# Patient Record
Sex: Female | Born: 1950
Health system: Southern US, Community
[De-identification: ages and names within clinical notes are randomized; demographics above are authoritative.]

## PROBLEM LIST (undated history)

## (undated) DIAGNOSIS — E785 Hyperlipidemia, unspecified: Secondary | ICD-10-CM

## (undated) DIAGNOSIS — I10 Essential (primary) hypertension: Secondary | ICD-10-CM

## (undated) DIAGNOSIS — G8929 Other chronic pain: Secondary | ICD-10-CM

## (undated) DIAGNOSIS — M5432 Sciatica, left side: Secondary | ICD-10-CM

## (undated) DIAGNOSIS — M5416 Radiculopathy, lumbar region: Secondary | ICD-10-CM

## (undated) DIAGNOSIS — M48061 Spinal stenosis, lumbar region without neurogenic claudication: Secondary | ICD-10-CM

## (undated) HISTORY — DX: Hyperlipidemia, unspecified: E78.5

## (undated) HISTORY — DX: Other chronic pain: G89.29

## (undated) HISTORY — DX: Essential (primary) hypertension: I10

## (undated) HISTORY — DX: Sciatica, left side: M54.32

## (undated) HISTORY — DX: Spinal stenosis, lumbar region without neurogenic claudication: M48.061

## (undated) HISTORY — DX: Radiculopathy, lumbar region: M54.16

---

## 2004-12-12 ENCOUNTER — Ambulatory Visit: Payer: Self-pay | Admitting: Obstetrics and Gynecology

## 2004-12-29 ENCOUNTER — Ambulatory Visit: Payer: Self-pay | Admitting: Obstetrics and Gynecology

## 2006-01-14 ENCOUNTER — Ambulatory Visit: Payer: Self-pay | Admitting: Internal Medicine

## 2007-03-24 ENCOUNTER — Ambulatory Visit: Payer: Self-pay | Admitting: Internal Medicine

## 2008-03-27 ENCOUNTER — Ambulatory Visit: Payer: Self-pay | Admitting: Internal Medicine

## 2008-08-03 ENCOUNTER — Ambulatory Visit: Payer: Self-pay | Admitting: Unknown Physician Specialty

## 2008-08-03 LAB — HM COLONOSCOPY

## 2009-04-04 ENCOUNTER — Ambulatory Visit: Payer: Self-pay | Admitting: Family Medicine

## 2009-04-15 ENCOUNTER — Ambulatory Visit: Payer: Self-pay | Admitting: Family Medicine

## 2009-04-15 LAB — HM MAMMOGRAPHY

## 2009-07-08 DIAGNOSIS — I10 Essential (primary) hypertension: Secondary | ICD-10-CM | POA: Insufficient documentation

## 2010-05-28 ENCOUNTER — Ambulatory Visit: Payer: Self-pay | Admitting: Family Medicine

## 2012-10-29 ENCOUNTER — Ambulatory Visit: Payer: Self-pay | Admitting: General Practice

## 2012-11-15 ENCOUNTER — Ambulatory Visit: Payer: Self-pay | Admitting: Unknown Physician Specialty

## 2012-11-15 DIAGNOSIS — I10 Essential (primary) hypertension: Secondary | ICD-10-CM

## 2012-11-18 ENCOUNTER — Ambulatory Visit: Payer: Self-pay | Admitting: General Practice

## 2013-07-12 ENCOUNTER — Ambulatory Visit: Payer: Self-pay | Admitting: General Practice

## 2013-07-12 DIAGNOSIS — I10 Essential (primary) hypertension: Secondary | ICD-10-CM

## 2013-07-12 LAB — URINALYSIS, COMPLETE
BILIRUBIN, UR: NEGATIVE
Blood: NEGATIVE
GLUCOSE, UR: NEGATIVE mg/dL (ref 0–75)
Hyaline Cast: 41
KETONE: NEGATIVE
NITRITE: NEGATIVE
Ph: 5 (ref 4.5–8.0)
Protein: NEGATIVE
SPECIFIC GRAVITY: 1.019 (ref 1.003–1.030)
Squamous Epithelial: 3
Transitional Epi: 2
WBC UR: 27 /HPF (ref 0–5)

## 2013-07-12 LAB — CBC
HCT: 44.5 % (ref 35.0–47.0)
HGB: 14.7 g/dL (ref 12.0–16.0)
MCH: 32.4 pg (ref 26.0–34.0)
MCHC: 33 g/dL (ref 32.0–36.0)
MCV: 98 fL (ref 80–100)
Platelet: 301 10*3/uL (ref 150–440)
RBC: 4.53 10*6/uL (ref 3.80–5.20)
RDW: 14 % (ref 11.5–14.5)
WBC: 9.8 10*3/uL (ref 3.6–11.0)

## 2013-07-12 LAB — BASIC METABOLIC PANEL
ANION GAP: 6 — AB (ref 7–16)
BUN: 17 mg/dL (ref 7–18)
CALCIUM: 9.7 mg/dL (ref 8.5–10.1)
CHLORIDE: 106 mmol/L (ref 98–107)
CO2: 29 mmol/L (ref 21–32)
Creatinine: 1.03 mg/dL (ref 0.60–1.30)
GFR CALC NON AF AMER: 58 — AB
Glucose: 87 mg/dL (ref 65–99)
Osmolality: 282 (ref 275–301)
Potassium: 4.4 mmol/L (ref 3.5–5.1)
SODIUM: 141 mmol/L (ref 136–145)

## 2013-07-12 LAB — APTT: Activated PTT: 26.8 secs (ref 23.6–35.9)

## 2013-07-12 LAB — MRSA PCR SCREENING

## 2013-07-12 LAB — PROTIME-INR
INR: 0.9
Prothrombin Time: 12.3 secs (ref 11.5–14.7)

## 2013-07-12 LAB — SEDIMENTATION RATE: Erythrocyte Sed Rate: 13 mm/hr (ref 0–30)

## 2013-07-14 LAB — URINE CULTURE

## 2013-07-24 ENCOUNTER — Inpatient Hospital Stay: Payer: Self-pay | Admitting: General Practice

## 2013-07-24 HISTORY — PX: REPLACEMENT TOTAL KNEE: SUR1224

## 2013-07-25 LAB — BASIC METABOLIC PANEL
ANION GAP: 6 — AB (ref 7–16)
BUN: 16 mg/dL (ref 7–18)
CALCIUM: 8.1 mg/dL — AB (ref 8.5–10.1)
CHLORIDE: 107 mmol/L (ref 98–107)
CREATININE: 0.88 mg/dL (ref 0.60–1.30)
Co2: 23 mmol/L (ref 21–32)
EGFR (African American): >60
GLUCOSE: 127 mg/dL — AB (ref 65–99)
OSMOLALITY: 275 (ref 275–301)
Potassium: 4.1 mmol/L (ref 3.5–5.1)
SODIUM: 136 mmol/L (ref 136–145)

## 2013-07-25 LAB — PLATELET COUNT: PLATELETS: 193 10*3/uL (ref 150–440)

## 2013-07-25 LAB — HEMOGLOBIN: HGB: 11.4 g/dL — ABNORMAL LOW (ref 12.0–16.0)

## 2013-07-26 LAB — BASIC METABOLIC PANEL
Anion Gap: 6 — ABNORMAL LOW (ref 7–16)
BUN: 11 mg/dL (ref 7–18)
CHLORIDE: 104 mmol/L (ref 98–107)
CREATININE: 0.6 mg/dL (ref 0.60–1.30)
Calcium, Total: 8.1 mg/dL — ABNORMAL LOW (ref 8.5–10.1)
Co2: 25 mmol/L (ref 21–32)
EGFR (African American): >60
EGFR (Non-African Amer.): >60
Glucose: 105 mg/dL — ABNORMAL HIGH (ref 65–99)
Osmolality: 270 (ref 275–301)
Potassium: 4.4 mmol/L (ref 3.5–5.1)
Sodium: 135 mmol/L — ABNORMAL LOW (ref 136–145)

## 2013-07-26 LAB — PLATELET COUNT: Platelet: 200 10*3/uL (ref 150–440)

## 2013-07-26 LAB — HEMOGLOBIN: HGB: 10.7 g/dL — ABNORMAL LOW (ref 12.0–16.0)

## 2014-03-09 LAB — HEMOGLOBIN A1C: Hgb A1c MFr Bld: 5.9 % (ref 4.0–6.0)

## 2014-03-09 LAB — BASIC METABOLIC PANEL
BUN: 13 mg/dL (ref 4–21)
Creatinine: 0.7 mg/dL (ref <=1.1)
GLUCOSE: 126 mg/dL
POTASSIUM: 4.3 mmol/L (ref 3.4–5.3)
SODIUM: 139 mmol/L (ref 137–147)

## 2014-05-03 LAB — LIPID PANEL
Cholesterol: 249 mg/dL — AB (ref 0–200)
HDL: 61 mg/dL (ref 35–70)
LDL CALC: 126 mg/dL
TRIGLYCERIDES: 311 mg/dL — AB (ref 40–160)

## 2014-05-03 LAB — HEPATIC FUNCTION PANEL: ALT: 13 U/L (ref 7–35)

## 2014-05-18 NOTE — Op Note (Signed)
PATIENT NAME:  Angela Winters, Angela Winters MR#:  [id number] DATE OF BIRTH:  1950/07/31  DATE OF PROCEDURE:  11/18/2012  PREOPERATIVE DIAGNOSIS: Internal derangement of the left knee.   POSTOPERATIVE DIAGNOSES: 1.  Tear of the posterior horn medial meniscus, left knee.  2.  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   PROCEDURE PERFORMED: Left knee arthroscopy, partial medial meniscectomy and chondroplasty of the medial and lateral compartments.   SURGEON: Laurice Record. Holley Bouche., M.D.   ANESTHESIA: General.   ESTIMATED BLOOD LOSS: Minimal.   FLUIDS REPLACED: 1000 mL of crystalloid.   TOURNIQUET TIME: Not used.   DRAINS: None.   INDICATIONS FOR SURGERY: The patient is a 64 year old female who has been seen for planes of progressive left knee pain. MRI demonstrated findings consistent with meniscal pathology, as well as some thinning of the articular cartilage. After discussion of the risks and benefits of surgical intervention, the patient expressed understanding of the risks and benefits and agreed with plans for surgical intervention.   PROCEDURE IN DETAIL: The patient was brought to the operating room and, after adequate general anesthesia was achieved, a tourniquet was placed on the patient's left thigh. The leg was placed in a leg holder and all bony prominences were well padded. The patient's left knee and leg were cleaned and prepped with alcohol and DuraPrep. draped in the usual sterile fashion. A "timeout" was performed as per usual protocol. The anticipated portal sites were injected with 0.25% Marcaine with epinephrine. An anterolateral portal was created and a cannula was inserted. A moderate effusion was evacuated. The scope was inserted and the knee was distended with fluid using the Stryker pump. The scope was advanced down the medial gutter into the medial compartment of the knee. Under  visualization with the scope, an anteromedial portal was created and hook probe was inserted. Inspection of the medial compartment demonstrated degenerative tear involving the posterior horn of the medial meniscus. The tear was debrided using meniscal punches and a 4.5 mm incisor shaver. Final contouring was performed using an ArthroCare wand. Inspection of the articular cartilage demonstrated grade 4 chondromalacia involving portion of the medial tibial plateau as well as the medial aspect of the medial femoral condyle. The margins were debrided and contoured using the ArthroCare wand. The scope was then advanced into the intracondylar region. The anterior cruciate ligament was visualized and probed and felt to be stable. The scope was removed from the anterolateral portal and reinserted via the anteromedial portal so as to better visualize the lateral compartment. The lateral meniscus was visualized and probed and felt to stable. The articular cartilage was inspected and there was an area of grade 3 chondromalacia involving a localized area of the lateral tibial plateau. This area was debrided and contoured using the ArthroCare wand. Finally, the scope was positioned so as to visualize the patellofemoral articulation. Good patellar tracking was noted. The articular surface was in reasonably good condition.   The knee was irrigated with copious amounts of fluid and then suctioned dry. The anterolateral portal was reapproximated using #3-0 nylon. A combination of 0.25% Marcaine with epinephrine and 4 mg morphine was injected via the scope. The scope was removed and the anteromedial portal was reapproximated using #3-0 nylon. Sterile dressing was applied followed by application of an ice wrap. The patient tolerated the procedure well. She was transported to the recovery room in stable condition.  ____________________________ Laurice Record. Holley Bouche., MD jph:NTS D: 11/18/2012 23:06:25 ET T: 11/19/2012 00:00:59  ET JOB#: [id number]  cc: Laurice Record. Holley Bouche., MD, <Dictator> Laurice Record Holley Bouche MD ELECTRONICALLY SIGNED 11/19/2012 11:41

## 2014-05-19 NOTE — Discharge Summary (Signed)
PATIENT NAME:  Angela Winters, Angela Winters MR#:  [id number] DATE OF BIRTH:  08/13/1950  DATE OF ADMISSION:  07/24/2013 DATE OF DISCHARGE:  07/27/2013  ADMITTING DIAGNOSIS: Degenerative arthrosis of the left knee.   DISCHARGE DIAGNOSIS: Degenerative arthrosis of the left knee.   HISTORY: The patient is a 64 year old female who has been followed at [facility] for progression of left knee discomfort. She has undergone arthroscopic surgery for medial meniscectomy as well as chondroplasty in October 2001. Following surgery she was placed on anti-inflammatories, Tylenol and ice heat as well as undergone a series of Synvisc injections. She had not seen any improvement in her condition. She had also modified her activities of daily living because of the increased pain. She states that the pain had increased to the point that it was significantly interfering with her activities of daily living. X-rays taken in [place] showed a tricompartmental fashion of degenerative changes with relative varus alignment. After discussion of the risks and benefits of surgical intervention, the patient expressed her understanding of the risks and benefits and agreed with plans for surgical intervention.   PROCEDURE: Left total knee arthroplasty using computer-assisted navigation.   ANESTHESIA: Spinal.   SOFT TISSUE RELEASE: Anterior cruciate ligament, posterior cruciate ligament, deep and superficial medial collateral ligament, and patellofemoral ligament.   IMPLANTS UTILIZED: DePuy PFC Sigma size 3 posterior stabilized femoral component (cemented), size 3 MBT tibial component (cemented), 35 mm three-pegged oval dome patella (cemented), and a 12.5 mm stabilized rotating platform polyethylene.   HOSPITAL COURSE: The patient tolerated the procedure very well. She had no complications. She was then taken to the PAC-U where she was stabilized and then transferred to the orthopedic floor. She began receiving  anticoagulation therapy of Lovenox 30 mg subcu every 12 hours per anesthesia and pharmacy protocol. She was fitted with TED stockings bilaterally. These were allowed to be removed 1 hour per 8 hour shift. She was also fitted with the AV-I compression foot pumps bilaterally set at 80 mmHg. Her calves have been nontender. There has been no evidence of any DVTs. Negative Homans sign. Heels were elevated off the bed using rolled towels.   The patient has denied any chest pain or shortness of breath. Vital signs have been stable. She has been afebrile. Hemodynamically she was stable and no transfusions were given other than the Autovac transfusions given the first 6 hours postoperatively.   Physical therapy was initiated on day 1 for gait training and transfers. She has done extremely well. Upon being discharged was ambulating greater than 500 feet. She had actually even gone to using a single-point cane for ambulation. She was able to go up and down 4 steps. She was independent with bed to chair transfers. She was actually walking around her room independently. Occupational therapy was also initiated on day 1 for ADL and assistive devices.   The patient's IV, Foley and Hemovac were DC'd on day 2 along with a dressing change. The wound was free of any drainage or any signs of infection. Polar Care was reapplied to the surgical leg maintaining a temperature of 40 to 50 degrees Fahrenheit.   DRUG ALLERGIES: CODEINE.   DISPOSITION: The patient is discharged to home in improved stable condition.   DISCHARGE INSTRUCTIONS: She may weight bear as tolerated. Continue using a walker until cleared by physical therapy to go to a quad cane. She will receive home health PT. Continue with TED stockings. These are to be worn during the day but may be  removed at night.  Also continue with Polar Care maintaining a temperature of 40 to 50 degrees Fahrenheit. I have asked her to use this around-the-clock for the first 2 weeks.  She is placed on a regular diet. She is instructed on wound care. She is not to get the wound wet until staples are removed in 2 weeks.  She has a follow-up appointment on July 14 at 9:15. She is to call sooner for any temperatures of 101.5 or greater or excessive bleeding.   DISCHARGE MEDICATIONS: The patient was placed on oxycodone 5 to 10 mg every 4 to 6 hours p.r.n. for pain, Ultram 50 to 100 mg every 4 to 6 hours p.r.n. for pain, as well as Lovenox 40 mg subcu daily for 14 days and then discontinue and begin taking one 81 mg enteric-coated aspirin. She may resume her regular medication that she was on prior to admission.   PAST MEDICAL HISTORY: 1.  Hypertension.  2.  Hyperlipidemia.  3.  Anxiety.   ____________________________ Vance Peper, PA jrw:sb D: 07/27/2013 07:10:33 ET T: 07/27/2013 11:00:09 ET JOB#: [id number]  cc: Vance Peper, PA, <Dictator> Dali Kraner PA ELECTRONICALLY SIGNED 08/02/2013 7:23

## 2014-05-19 NOTE — Op Note (Signed)
PATIENT NAME:  Angela Winters, Angela Winters MR#:  [id number] DATE OF BIRTH:  August 19, 1950  DATE OF PROCEDURE:  07/24/2013  PREOPERATIVE DIAGNOSIS: Degenerative arthrosis of the left knee.   POSTOPERATIVE DIAGNOSIS: Degenerative arthrosis of the left knee.   PROCEDURE PERFORMED: Left total knee arthroplasty using computer assisted navigation..   SURGEON: Skip Estimable, M.D.   ASSISTANT: Vance Peper, PA (required to maintain retraction throughout the procedure).   ANESTHESIA: Spinal.   ESTIMATED BLOOD LOSS: 50 mL.   FLUIDS REPLACED: 800 mL of crystalloid.   TOURNIQUET TIME: 72 minutes.   DRAINS: Two medium drains to reinfusion system.   SOFT TISSUE RELEASES: Anterior cruciate ligament, posterior cruciate ligament, deep and superficial medial collateral ligament, and patellofemoral ligament.   IMPLANTS UTILIZED: DePuy PFC Sigma size 3 posterior stabilized femoral component (cemented), size 3 MBT tibial component (cemented), 35 mm three-peg oval dome patella (cemented), and a 12.5 mm stabilized rotating platform polyethylene insert.   INDICATIONS FOR SURGERY: The patient is a 64 year old female who has been seen for complaints of progressive left knee pain. X-rays demonstrated degenerative changes in tricompartmental fashion with relative varus deformity. After discussion of the risks and benefits of surgical intervention, the patient expressed understanding of the risks and benefits and agreed with plans for surgical intervention.   PROCEDURE IN DETAIL: The patient was brought to the operating room and, after adequate spinal anesthesia was achieved, a tourniquet was placed on the patient's upper left thigh. The patient's left knee and leg were cleaned and prepped with alcohol and DuraPrep and draped in the usual sterile fashion. A "timeout" was performed as per usual protocol. The left lower extremity was exsanguinated using an Esmarch, and the tourniquet was inflated to 300 mmHg. An anterior longitudinal  incision was made followed by a standard mid vastus approach. A moderate effusion was evacuated. The deep fibers of the medial collateral ligament were elevated in subperiosteal fashion off the medial flare of the tibia so as to maintain a continuous soft tissue sleeve. The patella was subluxed laterally and the patellofemoral ligament was incised. Inspection of the knee demonstrated severe degenerative changes, most notably to the medial compartment. Prominent osteophytes were debrided using a rongeur. The anterior and posterior cruciate ligaments were excised. Two 4.0 mm Schanz pins were inserted into the femur and into the tibia for attachment of the array of trackers used for computer-assisted navigation. Hip center was identified using a circumduction technique. Distal landmarks were mapped using the computer. The distal femur and proximal tibia were mapped using the computer. Distal femoral cutting guide was positioned using computer-assisted navigation so as to achieve a 5 degree distal valgus cut. Cut was performed and verified using the computer. Distal femur was sized and it was felt that a size 3 femoral component was appropriate. A size 3 cutting guide was positioned and the cut was performed and verified using the computer. This was followed by completion of the posterior and chamfer cuts. Femoral cutting guide for the central box was then positioned, and the central box cut was performed. Attention was then directed to the proximal tibia. Medial and lateral menisci were excised. The extramedullary tibial cutting guide was positioned using computer-assisted navigation so as to achieve a 0 degree varus valgus alignment and 0 degree posterior slope. Cut was performed and verified using the computer. The proximal tibia was sized and it was felt that a size 3 tibial tray was appropriate. Tibial and femoral trials were inserted followed by insertion of a 10 mm polyethylene insert.  The knee was felt to be  tight medially. A Cobb elevator was used to elevate the superficial fibers of the medial collateral ligament. The 10 mm polyethylene trial was replaced with a 12.5 mm polyethylene. This allowed for excellent mediolateral soft tissue balancing. Finally, the patella was cut and prepared so as to accommodate a 35 mm three-peg oval dome patella. Patellar trial was placed and the knee was placed through a range of motion with excellent patellar tracking appreciated. The femoral trials were removed. Central post hole for the tibial component was reamed followed by insertion of a keel punch. Tibial trial was then removed. The cut surfaces of bone were irrigated with copious amounts of normal saline with antibiotic solution using pulsatile lavage and then suctioned dry. Polymethyl methacrylate cement was prepared in the usual fashion using a vacuum mixer. Cement was applied to the cut surface of the proximal tibia as well as along the undersurface of a size 3 MBT tibial component. The tibial component was positioned and impacted into place. Excess cement was removed using [employer] fast streamer. Cement was then applied to the cut surface of the femur as well as on the posterior flanges of a size 3 posterior stabilized femoral component. Femoral component was positioned and impacted into place. Excess cement was removed using freer elevators. A 12.5 mm polyethylene trial was inserted and the knee was brought into full extension with steady axial compression applied. Finally, cement was applied to the backside of a 35 mm three-peg oval dome patella and the patellar component was positioned and patellar clamp applied. Excess cement was removed using [employer] fast streamer.   After adequate curing of the cement, the tourniquet was deflated after total tourniquet time of 72 minutes. Hemostasis was achieved using electrocautery. The knee was irrigated with copious amounts of normal saline with antibiotic solution using pulsatile lavage and  then suctioned dry. The knee was inspected for any residual cement debris. Then 20 mL of 1.3% Exparel in 40 mL of normal saline was injected along the posterior capsule, medial and lateral gutters, and along the arthrotomy site. A 12.5 mm stabilized rotating platform polyethylene insert was inserted and the knee was placed through a range of motion with excellent mediolateral soft tissue balancing and excellent patellar tracking appreciated. Two medium drains were placed in the wound bed and brought out through a separate stab incision to be attached to a reinfusion system. The medial parapatellar portion of the incision was reapproximated using interrupted sutures of #1 Vicryl. The subcutaneous tissue was approximated in layers using first #0 Vicryl followed by #2-0 Vicryl. Then 30 mL of 0.25% Marcaine with epinephrine was injected into the subcutaneous tissue along the incision site. Skin was then approximated using skin staples. A sterile dressing was applied.   The patient tolerated the procedure well. She was transported to the recovery room in stable condition.   ____________________________ Laurice Record. Holley Bouche., MD jph:sb D: 07/25/2013 00:47:14 ET T: 07/25/2013 07:32:33 ET JOB#: [id number]  cc: Laurice Record. Holley Bouche., MD, <Dictator> JAMES P Holley Bouche MD ELECTRONICALLY SIGNED 07/27/2013 7:47

## 2014-07-08 DIAGNOSIS — Z96659 Presence of unspecified artificial knee joint: Secondary | ICD-10-CM | POA: Insufficient documentation

## 2014-07-10 LAB — HM COLONOSCOPY

## 2014-07-25 DIAGNOSIS — M5416 Radiculopathy, lumbar region: Secondary | ICD-10-CM | POA: Insufficient documentation

## 2014-07-25 DIAGNOSIS — R7303 Prediabetes: Secondary | ICD-10-CM | POA: Insufficient documentation

## 2014-07-25 DIAGNOSIS — J309 Allergic rhinitis, unspecified: Secondary | ICD-10-CM | POA: Insufficient documentation

## 2014-07-25 DIAGNOSIS — M5432 Sciatica, left side: Secondary | ICD-10-CM | POA: Insufficient documentation

## 2014-07-25 DIAGNOSIS — R739 Hyperglycemia, unspecified: Secondary | ICD-10-CM | POA: Insufficient documentation

## 2014-07-25 DIAGNOSIS — E119 Type 2 diabetes mellitus without complications: Secondary | ICD-10-CM | POA: Insufficient documentation

## 2014-07-25 DIAGNOSIS — E1169 Type 2 diabetes mellitus with other specified complication: Secondary | ICD-10-CM | POA: Insufficient documentation

## 2014-08-02 ENCOUNTER — Encounter: Payer: Self-pay | Admitting: Family Medicine

## 2014-08-02 ENCOUNTER — Ambulatory Visit (INDEPENDENT_AMBULATORY_CARE_PROVIDER_SITE_OTHER): Payer: Medicare Other | Admitting: Family Medicine

## 2014-08-02 VITALS — BP 120/70 | HR 70 | Temp 98.4°F | Resp 16 | Ht 67.0 in | Wt 184.0 lb

## 2014-08-02 DIAGNOSIS — R739 Hyperglycemia, unspecified: Secondary | ICD-10-CM

## 2014-08-02 DIAGNOSIS — I10 Essential (primary) hypertension: Secondary | ICD-10-CM

## 2014-08-02 DIAGNOSIS — H539 Unspecified visual disturbance: Secondary | ICD-10-CM

## 2014-08-02 DIAGNOSIS — G44219 Episodic tension-type headache, not intractable: Secondary | ICD-10-CM

## 2014-08-02 DIAGNOSIS — E7800 Pure hypercholesterolemia, unspecified: Secondary | ICD-10-CM

## 2014-08-02 NOTE — Progress Notes (Signed)
Patient: Angela Winters Female    DOB: [birth year]   64 y.o.   MRN: [id number] Visit Date: 08/02/2014  Today's Provider: Lelon Huh, MD   Chief Complaint  Patient presents with  . Hypertension    follow up  . Hyperlipidemia    follow up  . Hyperglycemia    follow up   Subjective:    HPI  Lipid/Cholesterol, Follow-up:   Last seen for this3 months ago.  Management changes since that visit include  Increasing Pravastatin to 80mg  daily. . Last Lipid Panel:    Component Value Date/Time   CHOL 249* 05/03/2014   TRIG 311* 05/03/2014   HDL 61 05/03/2014   LDLCALC 126 05/03/2014    Risk factors for vascular disease include hypercholesterolemia, hypertension and smoking  She reports good compliance with treatment. She is not having side effects.  Current symptoms include none and have been stable. Weight trend: stable Prior visit with dietician: no Current diet: Unhealthy Current exercise: none  Wt Readings from Last 3 Encounters:  08/02/14 184 lb (83.462 kg)  05/03/14 184 lb (83.462 kg)    -------------------------------------------------------------------  Hypertension, follow-up:  BP Readings from Last 3 Encounters:  08/02/14 120/70  05/03/14 124/60    She was last seen for hypertension 5 months ago.  BP at that visit was 110/60. Management changes since that visit include  none. She reports good compliance with treatment. She is not having side effects.  She is not exercising. She is not adherent to low salt diet.   Outside blood pressures are not taken. She is experiencing none.  Patient denies chest pain, chest pressure/discomfort, claudication, dyspnea, exertional chest pressure/discomfort, fatigue, irregular heart beat, lower extremity edema, near-syncope, orthopnea, palpitations, paroxysmal nocturnal dyspnea, syncope and tachypnea.   Cardiovascular risk factors include hypertension and smoking/ tobacco exposure.  Use of agents associated with  hypertension: none.     Weight trend: stable Wt Readings from Last 3 Encounters:  08/02/14 184 lb (83.462 kg)  05/03/14 184 lb (83.462 kg)    Current diet: in general, an "unhealthy" diet  ------------------------------------------------------------------------   Hyperglycemia Follow up:   Last office visit was 03/09/2014 and no changes were made. Patient states she has had trouble watching her diet due to holiday celebrations and special occasions. Patient states her last eye exam, was 5 years ago. Patient  has had trouble focusing in her left eye. Patient states she has had 2-3 episode of this in the past 3 months.      Previous Medications   ASPIRIN 81 MG TABLET    Take by mouth.   BISOPROLOL (ZEBETA) 10 MG TABLET    Take by mouth.   CHOLECALCIFEROL (VITAMIN D) 1000 UNITS TABLET    VITAMIN D, 1000UNIT (Oral Tablet)  1 Every Day for 0 days  Quantity: 0.00;  Refills: 0   Ordered :09-Jan-2010  Darlin Priestly ;  Started 11-Sep-2008 Active   CITALOPRAM (CELEXA) 40 MG TABLET    Take by mouth.   LISINOPRIL (PRINIVIL,ZESTRIL) 10 MG TABLET    Take by mouth.   MULTIPLE VITAMIN PO    1 tablet daily.    PRAVASTATIN (PRAVACHOL) 80 MG TABLET    Take by mouth.    Review of Systems  Constitutional: Negative for fever, chills and fatigue.  HENT: Negative for ear pain.   Eyes: Positive for visual disturbance (left eye has touble focusing for the past 3 months; occured 2-3 times).  Neurological: Positive for headaches (on the left  side of back of her head occasionally, not related to visual disturbances).  All other systems reviewed and are negative.   History  Substance Use Topics  . Smoking status: Current Every Day Smoker -- 1.00 packs/day for 50 years  . Smokeless tobacco: Not on file  . Alcohol Use: Yes     Comment: Occasional alcohol use   Objective:   BP 120/70 mmHg  Pulse 70  Temp(Src) 98.4 F (36.9 C) (Oral)  Resp 16  Ht 5\' 7"  (1.702 m)  Wt 184 lb (83.462 kg)  BMI 28.81  kg/m2  SpO2 95%  Physical Exam   General Appearance:    Alert, cooperative, no distress  Eyes:    PERRL, conjunctiva/corneas clear, EOM's intact       Lungs:     Clear to auscultation bilaterally, respirations unlabored  Heart:    Regular rate and rhythm  Neurologic:   Awake, alert, oriented x 3. No apparent focal neurological           defect.            Assessment & Plan:     1. Hyperglycemia Not following diet well, counseled on improving diet.  - Hemoglobin A1c  2. Hypercholesterolemia without hypertriglyceridemia She is tolerating increased dose of pravastatin well with no adverse effects.   - Lipid panel - Hepatic function panel  3. Essential (primary) hypertension well controlled Continue current medications.    4. Visual disturbances  - Ambulatory referral to Ophthalmology  5. Episodic tension-type headache, not intractable Try OC Tylenol and/or cool compress. Call if they become more frequent or change in character.      Lelon Huh, MD  [facility]

## 2014-08-03 LAB — HEPATIC FUNCTION PANEL
ALBUMIN: 4.1 g/dL (ref 3.6–4.8)
ALT: 13 IU/L (ref 0–32)
AST: 16 IU/L (ref 0–40)
Alkaline Phosphatase: 80 IU/L (ref 39–117)
BILIRUBIN TOTAL: 0.2 mg/dL (ref 0.0–1.2)
BILIRUBIN, DIRECT: 0.07 mg/dL (ref 0.00–0.40)
TOTAL PROTEIN: 6.8 g/dL (ref 6.0–8.5)

## 2014-08-03 LAB — LIPID PANEL
Chol/HDL Ratio: 3.8 ratio units (ref 0.0–4.4)
Cholesterol, Total: 229 mg/dL — ABNORMAL HIGH (ref 100–199)
HDL: 60 mg/dL (ref >39)
LDL Calculated: 116 mg/dL — ABNORMAL HIGH (ref 0–99)
Triglycerides: 267 mg/dL — ABNORMAL HIGH (ref 0–149)
VLDL Cholesterol Cal: 53 mg/dL — ABNORMAL HIGH (ref 5–40)

## 2014-08-03 LAB — HEMOGLOBIN A1C
Est. average glucose Bld gHb Est-mCnc: 123 mg/dL
Hgb A1c MFr Bld: 5.9 % — ABNORMAL HIGH (ref 4.8–5.6)

## 2014-08-08 ENCOUNTER — Other Ambulatory Visit: Payer: Self-pay | Admitting: Family Medicine

## 2014-08-08 ENCOUNTER — Encounter: Payer: Self-pay | Admitting: Family Medicine

## 2014-08-08 DIAGNOSIS — Z87891 Personal history of nicotine dependence: Secondary | ICD-10-CM

## 2014-08-08 DIAGNOSIS — F17210 Nicotine dependence, cigarettes, uncomplicated: Secondary | ICD-10-CM | POA: Insufficient documentation

## 2014-08-09 ENCOUNTER — Other Ambulatory Visit: Payer: Self-pay | Admitting: Family Medicine

## 2014-08-09 DIAGNOSIS — Z87891 Personal history of nicotine dependence: Secondary | ICD-10-CM

## 2014-08-10 ENCOUNTER — Inpatient Hospital Stay: Payer: Medicare Other | Attending: Family Medicine | Admitting: Family Medicine

## 2014-08-10 ENCOUNTER — Ambulatory Visit
Admission: RE | Admit: 2014-08-10 | Discharge: 2014-08-10 | Disposition: A | Discharge status: Home / Self Care | Payer: Medicare Other | Source: Ambulatory Visit | Attending: Family Medicine | Admitting: Family Medicine

## 2014-08-10 DIAGNOSIS — D3502 Benign neoplasm of left adrenal gland: Secondary | ICD-10-CM | POA: Diagnosis not present

## 2014-08-10 DIAGNOSIS — Z87891 Personal history of nicotine dependence: Secondary | ICD-10-CM | POA: Insufficient documentation

## 2014-08-10 DIAGNOSIS — Z122 Encounter for screening for malignant neoplasm of respiratory organs: Secondary | ICD-10-CM

## 2014-08-10 DIAGNOSIS — I2510 Atherosclerotic heart disease of native coronary artery without angina pectoris: Secondary | ICD-10-CM | POA: Insufficient documentation

## 2014-08-10 NOTE — Progress Notes (Signed)
In accordance with CMS guidelines, patient has meet eligibility criteria including age, absence of signs or symptoms of lung cancer, the specific calculation of cigarette smoking pack-years was 35 years and is a current smoker.   A shared decision-making session was conducted prior to the performance of CT scan. This includes one or more decision aids, includes benefits and harms of screening, follow-up diagnostic testing, over-diagnosis, false positive rate, and total radiation exposure.  Counseling on the importance of adherence to annual lung cancer LDCT screening, impact of co-morbidities, and ability or willingness to undergo diagnosis and treatment is imperative for compliance of the program.  Counseling on the importance of continued smoking cessation for former smokers; the importance of smoking cessation for current smokers and information about tobacco cessation interventions have been given to patient including the [place] at [facility], 1800 quit [place], as well as [place] specific smoking cessation programs.  Written order for lung cancer screening with LDCT has been given to the patient and any and all questions have been answered to the best of my abilities.   Yearly follow up will be scheduled by Burgess Estelle, Thoracic Navigator.

## 2014-08-13 ENCOUNTER — Telehealth: Payer: Self-pay | Admitting: *Deleted

## 2014-08-13 NOTE — Telephone Encounter (Signed)
  Oncology Nurse Navigator Documentation    Navigator Encounter Type: Telephone;Screening (08/13/14 1300)               Notified patient of LDCT lung cancer screening results of Lung Rads 1  finding with recommendation for 12 month follow up imaging. Also notified of incidental finding noted below. Patient verbalizes understanding.   IMPRESSION: 1. Lung-RADS Category 1, negative. Continue annual screening with low-dose chest CT without contrast in 12 months. 2. Coronary artery calcification. 3. Left adrenal adenoma

## 2014-08-20 ENCOUNTER — Telehealth: Payer: Self-pay | Admitting: Emergency Medicine

## 2014-08-20 NOTE — Telephone Encounter (Signed)
-----   Message from Birdie Sons, MD sent at 08/18/2014  8:22 PM EDT ----- Regarding: Lung CT Lung CT is normal. Repeat yearly.

## 2014-08-20 NOTE — Telephone Encounter (Signed)
Pt informed

## 2014-08-20 NOTE — Telephone Encounter (Signed)
LMTCB

## 2014-09-25 ENCOUNTER — Other Ambulatory Visit: Payer: Self-pay | Admitting: Family Medicine

## 2014-10-03 ENCOUNTER — Other Ambulatory Visit: Payer: Self-pay | Admitting: Family Medicine

## 2014-10-06 ENCOUNTER — Other Ambulatory Visit: Payer: Self-pay | Admitting: Family Medicine

## 2014-12-17 ENCOUNTER — Other Ambulatory Visit: Payer: Self-pay | Admitting: *Deleted

## 2014-12-17 MED ORDER — PRAVASTATIN SODIUM 80 MG PO TABS: 80.0000 mg | ORAL_TABLET | Freq: Every day | ORAL | Status: DC

## 2014-12-17 NOTE — Telephone Encounter (Signed)
Requesting 90 day supply.

## 2015-01-07 ENCOUNTER — Other Ambulatory Visit: Payer: Self-pay | Admitting: *Deleted

## 2015-01-07 NOTE — Telephone Encounter (Signed)
Patient called back and scheduled ov appt for 01/30/2015.

## 2015-01-07 NOTE — Telephone Encounter (Signed)
Patient's husband Deidre Ala was notified that pt is due for f/u ov. Stated that he will have pt call back.

## 2015-01-08 MED ORDER — CITALOPRAM HYDROBROMIDE 40 MG PO TABS: 40.0000 mg | ORAL_TABLET | Freq: Every day | ORAL | Status: DC

## 2015-01-30 ENCOUNTER — Ambulatory Visit (INDEPENDENT_AMBULATORY_CARE_PROVIDER_SITE_OTHER): Payer: Medicare Other | Admitting: Family Medicine

## 2015-01-30 ENCOUNTER — Encounter: Payer: Self-pay | Admitting: Family Medicine

## 2015-01-30 VITALS — BP 112/66 | HR 60 | Temp 97.9°F | Resp 16 | Wt 186.0 lb

## 2015-01-30 DIAGNOSIS — Z23 Encounter for immunization: Secondary | ICD-10-CM | POA: Diagnosis not present

## 2015-01-30 DIAGNOSIS — I10 Essential (primary) hypertension: Secondary | ICD-10-CM | POA: Diagnosis not present

## 2015-01-30 DIAGNOSIS — R739 Hyperglycemia, unspecified: Secondary | ICD-10-CM | POA: Diagnosis not present

## 2015-01-30 LAB — POCT GLYCOSYLATED HEMOGLOBIN (HGB A1C)
ESTIMATED AVERAGE GLUCOSE: 128
HEMOGLOBIN A1C: 6.1

## 2015-01-30 NOTE — Progress Notes (Signed)
Patient: Angela Winters Female    DOB: Jun 08, 1950   65 y.o.   MRN: [id number] Visit Date: 01/30/2015  Today's Provider: Lelon Huh, MD   Chief Complaint  Patient presents with  . Hypertension    follow up  . Hyperglycemia    follow up  . Panic Attack    follow up   Subjective:    HPI   Hypertension, follow-up:  BP Readings from Last 3 Encounters:  01/30/15 112/66  08/02/14 120/70  05/03/14 124/60    She was last seen for hypertension 6 months ago.  BP at that visit was 120/70. Management since that visit includes no changes. She reports good compliance with treatment. She is not having side effects.  She is not exercising. She is not adherent to low salt diet.   Outside blood pressures are not being checked. She is experiencing none.  Patient denies chest pain, chest pressure/discomfort, claudication, dyspnea, exertional chest pressure/discomfort, fatigue, irregular heart beat, lower extremity edema, near-syncope, orthopnea, palpitations, paroxysmal nocturnal dyspnea, syncope and tachypnea.   Cardiovascular risk factors include advanced age (older than 88 for men, 32 for women), dyslipidemia, hypertension and sedentary lifestyle.  Use of agents associated with hypertension: NSAIDS.     Weight trend: stable Wt Readings from Last 3 Encounters:  01/30/15 186 lb (84.369 kg)  08/02/14 184 lb (83.462 kg)  05/03/14 184 lb (83.462 kg)    Current diet: in general, an "unhealthy" diet  ------------------------------------------------------------------------   Hyperglycemia, Follow-up:   Lab Results  Component Value Date   HGBA1C 5.9* 08/02/2014   HGBA1C 5.9 03/09/2014   GLUCOSE 105* 07/26/2013   GLUCOSE 127* 07/25/2013   GLUCOSE 87 07/12/2013    Last seen for for this 6 months ago.  Management since then includes recommend improving diet.  Has since stopped drinking soft drinks and trying to cut back on white breads.  Has started drinking unsweetened  tea.  Current symptoms include none and have been stable.  Weight trend: stable Prior visit with dietician: no Current diet: in general, an "unhealthy" diet Current exercise: none  Pertinent Labs:    Component Value Date/Time   CHOL 229* 08/02/2014 1008   CHOL 249* 05/03/2014   TRIG 267* 08/02/2014 1008   CHOLHDL 3.8 08/02/2014 1008   CREATININE 0.7 03/09/2014   CREATININE 0.60 07/26/2013 0530    Wt Readings from Last 3 Encounters:  01/30/15 186 lb (84.369 kg)  08/02/14 184 lb (83.462 kg)  05/03/14 184 lb (83.462 kg)   Follow up Panic disorder:  Last office visit was 6 months ago and no changes were made. Patient reports good compliance with treatment, good tolerance and good symptom control.      Allergies  Allergen Reactions  . Codeine Nausea Only   Previous Medications   ASPIRIN 81 MG TABLET    Take 81 mg by mouth daily.    BISOPROLOL (ZEBETA) 10 MG TABLET    Take 10 mg by mouth daily.    CHOLECALCIFEROL (VITAMIN D) 1000 UNITS TABLET    VITAMIN D, 1000UNIT (Oral Tablet)  1 Every Day for 0 days  Quantity: 0.00;  Refills: 0   Ordered :09-Jan-2010  Darlin Priestly ;  Started 11-Sep-2008 Active   CITALOPRAM (CELEXA) 40 MG TABLET    Take 1 tablet (40 mg total) by mouth daily.   LISINOPRIL (PRINIVIL,ZESTRIL) 10 MG TABLET    Take 10 mg by mouth daily.    MULTIPLE VITAMIN PO    1 tablet  daily.    PRAVASTATIN (PRAVACHOL) 80 MG TABLET    Take 1 tablet (80 mg total) by mouth daily.    Review of Systems  Constitutional: Negative for fever, chills, appetite change and fatigue.  Respiratory: Negative for chest tightness and shortness of breath.   Cardiovascular: Negative for chest pain and palpitations.  Gastrointestinal: Negative for nausea, vomiting and abdominal pain.  Neurological: Negative for dizziness and weakness.    Social History  Substance Use Topics  . Smoking status: Current Every Day Smoker -- 1.00 packs/day for 50 years  . Smokeless tobacco: Not on file  .  Alcohol Use: Yes     Comment: Occasional alcohol use   Objective:   BP 112/66 mmHg  Pulse 60  Temp(Src) 97.9 F (36.6 C) (Oral)  Resp 16  Wt 186 lb (84.369 kg)  Physical Exam   General Appearance:    Alert, cooperative, no distress  Eyes:    PERRL, conjunctiva/corneas clear, EOM's intact       Lungs:     Clear to auscultation bilaterally, respirations unlabored  Heart:    Regular rate and rhythm  Neurologic:   Awake, alert, oriented x 3. No apparent focal neurological           defect.        Depression screen [facility] 2/9 01/30/2015 01/30/2015  Decreased Interest 0 0  Down, Depressed, Hopeless 0 0  PHQ - 2 Score 0 0  Altered sleeping 0 -  Tired, decreased energy 0 -  Change in appetite 0 -  Feeling bad or failure about yourself  0 -  Trouble concentrating 0 -  Moving slowly or fidgety/restless 0 -  Suicidal thoughts 0 -  PHQ-9 Score 0 -  Difficult doing work/chores Not difficult at all -   Results for orders placed or performed in visit on 01/30/15  POCT glycosylated hemoglobin (Hb A1C)  Result Value Ref Range   Hemoglobin A1C 6.1    Est. average glucose Bld gHb Est-mCnc 128        Assessment & Plan:     1. Hyperglycemia Stable with dietary changes. Recheck A1c in 6 months.  - POCT glycosylated hemoglobin (Hb A1C)  2. Essential (primary) hypertension Well controlled.  Continue current medications.    3. Need for influenza vaccination  - Flu Vaccine QUAD 36+ mos IM  4. Need for pneumococcal vaccination  - Pneumococcal conjugate vaccine 13-valent IM       Lelon Huh, MD  [facility]

## 2015-04-05 ENCOUNTER — Encounter: Payer: Self-pay | Admitting: Family Medicine

## 2015-04-05 ENCOUNTER — Ambulatory Visit (INDEPENDENT_AMBULATORY_CARE_PROVIDER_SITE_OTHER): Payer: Medicare Other | Admitting: Family Medicine

## 2015-04-05 VITALS — BP 128/62 | HR 76 | Temp 98.5°F | Resp 18 | Wt 183.0 lb

## 2015-04-05 DIAGNOSIS — H6693 Otitis media, unspecified, bilateral: Secondary | ICD-10-CM | POA: Diagnosis not present

## 2015-04-05 DIAGNOSIS — J0100 Acute maxillary sinusitis, unspecified: Secondary | ICD-10-CM

## 2015-04-05 MED ORDER — AMOXICILLIN 500 MG PO CAPS: 1000.0000 mg | ORAL_CAPSULE | Freq: Two times a day (BID) | ORAL | Status: AC

## 2015-04-05 NOTE — Progress Notes (Signed)
Patient: Angela Winters Female    DOB: October 24, 1950   65 y.o.   MRN: [id number] Visit Date: 04/05/2015  Today's Provider: Lelon Huh, MD   Chief Complaint  Patient presents with  . URI   Subjective:    URI  This is a new problem. Episode onset: 8 days. The problem has been gradually worsening. There has been no fever. Associated symptoms include congestion, coughing (dry), ear pain (both ears), headaches, neck pain, a plugged ear sensation, rhinorrhea, sinus pain, sneezing and swollen glands. Pertinent negatives include no abdominal pain, chest pain, diarrhea, dysuria, joint pain, joint swelling, nausea, rash, sore throat, vomiting or wheezing. Treatments tried: OTC Severe sinus and Allergy medication. The treatment provided mild relief.  Yesterday she stared having pain in ears and has pain in her teeth .      Allergies  Allergen Reactions  . Codeine Nausea Only   Previous Medications   ASPIRIN 81 MG TABLET    Take 81 mg by mouth daily.    BISOPROLOL (ZEBETA) 10 MG TABLET    Take 10 mg by mouth daily.    CHOLECALCIFEROL (VITAMIN D) 1000 UNITS TABLET    VITAMIN D, 1000UNIT (Oral Tablet)  1 Every Day for 0 days  Quantity: 0.00;  Refills: 0   Ordered :09-Jan-2010  Darlin Priestly ;  Started 11-Sep-2008 Active   CITALOPRAM (CELEXA) 40 MG TABLET    Take 1 tablet (40 mg total) by mouth daily.   LISINOPRIL (PRINIVIL,ZESTRIL) 10 MG TABLET    Take 10 mg by mouth daily.    MULTIPLE VITAMIN PO    1 tablet daily.    PRAVASTATIN (PRAVACHOL) 80 MG TABLET    Take 1 tablet (80 mg total) by mouth daily.    Review of Systems  Constitutional: Negative for fever, chills, diaphoresis, appetite change and fatigue.  HENT: Positive for congestion, ear pain (both ears), rhinorrhea, sinus pressure and sneezing. Negative for ear discharge, mouth sores, nosebleeds, postnasal drip, sore throat and tinnitus.   Eyes: Positive for itching. Negative for pain, discharge and redness.  Respiratory:  Positive for cough (dry). Negative for chest tightness, shortness of breath and wheezing.   Cardiovascular: Negative for chest pain and palpitations.  Gastrointestinal: Negative for nausea, vomiting, abdominal pain and diarrhea.  Genitourinary: Negative for dysuria.  Musculoskeletal: Positive for neck pain. Negative for joint pain.  Skin: Negative for rash.  Neurological: Positive for dizziness and headaches. Negative for weakness.    Social History  Substance Use Topics  . Smoking status: Current Every Day Smoker -- 1.00 packs/day for 50 years  . Smokeless tobacco: Not on file  . Alcohol Use: Yes     Comment: Occasional alcohol use   Objective:   BP 128/62 mmHg  Pulse 76  Temp(Src) 98.5 F (36.9 C) (Oral)  Resp 18  Wt 183 lb (83.008 kg)  SpO2 97%  Physical Exam  General Appearance:    Alert, cooperative, no distress  HENT:   bilateral TM red, dull, bulging, neck without nodes, throat normal without erythema or exudate, frontal and maxillary sinus tender and nasal mucosa pale and congested  Eyes:    PERRL, conjunctiva/corneas clear, EOM's intact       Lungs:     Clear to auscultation bilaterally, respirations unlabored  Heart:    Regular rate and rhythm  Neurologic:   Awake, alert, oriented x 3. No apparent focal neurological           defect.  Assessment & Plan:     1. Bilateral acute otitis media, recurrence not specified, unspecified otitis media type  - amoxicillin (AMOXIL) 500 MG capsule; Take 2 capsules (1,000 mg total) by mouth 2 (two) times daily.  Dispense: 40 capsule; Refill: 0  2. Acute maxillary sinusitis, recurrence not specified  - amoxicillin (AMOXIL) 500 MG capsule; Take 2 capsules (1,000 mg total) by mouth 2 (two) times daily.  Dispense: 40 capsule; Refill: 0  Call if symptoms change or if not rapidly improving.          Lelon Huh, MD  [facility]

## 2015-04-17 ENCOUNTER — Other Ambulatory Visit: Payer: Self-pay | Admitting: Family Medicine

## 2015-05-19 ENCOUNTER — Other Ambulatory Visit: Payer: Self-pay | Admitting: Family Medicine

## 2015-07-31 ENCOUNTER — Other Ambulatory Visit: Payer: Self-pay | Admitting: Family Medicine

## 2015-07-31 ENCOUNTER — Telehealth: Payer: Self-pay | Admitting: *Deleted

## 2015-07-31 NOTE — Telephone Encounter (Signed)
Notified patient that annual lung cancer screening low dose CT scan is due. Confirmed that patient is within the age range of 55-77, and asymptomatic, (no signs or symptoms of lung cancer). Patient denies illness that would prevent curative treatment for lung cancer if found. The patient is a current smoker, with a 35.75 pack year history. The shared decision making visit was done 08/10/14 Patient is agreeable for CT scan being scheduled.

## 2015-08-05 ENCOUNTER — Ambulatory Visit (INDEPENDENT_AMBULATORY_CARE_PROVIDER_SITE_OTHER): Payer: Medicare Other | Admitting: Family Medicine

## 2015-08-05 ENCOUNTER — Other Ambulatory Visit: Payer: Self-pay | Admitting: Family Medicine

## 2015-08-05 ENCOUNTER — Encounter: Payer: Self-pay | Admitting: Family Medicine

## 2015-08-05 VITALS — BP 138/76 | HR 68 | Temp 97.5°F | Resp 16 | Ht 67.0 in | Wt 185.0 lb

## 2015-08-05 DIAGNOSIS — Z13820 Encounter for screening for osteoporosis: Secondary | ICD-10-CM

## 2015-08-05 DIAGNOSIS — Z1231 Encounter for screening mammogram for malignant neoplasm of breast: Secondary | ICD-10-CM

## 2015-08-05 DIAGNOSIS — Z0000 Encounter for general adult medical examination without abnormal findings: Secondary | ICD-10-CM | POA: Diagnosis not present

## 2015-08-05 DIAGNOSIS — E7800 Pure hypercholesterolemia, unspecified: Secondary | ICD-10-CM

## 2015-08-05 DIAGNOSIS — I10 Essential (primary) hypertension: Secondary | ICD-10-CM

## 2015-08-05 DIAGNOSIS — Z124 Encounter for screening for malignant neoplasm of cervix: Secondary | ICD-10-CM

## 2015-08-05 DIAGNOSIS — R739 Hyperglycemia, unspecified: Secondary | ICD-10-CM

## 2015-08-05 DIAGNOSIS — Z87891 Personal history of nicotine dependence: Secondary | ICD-10-CM

## 2015-08-05 DIAGNOSIS — E2839 Other primary ovarian failure: Secondary | ICD-10-CM | POA: Diagnosis not present

## 2015-08-05 NOTE — Progress Notes (Signed)
Patient: Angela Winters, Female    DOB: [birth year], 65 y.o.   MRN: [id number] Visit Date: 08/05/2015  Today's Provider: Lelon Huh, MD   Chief Complaint  Patient presents with  . Annual Exam   Subjective:    Annual physical  Angela Winters is a 65 y.o. female. She feels well. She reports not exercising. She reports she is sleeping well.  -----------------------------------------------------------   Pre-Diabetes Mellitus Follow-up:   Lab Results  Component Value Date   HGBA1C 6.1 01/30/2015   HGBA1C 5.9* 08/02/2014   HGBA1C 5.9 03/09/2014   Last seen for Pre-Diabetes 6 months ago.  Management since then includes None. She reports excellent compliance with treatment. She is not having side effects.   ------------------------------------------------------------------------   Hypertension, follow-up:  BP Readings from Last 3 Encounters:  08/05/15 138/76  04/05/15 128/62  01/30/15 112/66    Patient denies chest pain, claudication, dyspnea, irregular heart beat and lower extremity edema.     ------------------------------------------------------------------------    Lipid/Cholesterol, Follow-up:    Last Lipid Panel:    Component Value Date/Time   CHOL 229* 08/02/2014 1008   CHOL 249* 05/03/2014   TRIG 267* 08/02/2014 1008   HDL 60 08/02/2014 1008   HDL 61 05/03/2014   CHOLHDL 3.8 08/02/2014 1008   LDLCALC 116* 08/02/2014 1008   LDLCALC 126 05/03/2014    She reports good compliance with treatment. She is not having side effects.   Wt Readings from Last 3 Encounters:  08/05/15 185 lb (83.915 kg)  04/05/15 183 lb (83.008 kg)  01/30/15 186 lb (84.369 kg)    ------------------------------------------------------------------------    Review of Systems  Constitutional: Negative.   HENT: Positive for congestion, ear pain (Right ear pain since ear infection in March.) and sinus pressure.   Respiratory: Negative.   Cardiovascular:  Negative.   Gastrointestinal: Negative.   Endocrine: Negative.   Genitourinary: Negative.   Musculoskeletal: Negative.   Skin: Negative.   Allergic/Immunologic: Negative.   Neurological: Positive for headaches.  Hematological: Bruises/bleeds easily.  Psychiatric/Behavioral: Negative.     Social History   Social History  . Marital Status: Married    Spouse Name: N/A  . Number of Children: N/A  . Years of Education: N/A   Occupational History  . Not on file.   Social History Main Topics  . Smoking status: Current Every Day Smoker -- 1.00 packs/day for 50 years  . Smokeless tobacco: Not on file  . Alcohol Use: Yes     Comment: Occasional alcohol use  . Drug Use: No  . Sexual Activity: Not on file   Other Topics Concern  . Not on file   Social History Narrative    Past Medical History  Diagnosis Date  . Panic disorder 09/11/2008  . Diverticulosis of colon without hemorrhage 09/11/2008  . Chronic radicular low back pain   . Sciatica of left side   . Hyperglycemia   . History of chicken pox   . Personal history of tobacco use, presenting hazards to health 08/08/2014     Patient Active Problem List   Diagnosis Date Noted  . Personal history of tobacco use, presenting hazards to health 08/08/2014  . Allergic rhinitis 07/25/2014  . Lumbar radiculopathy 07/25/2014  . Hyperglycemia 07/25/2014  . Sciatica of left side 07/25/2014  . H/O total knee replacement 07/08/2014  . Essential (primary) hypertension 07/08/2009  . Diverticulosis 09/11/2008  . Panic disorder 09/11/2008  . Hypercholesterolemia without hypertriglyceridemia 09/11/2008  . Menopausal  symptom 09/11/2008    Past Surgical History  Procedure Laterality Date  . Replacement total knee Left 07/24/2013    Dr. Marry Guan, [facility]    Her family history includes Cancer in her mother.    Current Meds  Medication Sig  . aspirin 81 MG tablet Take 81 mg by mouth daily.   . bisoprolol (ZEBETA) 10 MG tablet TAKE 1  TABLET BY MOUTH DAILY  . cholecalciferol (VITAMIN D) 1000 UNITS tablet VITAMIN D, 1000UNIT (Oral Tablet)  1 Every Day for 0 days  Quantity: 0.00;  Refills: 0   Ordered :09-Jan-2010  Darlin Priestly ;  Started 11-Sep-2008 Active  . citalopram (CELEXA) 40 MG tablet TAKE 1 TABLET (40 MG TOTAL) BY MOUTH DAILY.  Marland Kitchen lisinopril (PRINIVIL,ZESTRIL) 10 MG tablet 1 TABLET, ORAL, DAILY  . MULTIPLE VITAMIN PO 1 tablet daily.   . pravastatin (PRAVACHOL) 80 MG tablet Take 1 tablet (80 mg total) by mouth daily.    Patient Care Team: Birdie Sons, MD as PCP - General (Family Medicine)    Objective:   Vitals: BP 138/76 mmHg  Pulse 68  Temp(Src) 97.5 F (36.4 C) (Oral)  Resp 16  Ht 5\' 7"  (1.702 m)  Wt 185 lb (83.915 kg)  BMI 28.97 kg/m2  Physical Exam  Constitutional: She is oriented to person, place, and time. She appears well-developed and well-nourished.  HENT:  Head: Normocephalic and atraumatic.  Right Ear: External ear normal.  Left Ear: External ear normal.  Nose: Nose normal.  Mouth/Throat: Oropharynx is clear and moist.  Eyes: Conjunctivae and EOM are normal. Pupils are equal, round, and reactive to light.  Neck: Normal range of motion. Neck supple. Carotid bruit is not present.  Cardiovascular: Normal rate, regular rhythm and normal heart sounds.   Pulmonary/Chest: Effort normal and breath sounds normal. Right breast exhibits no inverted nipple, no mass, no nipple discharge, no skin change and no tenderness. Left breast exhibits no inverted nipple, no mass, no nipple discharge, no skin change and no tenderness. Breasts are symmetrical.  Abdominal: Soft. Bowel sounds are normal.  Neurological: She is alert and oriented to person, place, and time.  Skin: Skin is warm and dry.  Psychiatric: She has a normal mood and affect. Her behavior is normal. Judgment and thought content normal.    Activities of Daily Living In your present state of health, do you have any difficulty performing  the following activities: 08/05/2015  Hearing? N  Vision? N  Difficulty concentrating or making decisions? N  Walking or climbing stairs? N  Dressing or bathing? N  Doing errands, shopping? N    Fall Risk Assessment Fall Risk  08/05/2015 01/30/2015  Falls in the past year? No No     Depression Screen PHQ 2/9 Scores 08/05/2015 01/30/2015 01/30/2015  PHQ - 2 Score 0 0 0  PHQ- 9 Score - 0 -    Cognitive Testing - 6-CIT  Correct? Score   What year is it? yes 0 0 or 4  What month is it? yes 0 0 or 3  Memorize:    Pia Mau,  [birth year],  [place],      What time is it? (within 1 hour) yes 0 0 or 3  Count backwards from 20 yes 0 0, 2, or 4  Name the months of the year yes 0 0, 2, or 4  Repeat name & address above yes 0 0, 2, 4, 6, 8, or 10       TOTAL  SCORE  0/28   Interpretation:  Normal  Normal (0-7) Abnormal (8-28)       Assessment & Plan:     Annual Wellness Visit  Reviewed patient's Family Medical History Reviewed and updated list of patient's medical providers Assessment of cognitive impairment was done Assessed patient's functional ability Established a written schedule for health screening [place] Completed and Reviewed  Exercise Activities and Dietary recommendations Goals    None      Immunization History  Administered Date(s) Administered  . Influenza,inj,Quad PF,36+ Mos 01/30/2015  . Pneumococcal Conjugate-13 01/30/2015  . Tdap 09/17/2010    Health Maintenance  Topic Date Due  . ZOSTAVAX  07/25/2009  . MAMMOGRAM  04/16/2011  . DEXA SCAN  07/26/2014  . INFLUENZA VACCINE  08/27/2015  . PNA vac Low Risk Adult (2 of 2 - PPSV23) 01/30/2016  . COLONOSCOPY  08/04/2018  . TETANUS/TDAP  09/16/2020  . Hepatitis C Screening  Completed      Discussed health benefits of physical activity, and encouraged her to engage in regular exercise appropriate for her age and condition.      ------------------------------------------------------------------------------------------------------------ 1. Annual physical exam Generally doing well.   2. Encounter for screening mammogram for breast cancer  - MM Digital Screening  3. Osteoporosis screening  - DG Bone Density  4. Essential (primary) hypertension  - EKG 12-Lead - Renal function panel  5. Hypercholesterolemia without hypertriglyceridemia She is tolerating pravastatin well with no adverse effects.   - Lipid panel - Hepatic function panel  6. Hyperglycemia  - Hemoglobin A1c  7. Estrogen deficiency BMD  8. Cervical cancer screening  - Pap IG and HPV (high risk) DNA detection    Lelon Huh, MD  [facility]

## 2015-08-06 LAB — HEPATIC FUNCTION PANEL
ALT: 18 IU/L (ref 0–32)
AST: 19 IU/L (ref 0–40)
Alkaline Phosphatase: 83 IU/L (ref 39–117)
BILIRUBIN, DIRECT: 0.04 mg/dL (ref 0.00–0.40)
Bilirubin Total: 0.3 mg/dL (ref 0.0–1.2)
Total Protein: 7 g/dL (ref 6.0–8.5)

## 2015-08-06 LAB — LIPID PANEL
CHOLESTEROL TOTAL: 259 mg/dL — AB (ref 100–199)
Chol/HDL Ratio: 4.2 ratio units (ref 0.0–4.4)
HDL: 62 mg/dL (ref >39)
LDL CALC: 140 mg/dL — AB (ref 0–99)
TRIGLYCERIDES: 287 mg/dL — AB (ref 0–149)
VLDL Cholesterol Cal: 57 mg/dL — ABNORMAL HIGH (ref 5–40)

## 2015-08-06 LAB — RENAL FUNCTION PANEL
ALBUMIN: 4.3 g/dL (ref 3.6–4.8)
BUN/Creatinine Ratio: 23 (ref 12–28)
BUN: 17 mg/dL (ref 8–27)
CALCIUM: 9.6 mg/dL (ref 8.7–10.3)
CO2: 24 mmol/L (ref 18–29)
CREATININE: 0.75 mg/dL (ref 0.57–1.00)
Chloride: 97 mmol/L (ref 96–106)
GFR calc Af Amer: 96 mL/min/{1.73_m2} (ref >59)
GFR calc non Af Amer: 83 mL/min/{1.73_m2} (ref >59)
Glucose: 115 mg/dL — ABNORMAL HIGH (ref 65–99)
PHOSPHORUS: 3.9 mg/dL (ref 2.5–4.5)
Potassium: 5.1 mmol/L (ref 3.5–5.2)
SODIUM: 137 mmol/L (ref 134–144)

## 2015-08-06 LAB — HEMOGLOBIN A1C
ESTIMATED AVERAGE GLUCOSE: 123 mg/dL
HEMOGLOBIN A1C: 5.9 % — AB (ref 4.8–5.6)

## 2015-08-07 ENCOUNTER — Telehealth: Payer: Self-pay

## 2015-08-07 LAB — PAP IG AND HPV HIGH-RISK
HPV, high-risk: NEGATIVE
PAP Smear Comment: 0

## 2015-08-07 NOTE — Telephone Encounter (Signed)
-----   Message from Birdie Sons, MD sent at 08/07/2015  8:20 AM EDT ----- Pap test and HPV test are negative. It is probably OK to stop having pap tests at this point.

## 2015-08-07 NOTE — Telephone Encounter (Signed)
[email or web address] sd  

## 2015-08-07 NOTE — Telephone Encounter (Signed)
Pt informed and voiced understanding of results. 

## 2015-08-14 ENCOUNTER — Ambulatory Visit
Admission: RE | Admit: 2015-08-14 | Discharge: 2015-08-14 | Disposition: A | Discharge status: Home / Self Care | Payer: Medicare Other | Source: Ambulatory Visit | Attending: Family Medicine | Admitting: Family Medicine

## 2015-08-14 DIAGNOSIS — Z87891 Personal history of nicotine dependence: Secondary | ICD-10-CM | POA: Diagnosis not present

## 2015-08-19 ENCOUNTER — Telehealth: Payer: Self-pay | Admitting: Family Medicine

## 2015-08-19 NOTE — Telephone Encounter (Signed)
Pt advised.   Thanks,   -Tavarion Babington  

## 2015-08-19 NOTE — Telephone Encounter (Signed)
It was normal. Repeat in 1 year.

## 2015-08-29 ENCOUNTER — Ambulatory Visit
Admission: RE | Admit: 2015-08-29 | Discharge: 2015-08-29 | Disposition: A | Discharge status: Home / Self Care | Payer: Medicare Other | Source: Ambulatory Visit | Attending: Family Medicine | Admitting: Family Medicine

## 2015-08-29 ENCOUNTER — Encounter: Payer: Self-pay | Admitting: Family Medicine

## 2015-08-29 DIAGNOSIS — M8588 Other specified disorders of bone density and structure, other site: Secondary | ICD-10-CM | POA: Insufficient documentation

## 2015-08-29 DIAGNOSIS — M8580 Other specified disorders of bone density and structure, unspecified site: Secondary | ICD-10-CM | POA: Insufficient documentation

## 2015-08-29 DIAGNOSIS — M85851 Other specified disorders of bone density and structure, right thigh: Secondary | ICD-10-CM | POA: Insufficient documentation

## 2015-08-29 DIAGNOSIS — Z13820 Encounter for screening for osteoporosis: Secondary | ICD-10-CM | POA: Diagnosis present

## 2015-08-29 DIAGNOSIS — Z1231 Encounter for screening mammogram for malignant neoplasm of breast: Secondary | ICD-10-CM | POA: Insufficient documentation

## 2015-08-29 DIAGNOSIS — F17200 Nicotine dependence, unspecified, uncomplicated: Secondary | ICD-10-CM | POA: Insufficient documentation

## 2015-08-30 ENCOUNTER — Other Ambulatory Visit: Payer: Self-pay | Admitting: Family Medicine

## 2015-08-30 DIAGNOSIS — N6310 Unspecified lump in the right breast, unspecified quadrant: Secondary | ICD-10-CM

## 2015-09-05 ENCOUNTER — Ambulatory Visit
Admission: RE | Admit: 2015-09-05 | Discharge: 2015-09-05 | Disposition: A | Discharge status: Home / Self Care | Payer: Medicare Other | Source: Ambulatory Visit | Attending: Family Medicine | Admitting: Family Medicine

## 2015-09-05 DIAGNOSIS — N63 Unspecified lump in breast: Secondary | ICD-10-CM | POA: Insufficient documentation

## 2015-09-05 DIAGNOSIS — N6310 Unspecified lump in the right breast, unspecified quadrant: Secondary | ICD-10-CM

## 2016-02-14 ENCOUNTER — Other Ambulatory Visit: Payer: Self-pay | Admitting: Family Medicine

## 2016-04-11 ENCOUNTER — Other Ambulatory Visit: Payer: Self-pay | Admitting: Family Medicine

## 2016-05-20 ENCOUNTER — Other Ambulatory Visit: Payer: Self-pay | Admitting: Family Medicine

## 2016-07-06 ENCOUNTER — Other Ambulatory Visit: Payer: Self-pay | Admitting: Family Medicine

## 2016-07-29 ENCOUNTER — Other Ambulatory Visit: Payer: Self-pay | Admitting: Family Medicine

## 2016-07-31 ENCOUNTER — Other Ambulatory Visit: Payer: Self-pay | Admitting: Family Medicine

## 2016-08-06 ENCOUNTER — Telehealth: Payer: Self-pay | Admitting: *Deleted

## 2016-08-06 NOTE — Telephone Encounter (Signed)
Left message for patient to notify them that it is time to schedule annual low dose lung cancer screening CT scan. Instructed patient to call back to verify information prior to the scan being scheduled.  

## 2016-08-07 ENCOUNTER — Telehealth: Payer: Self-pay | Admitting: *Deleted

## 2016-08-07 DIAGNOSIS — Z87891 Personal history of nicotine dependence: Secondary | ICD-10-CM

## 2016-08-07 NOTE — Telephone Encounter (Signed)
Notified patient that annual lung cancer screening low dose CT scan is due currently or will be in near future. Confirmed that patient is within the age range of 55-77, and asymptomatic, (no signs or symptoms of lung cancer). Patient denies illness that would prevent curative treatment for lung cancer if found. Verified smoking history, (current, 36.25 pack year). The shared decision making visit was done 08/10/14. Patient is agreeable for CT scan being scheduled.

## 2016-08-19 ENCOUNTER — Ambulatory Visit
Admission: RE | Admit: 2016-08-19 | Discharge: 2016-08-19 | Disposition: A | Discharge status: Home / Self Care | Payer: Medicare Other | Source: Ambulatory Visit | Attending: Oncology | Admitting: Oncology

## 2016-08-19 DIAGNOSIS — I700 Atherosclerosis of aorta: Secondary | ICD-10-CM | POA: Insufficient documentation

## 2016-08-19 DIAGNOSIS — J439 Emphysema, unspecified: Secondary | ICD-10-CM | POA: Diagnosis not present

## 2016-08-19 DIAGNOSIS — Z122 Encounter for screening for malignant neoplasm of respiratory organs: Secondary | ICD-10-CM | POA: Insufficient documentation

## 2016-08-19 DIAGNOSIS — Z87891 Personal history of nicotine dependence: Secondary | ICD-10-CM | POA: Diagnosis present

## 2016-08-21 ENCOUNTER — Ambulatory Visit (INDEPENDENT_AMBULATORY_CARE_PROVIDER_SITE_OTHER): Payer: Medicare Other

## 2016-08-21 ENCOUNTER — Encounter: Payer: Self-pay | Admitting: Family Medicine

## 2016-08-21 ENCOUNTER — Ambulatory Visit (INDEPENDENT_AMBULATORY_CARE_PROVIDER_SITE_OTHER): Payer: Medicare Other | Admitting: Family Medicine

## 2016-08-21 VITALS — BP 128/72 | HR 60 | Temp 97.8°F | Ht 67.0 in | Wt 184.8 lb

## 2016-08-21 DIAGNOSIS — J439 Emphysema, unspecified: Secondary | ICD-10-CM | POA: Diagnosis not present

## 2016-08-21 DIAGNOSIS — Z23 Encounter for immunization: Secondary | ICD-10-CM | POA: Diagnosis not present

## 2016-08-21 DIAGNOSIS — Z0000 Encounter for general adult medical examination without abnormal findings: Secondary | ICD-10-CM

## 2016-08-21 DIAGNOSIS — E7800 Pure hypercholesterolemia, unspecified: Secondary | ICD-10-CM | POA: Diagnosis not present

## 2016-08-21 DIAGNOSIS — R739 Hyperglycemia, unspecified: Secondary | ICD-10-CM | POA: Diagnosis not present

## 2016-08-21 DIAGNOSIS — I10 Essential (primary) hypertension: Secondary | ICD-10-CM

## 2016-08-21 DIAGNOSIS — Z6828 Body mass index (BMI) 28.0-28.9, adult: Secondary | ICD-10-CM

## 2016-08-21 DIAGNOSIS — K5730 Diverticulosis of large intestine without perforation or abscess without bleeding: Secondary | ICD-10-CM | POA: Diagnosis not present

## 2016-08-21 DIAGNOSIS — R5383 Other fatigue: Secondary | ICD-10-CM

## 2016-08-21 NOTE — Progress Notes (Signed)
Subjective:   Angela Winters is a 66 y.o. female who presents for Medicare Annual (Subsequent) preventive examination.  Review of Systems:  N/A  Cardiac Risk Factors include: advanced age (>8men, >49 women);smoking/ tobacco exposure;dyslipidemia;hypertension     Objective:     Vitals: BP 128/72 (BP Location: Left Arm)   Pulse 60   Temp 97.8 F (36.6 C) (Oral)   Ht 5\' 7"  (1.702 m)   Wt 184 lb 12.8 oz (83.8 kg)   BMI 28.94 kg/m   Body mass index is 28.94 kg/m.   Tobacco History  Smoking Status  . Current Every Day Smoker  . Packs/day: 1.00  . Years: 50.00  . Types: Cigarettes  Smokeless Tobacco  . Never Used     Ready to quit: No Counseling given: No   Past Medical History:  Diagnosis Date  . Chronic radicular low back pain   . Diverticulosis of colon without hemorrhage 09/11/2008  . History of chicken pox   . Hyperglycemia   . Hyperlipidemia   . Hypertension   . Panic disorder 09/11/2008  . Personal history of tobacco use, presenting hazards to health 08/08/2014  . Sciatica of left side    Past Surgical History:  Procedure Laterality Date  . REPLACEMENT TOTAL KNEE Left 07/24/2013   Dr. Marry Guan, [facility]   Family History  Problem Relation Age of Onset  . Cancer Mother        Colon Cancer   History  Sexual Activity  . Sexual activity: Not on file    Outpatient Encounter Prescriptions as of 08/21/2016  Medication Sig  . aspirin 81 MG tablet Take 81 mg by mouth daily.   . bisoprolol (ZEBETA) 10 MG tablet TAKE 1 TABLET BY MOUTH DAILY  . cholecalciferol (VITAMIN D) 1000 UNITS tablet VITAMIN D, 1000UNIT (Oral Tablet)  1 Every Day for 0 days  Quantity: 0.00;  Refills: 0   Ordered :09-Jan-2010  Darlin Priestly ;  Started 11-Sep-2008 Active  . citalopram (CELEXA) 40 MG tablet TAKE 1 TABLET (40 MG TOTAL) BY MOUTH DAILY.  Marland Kitchen lisinopril (PRINIVIL,ZESTRIL) 10 MG tablet TAKE 1 TABLET BY MOUTH DAILY  . MULTIPLE VITAMIN PO 1 tablet daily.   . pravastatin (PRAVACHOL)  80 MG tablet TAKE 1 TABLET (80 MG TOTAL) BY MOUTH DAILY.  . [DISCONTINUED] pravastatin (PRAVACHOL) 80 MG tablet TAKE 1 TABLET (80 MG TOTAL) BY MOUTH DAILY.   No facility-administered encounter medications on file as of 08/21/2016.     Activities of Daily Living In your present state of health, do you have any difficulty performing the following activities: 08/21/2016  Hearing? N  Vision? N  Difficulty concentrating or making decisions? N  Walking or climbing stairs? N  Dressing or bathing? N  Doing errands, shopping? N  Preparing Food and eating ? N  Using the Toilet? N  In the past six months, have you accidently leaked urine? N  Do you have problems with loss of bowel control? N  Managing your Medications? N  Managing your Finances? N  Housekeeping or managing your Housekeeping? N  Some recent data might be hidden    Patient Care Team: Birdie Sons, MD as PCP - General (Family Medicine) Anell Barr, OD as Consulting Physician (Optometry)    Assessment:     Exercise Activities and Dietary recommendations Current Exercise Habits: The patient does not participate in regular exercise at present, Exercise limited by: None identified  Goals    . Increase water intake  Recommend increasing water intake to 4-6 glasses a day.       Fall Risk Fall Risk  08/21/2016 08/05/2015 01/30/2015  Falls in the past year? No No No   Depression Screen PHQ 2/9 Scores 08/21/2016 08/21/2016 08/05/2015 01/30/2015  PHQ - 2 Score 0 0 0 0  PHQ- 9 Score 2 - - 0     Cognitive Function     6CIT Screen 08/21/2016  What Year? 0 points  What month? 0 points  What time? 0 points  Count back from 20 0 points  Months in reverse 0 points  Repeat phrase 0 points  Total Score 0    Immunization History  Administered Date(s) Administered  . Influenza,inj,Quad PF,36+ Mos 01/30/2015  . Pneumococcal Conjugate-13 01/30/2015  . Pneumococcal Polysaccharide-23 08/21/2016  . Tdap 09/17/2010    Screening Tests Health Maintenance  Topic Date Due  . INFLUENZA VACCINE  08/26/2016  . MAMMOGRAM  08/28/2017  . DEXA SCAN  08/29/2018  . COLONOSCOPY  07/10/2019  . TETANUS/TDAP  09/16/2020  . Hepatitis C Screening  Completed  . PNA vac Low Risk Adult  Completed      Plan:  I have personally reviewed and addressed the Medicare Annual Wellness questionnaire and have noted the following in the patient's chart:  A. Medical and social history B. Use of alcohol, tobacco or illicit drugs  C. Current medications and supplements D. Functional ability and status E.  Nutritional status F.  Physical activity G. Advance directives H. List of other physicians I.  Hospitalizations, surgeries, and ER visits in previous 12 months J.  [place] such as hearing and vision if needed, cognitive and depression L. Referrals and appointments - none  In addition, I have reviewed and discussed with patient certain preventive protocols, quality metrics, and best practice recommendations. A written personalized care plan for preventive services as well as general preventive health recommendations were provided to patient.  See attached scanned questionnaire for additional information.   Signed,  Fabio Neighbors, LPN Nurse Health Advisor   MD Recommendations: None

## 2016-08-21 NOTE — Progress Notes (Signed)
Patient: Angela Winters, Female    DOB: 08/07/1950, 66 y.o.   MRN: [id number] Visit Date: 08/21/2016  Today's Provider: Lelon Huh, MD   No chief complaint on file.  Subjective:   Patient had AWV with McKenzie at 9:30 am today. Presents for annual physcial   Annual physical exam Angela Winters is a 66 y.o. female who presents today for health maintenance and complete physical. She feels well. She reports exercising none. She reports she is sleeping fairly well.  ----------------------------------------------------------------   Hypertension, follow-up:  BP Readings from Last 3 Encounters:  08/21/16 128/72  08/05/15 138/76  04/05/15 128/62    She was last seen for hypertension 1 years ago.  BP at that visit was 138/76. Management since that visit includes; no changes.She reports good compliance with treatment. She is not having side effects. none She is not exercising. She is adherent to low salt diet.   Outside blood pressures are not checking. She is experiencing none.  Patient denies none.   Cardiovascular risk factors include none.  Use of agents associated with hypertension: none.   ----------------------------------------------------------------     Lipid/Cholesterol, Follow-up:   Last seen for this 1 years ago.              Management since that visit includes;cut back on             saturated fats (red meat and dairy) in your diet                  and continue your current cholesterol             medications (pravastatin).  .  Last Lipid Panel:    Component Value Date/Time   CHOL 259 (H) 08/05/2015 1125   TRIG 287 (H) 08/05/2015 1125   HDL 62 08/05/2015 1125   CHOLHDL 4.2 08/05/2015 1125   LDLCALC 140 (H) 08/05/2015 1125    She reports good compliance with treatment. She is not having side effects. none  Wt Readings from Last 3 Encounters:  08/21/16 184 lb 12.8 oz (83.8 kg)  08/19/16 183 lb (83 kg)  08/14/15 185 lb (83.9 kg)     ----------------------------------------------------------------  Hyperglycemia From 08/04/2016-Hemoglobin A1c was checked-5.9. Is on low carbohydrate diet  Review of Systems  Constitutional: Positive for fatigue. Negative for chills, diaphoresis, fever and unexpected weight change.  HENT: Positive for tinnitus.   Eyes: Negative for visual disturbance.  Respiratory: Negative for cough, shortness of breath and wheezing.   Cardiovascular: Negative for chest pain and palpitations.  Gastrointestinal: Negative for abdominal pain, blood in stool, constipation and diarrhea.  Endocrine: Negative for polyuria.  Genitourinary: Negative for frequency.  Musculoskeletal: Negative for arthralgias and myalgias.  Neurological: Negative for dizziness, seizures, numbness and headaches.  Hematological: Bruises/bleeds easily.  Psychiatric/Behavioral: The patient is nervous/anxious.     Social History      She  reports that she has been smoking Cigarettes.  She has a 50.00 pack-year smoking history. She has never used smokeless tobacco. She reports that she drinks alcohol. She reports that she does not use drugs.       Social History   Social History  . Marital status: Married    Spouse name: N/A  . Number of children: N/A  . Years of education: N/A   Social History Main Topics  . Smoking status: Current Every Day Smoker    Packs/day: 1.00    Years: 50.00    Types: Cigarettes  .  Smokeless tobacco: Never Used  . Alcohol use Yes     Comment: Occasionally- whiskey  . Drug use: No  . Sexual activity: Not on file   Other Topics Concern  . Not on file   Social History Narrative  . No narrative on file    Past Medical History:  Diagnosis Date  . Chronic radicular low back pain   . Diverticulosis of colon without hemorrhage 09/11/2008  . History of chicken pox   . Hyperglycemia   . Hyperlipidemia   . Hypertension   . Panic disorder 09/11/2008  . Personal history of tobacco use,  presenting hazards to health 08/08/2014  . Sciatica of left side      Patient Active Problem List   Diagnosis Date Noted  . Osteopenia 08/29/2015  . Personal history of tobacco use, presenting hazards to health 08/08/2014  . Allergic rhinitis 07/25/2014  . Lumbar radiculopathy 07/25/2014  . Hyperglycemia 07/25/2014  . Sciatica of left side 07/25/2014  . H/O total knee replacement 07/08/2014  . Essential (primary) hypertension 07/08/2009  . Diverticulosis 09/11/2008  . Panic disorder 09/11/2008  . Hypercholesterolemia without hypertriglyceridemia 09/11/2008  . Menopausal symptom 09/11/2008    Past Surgical History:  Procedure Laterality Date  . REPLACEMENT TOTAL KNEE Left 07/24/2013   Dr. Marry Guan, [facility]    Family History        Family Status  Relation Status  . Mother Alive  . Father Alive  . Sister Alive  . Brother Alive        Her family history includes Cancer in her mother.     Allergies  Allergen Reactions  . Codeine Nausea Only     Current Outpatient Prescriptions:  .  aspirin 81 MG tablet, Take 81 mg by mouth daily. , Disp: , Rfl:  .  bisoprolol (ZEBETA) 10 MG tablet, TAKE 1 TABLET BY MOUTH DAILY, Disp: 90 tablet, Rfl: 2 .  cholecalciferol (VITAMIN D) 1000 UNITS tablet, VITAMIN D, 1000UNIT (Oral Tablet)  1 Every Day for 0 days  Quantity: 0.00;  Refills: 0   Ordered :09-Jan-2010  Darlin Priestly ;  Started 11-Sep-2008 Active, Disp: , Rfl:  .  citalopram (CELEXA) 40 MG tablet, TAKE 1 TABLET (40 MG TOTAL) BY MOUTH DAILY., Disp: 90 tablet, Rfl: 0 .  lisinopril (PRINIVIL,ZESTRIL) 10 MG tablet, TAKE 1 TABLET BY MOUTH DAILY, Disp: 90 tablet, Rfl: 3 .  MULTIPLE VITAMIN PO, 1 tablet daily. , Disp: , Rfl:  .  pravastatin (PRAVACHOL) 80 MG tablet, TAKE 1 TABLET (80 MG TOTAL) BY MOUTH DAILY., Disp: 90 tablet, Rfl: 3   Patient Care Team: Birdie Sons, MD as PCP - General (Family Medicine) Anell Barr, OD as Consulting Physician (Optometry)      Objective:   Vital  Signs - Last Recorded  Most recent update: 08/21/2016 9:35 AM by Fabio Neighbors, LPN  BP  709/62 (BP Location: Left Arm)     Pulse  60     Temp  97.8 F (36.6 C) (Oral)     Ht  5\' 7"  (1.702 m)     Wt  184 lb 12.8 oz (83.8 kg)      BMI  28.94 kg/m        Physical Exam   General Appearance:    Alert, cooperative, no distress, appears stated age  Head:    Normocephalic, without obvious abnormality, atraumatic  Eyes:    PERRL, conjunctiva/corneas clear, EOM's intact, fundi    benign, both eyes  Ears:  Normal TM's and external ear canals, both ears  Nose:   Nares normal, septum midline, mucosa normal, no drainage    or sinus tenderness  Throat:   Lips, mucosa, and tongue normal; teeth and gums normal  Neck:   Supple, symmetrical, trachea midline, no adenopathy;    thyroid:  no enlargement/tenderness/nodules; no carotid   bruit or JVD  Back:     Symmetric, no curvature, ROM normal, no CVA tenderness  Lungs:     Clear to auscultation bilaterally, respirations unlabored  Chest Wall:    No tenderness or deformity   Heart:    Regular rate and rhythm, S1 and S2 normal, no murmur, rub   or gallop  Breast Exam:    normal appearance, no masses or tenderness  Abdomen:     Soft, non-tender, bowel sounds active all four quadrants,    no masses, no organomegaly  Pelvic:    no longer indicated  Extremities:   Extremities normal, atraumatic, no cyanosis or edema  Pulses:   2+ and symmetric all extremities  Skin:   Skin color, texture, turgor normal, no rashes or lesions  Lymph nodes:   Cervical, supraclavicular, and axillary nodes normal  Neurologic:   CNII-XII intact, normal strength, sensation and reflexes    throughout    Depression Screen PHQ 2/9 Scores 08/21/2016 08/21/2016 08/05/2015 01/30/2015  PHQ - 2 Score 0 0 0 0  PHQ- 9 Score 2 - - 0        Assessment & Plan:     Routine Health Maintenance and Physical Exam  Exercise Activities and Dietary  recommendations Goals    . Increase water intake          Recommend increasing water intake to 4-6 glasses a day.        Immunization History  Administered Date(s) Administered  . Influenza,inj,Quad PF,36+ Mos 01/30/2015  . Pneumococcal Conjugate-13 01/30/2015  . Tdap 09/17/2010    Health Maintenance  Topic Date Due  . INFLUENZA VACCINE  08/26/2016  . MAMMOGRAM  08/28/2017  . DEXA SCAN  08/29/2018  . COLONOSCOPY  07/10/2019  . TETANUS/TDAP  09/16/2020  . Hepatitis C Screening  Completed  . PNA vac Low Risk Adult  Completed     Discussed health benefits of physical activity, and encouraged her to engage in regular exercise appropriate for her age and condition.    --------------------------------------------------------------------  1. Annual physical exam Generally doing well.   2. Essential (primary) hypertension Well controlled.  Continue current medications.   - EKG 12-Lead  3. Hyperglycemia  - Hemoglobin A1c  4. Hypercholesterolemia without hypertriglyceridemia She is tolerating pravastatin well with no adverse effects.   - Comprehensive metabolic panel - CBC - Lipid panel  5. BMI 28.0-28.9,adult Had recently been on reduced carbohydrate diet and reports 10 pound weight loss.   6. Diverticulosis of colon without hemorrhage She reports having had colonoscopy by Dr. Tiffany Kocher in 2016. Will send for records.   7. Pulmonary emphysema, unspecified emphysema type ([place]) Counseled on importance of smoking cessation to slow progression of lung disease. Offered Zyban prescription which she declined, but will consider trying if unable to quit on her own.   8. Other fatigue  - TSH    Lelon Huh, MD  [facility]

## 2016-08-21 NOTE — Patient Instructions (Addendum)
Bupropion sustained-release tablets (Zyban) for smoking cessation  What is this medicine? BUPROPION (byoo PROE pee on) is used to help people quit smoking. This medicine may be used for other purposes; ask your health care provider or pharmacist if you have questions. COMMON BRAND NAME(S): Buproban, Zyban What should I tell my health care provider before I take this medicine? They need to know if you have any of these conditions: -an eating disorder, such as anorexia or bulimia -bipolar disorder or psychosis -diabetes or high blood sugar, treated with medication -glaucoma -head injury or brain tumor -heart disease, previous heart attack, or irregular heart beat -high blood pressure -kidney or liver disease -seizures -suicidal thoughts or a previous suicide attempt -Tourette's syndrome -weight loss -an unusual or allergic reaction to bupropion, other medicines, foods, dyes, or preservatives -breast-feeding -pregnant or trying to become pregnant How should I use this medicine? Take this medicine by mouth with a glass of water. Follow the directions on the prescription label. You can take it with or without food. If it upsets your stomach, take it with food. Do not cut, crush or chew this medicine. Take your medicine at regular intervals. If you take this medicine more than once a day, take your second dose at least 8 hours after you take your first dose. To limit difficulty in sleeping, avoid taking this medicine at bedtime. Do not take your medicine more often than directed. Do not stop taking this medicine suddenly except upon the advice of your doctor. Stopping this medicine too quickly may cause serious side effects. A special MedGuide will be given to you by the pharmacist with each prescription and refill. Be sure to read this information carefully each time. Talk to your pediatrician regarding the use of this medicine in children. Special care may be needed. Overdosage: If you think  you have taken too much of this medicine contact a poison control center or emergency room at once. NOTE: This medicine is only for you. Do not share this medicine with others. What if I miss a dose? If you miss a dose, skip the missed dose and take your next tablet at the regular time. There should be at least 8 hours between doses. Do not take double or extra doses. What may interact with this medicine? Do not take this medicine with any of the following medications: -linezolid -MAOIs like Azilect, Carbex, Eldepryl, Marplan, Nardil, and Parnate -methylene blue (injected into a vein) -other medicines that contain bupropion like Wellbutrin This medicine may also interact with the following medications: -alcohol -certain medicines for anxiety or sleep -certain medicines for blood pressure like metoprolol, propranolol -certain medicines for depression or psychotic disturbances -certain medicines for HIV or AIDS like efavirenz, lopinavir, nelfinavir, ritonavir -certain medicines for irregular heart beat like propafenone, flecainide -certain medicines for Parkinson's disease like amantadine, levodopa -certain medicines for seizures like carbamazepine, phenytoin, phenobarbital -cimetidine -clopidogrel -cyclophosphamide -digoxin -furazolidone -isoniazid -nicotine -orphenadrine -procarbazine -steroid medicines like prednisone or cortisone -stimulant medicines for attention disorders, weight loss, or to stay awake -tamoxifen -theophylline -thiotepa -ticlopidine -tramadol -warfarin This list may not describe all possible interactions. Give your health care provider a list of all the medicines, herbs, non-prescription drugs, or dietary supplements you use. Also tell them if you smoke, drink alcohol, or use illegal drugs. Some items may interact with your medicine. What should I watch for while using this medicine? Visit your doctor or health care professional for regular checks on your  progress. This medicine should be used  together with a patient support program. It is important to participate in a behavioral program, counseling, or other support program that is recommended by your health care professional. Patients and their families should watch out for new or worsening thoughts of suicide or depression. Also watch out for sudden changes in feelings such as feeling anxious, agitated, panicky, irritable, hostile, aggressive, impulsive, severely restless, overly excited and hyperactive, or not being able to sleep. If this happens, especially at the beginning of treatment or after a change in dose, call your health care professional. Avoid alcoholic drinks while taking this medicine. Drinking excessive alcoholic beverages, using sleeping or anxiety medicines, or quickly stopping the use of these agents while taking this medicine may increase your risk for a seizure. Do not drive or use heavy machinery until you know how this medicine affects you. This medicine can impair your ability to perform these tasks. Do not take this medicine close to bedtime. It may prevent you from sleeping. Your mouth may get dry. Chewing sugarless gum or sucking hard candy, and drinking plenty of water may help. Contact your doctor if the problem does not go away or is severe. Do not use nicotine patches or chewing gum without the advice of your doctor or health care professional while taking this medicine. You may need to have your blood pressure taken regularly if your doctor recommends that you use both nicotine and this medicine together. What side effects may I notice from receiving this medicine? Side effects that you should report to your doctor or health care professional as soon as possible: -allergic reactions like skin rash, itching or hives, swelling of the face, lips, or tongue -breathing problems -changes in vision -confusion -elevated mood, decreased need for sleep, racing thoughts, impulsive  behavior -fast or irregular heartbeat -hallucinations, loss of contact with reality -increased blood pressure -redness, blistering, peeling or loosening of the skin, including inside the mouth -seizures -suicidal thoughts or other mood changes -unusually weak or tired -vomiting Side effects that usually do not require medical attention (report to your doctor or health care professional if they continue or are bothersome): -constipation -headache -loss of appetite -nausea -tremors -weight loss This list may not describe all possible side effects. Call your doctor for medical advice about side effects. You may report side effects to FDA at 1-800-FDA-1088. Where should I keep my medicine? Keep out of the reach of children. Store at room temperature between 20 and 25 degrees C (68 and 77 degrees F). Protect from light. Keep container tightly closed. Throw away any unused medicine after the expiration date. NOTE: This sheet is a summary. It may not cover all possible information. If you have questions about this medicine, talk to your doctor, pharmacist, or health care provider.  2018 Elsevier/Gold Standard (2015-07-05 13:49:28)    Health Risks of Smoking Smoking cigarettes is very bad for your health. Tobacco smoke has over 200 known poisons in it. It contains the poisonous gases nitrogen oxide and carbon monoxide. There are over 60 chemicals in tobacco smoke that cause cancer. Smoking is difficult to quit because a chemical in tobacco, called nicotine, causes addiction or dependence. When you smoke and inhale, nicotine is absorbed rapidly into the bloodstream through your lungs. Both inhaled and non-inhaled nicotine may be addictive. What are the risks of cigarette smoke? Cigarette smokers have an increased risk of many serious medical problems, including:  Lung cancer.  Lung disease, such as pneumonia, bronchitis, and emphysema.  Chest pain (angina) and heart  attack because the  heart is not getting enough oxygen.  Heart disease and peripheral blood vessel disease.  High blood pressure (hypertension).  Stroke.  Oral cancer, including cancer of the lip, mouth, or voice box.  Bladder cancer.  Pancreatic cancer.  Cervical cancer.  Pregnancy complications, including premature birth.  Stillbirths and smaller newborn babies, birth defects, and genetic damage to sperm.  Early menopause.  Lower estrogen level for women.  Infertility.  Facial wrinkles.  Blindness.  Increased risk of broken bones (fractures).  Senile dementia.  Stomach ulcers and internal bleeding.  Delayed wound healing and increased risk of complications during surgery.  Even smoking lightly shortens your life expectancy by several years.  Because of secondhand smoke exposure, children of smokers have an increased risk of the following:  Sudden infant death syndrome (SIDS).  Respiratory infections.  Lung cancer.  Heart disease.  Ear infections.  What are the benefits of quitting? There are many health benefits of quitting smoking. Here are some of them:  Within days of quitting smoking, your risk of having a heart attack decreases, your blood flow improves, and your lung capacity improves. Blood pressure, pulse rate, and breathing patterns start returning to normal soon after quitting.  Within months, your lungs may clear up completely.  Quitting for 10 years reduces your risk of developing lung cancer and heart disease to almost that of a nonsmoker.  People who quit may see an improvement in their overall quality of life.  How do I quit smoking? Smoking is an addiction with both physical and psychological effects, and longtime habits can be hard to change. Your health care provider can recommend:  Programs and community resources, which may include group support, education, or talk therapy.  Prescription medicines to help reduce cravings.  Nicotine replacement  products, such as patches, gum, and nasal sprays. Use these products only as directed. Do not replace cigarette smoking with electronic cigarettes, which are commonly called e-cigarettes. The safety of e-cigarettes is not known, and some may contain harmful chemicals.  A combination of two or more of these methods.  Where to find more information:  American Lung Association: www.lung.org  American Cancer Society: www.cancer.org Summary  Smoking cigarettes is very bad for your health. Cigarette smokers have an increased risk of many serious medical problems, including several cancers, heart disease, and stroke.  Smoking is an addiction with both physical and psychological effects, and longtime habits can be hard to change.  By stopping right away, you can greatly reduce the risk of medical problems for you and your family.  To help you quit smoking, your health care provider can recommend programs, community resources, prescription medicines, and nicotine replacement products such as patches, gum, and nasal sprays. This information is not intended to replace advice given to you by your health care provider. Make sure you discuss any questions you have with your health care provider. Document Released: 02/20/2004 Document Revised: 01/17/2016 Document Reviewed: 01/17/2016 Elsevier Interactive Patient Education  2017 [employer].

## 2016-08-21 NOTE — Patient Instructions (Signed)
Angela Winters , Thank you for taking time to come for your Medicare Wellness Visit. I appreciate your ongoing commitment to your health goals. Please review the following plan we discussed and let me know if I can assist you in the future.   Screening recommendations/referrals: Colonoscopy: completed 08/03/08, due 07/2018 Mammogram: completed 09/05/15, due 08/2016 Bone Density: completed 08/29/15 Recommended yearly ophthalmology/optometry visit for glaucoma screening and checkup Recommended yearly dental visit for hygiene and checkup  Vaccinations: Influenza vaccine: due 09/2016 Pneumococcal vaccine: Prevnar 13 given 01/30/15, Pneumovax 23 given today Tdap vaccine: completed 09/17/10, due 08/2020 Shingles vaccine: declined    Advanced directives: Advance directive discussed with you today. Even though you declined this today please call our office should you change your mind and we can give you the proper paperwork for you to fill out.  Conditions/risks identified: Smoking cessation; Recommend increasing water intake to 4-6 glasses a day.   Next appointment: None, need to schedule 1 year AWV   Preventive Care 23 Years and Older, Female Preventive care refers to lifestyle choices and visits with your health care provider that can promote health and wellness. What does preventive care include?  A yearly physical exam. This is also called an annual well check.  Dental exams once or twice a year.  Routine eye exams. Ask your health care provider how often you should have your eyes checked.  Personal lifestyle choices, including:  Daily care of your teeth and gums.  Regular physical activity.  Eating a healthy diet.  Avoiding tobacco and drug use.  Limiting alcohol use.  Practicing safe sex.  Taking low-dose aspirin every day.  Taking vitamin and mineral supplements as recommended by your health care provider. What happens during an annual well check? The services and  screenings done by your health care provider during your annual well check will depend on your age, overall health, lifestyle risk factors, and family history of disease. Counseling  Your health care provider may ask you questions about your:  Alcohol use.  Tobacco use.  Drug use.  Emotional well-being.  Home and relationship well-being.  Sexual activity.  Eating habits.  History of falls.  Memory and ability to understand (cognition).  Work and work Statistician.  Reproductive health. Screening  You may have the following tests or measurements:  Height, weight, and BMI.  Blood pressure.  Lipid and cholesterol levels. These may be checked every 5 years, or more frequently if you are over 42 years old.  Skin check.  Lung cancer screening. You may have this screening every year starting at age 64 if you have a 30-pack-year history of smoking and currently smoke or have quit within the past 15 years.  Fecal occult blood test (FOBT) of the stool. You may have this test every year starting at age 42.  Flexible sigmoidoscopy or colonoscopy. You may have a sigmoidoscopy every 5 years or a colonoscopy every 10 years starting at age 93.  Hepatitis C blood test.  Hepatitis B blood test.  Sexually transmitted disease (STD) testing.  Diabetes screening. This is done by checking your blood sugar (glucose) after you have not eaten for a while (fasting). You may have this done every 1-3 years.  Bone density scan. This is done to screen for osteoporosis. You may have this done starting at age 44.  Mammogram. This may be done every 1-2 years. Talk to your health care provider about how often you should have regular mammograms. Talk with your health care provider about  your test results, treatment options, and if necessary, the need for more tests. Vaccines  Your health care provider may recommend certain vaccines, such as:  Influenza vaccine. This is recommended every  year.  Tetanus, diphtheria, and acellular pertussis (Tdap, Td) vaccine. You may need a Td booster every 10 years.  Zoster vaccine. You may need this after age 49.  Pneumococcal 13-valent conjugate (PCV13) vaccine. One dose is recommended after age 76.  Pneumococcal polysaccharide (PPSV23) vaccine. One dose is recommended after age 23. Talk to your health care provider about which screenings and vaccines you need and how often you need them. This information is not intended to replace advice given to you by your health care provider. Make sure you discuss any questions you have with your health care provider. Document Released: 02/08/2015 Document Revised: 10/02/2015 Document Reviewed: 11/13/2014 Elsevier Interactive Patient Education  2017 [place] Prevention in the Home Falls can cause injuries. They can happen to people of all ages. There are many things you can do to make your home safe and to help prevent falls. What can I do on the outside of my home?  Regularly fix the edges of walkways and driveways and fix any cracks.  Remove anything that might make you trip as you walk through a door, such as a raised step or threshold.  Trim any bushes or trees on the path to your home.  Use bright outdoor lighting.  Clear any walking paths of anything that might make someone trip, such as rocks or tools.  Regularly check to see if handrails are loose or broken. Make sure that both sides of any steps have handrails.  Any raised decks and porches should have guardrails on the edges.  Have any leaves, snow, or ice cleared regularly.  Use sand or salt on walking paths during winter.  Clean up any spills in your garage right away. This includes oil or grease spills. What can I do in the bathroom?  Use night lights.  Install grab bars by the toilet and in the tub and shower. Do not use towel bars as grab bars.  Use non-skid mats or decals in the tub or shower.  If you  need to sit down in the shower, use a plastic, non-slip stool.  Keep the floor dry. Clean up any water that spills on the floor as soon as it happens.  Remove soap buildup in the tub or shower regularly.  Attach bath mats securely with double-sided non-slip rug tape.  Do not have throw rugs and other things on the floor that can make you trip. What can I do in the bedroom?  Use night lights.  Make sure that you have a light by your bed that is easy to reach.  Do not use any sheets or blankets that are too big for your bed. They should not hang down onto the floor.  Have a firm chair that has side arms. You can use this for support while you get dressed.  Do not have throw rugs and other things on the floor that can make you trip. What can I do in the kitchen?  Clean up any spills right away.  Avoid walking on wet floors.  Keep items that you use a lot in easy-to-reach places.  If you need to reach something above you, use a strong step stool that has a grab bar.  Keep electrical cords out of the way.  Do not use floor polish or wax  that makes floors slippery. If you must use wax, use non-skid floor wax.  Do not have throw rugs and other things on the floor that can make you trip. What can I do with my stairs?  Do not leave any items on the stairs.  Make sure that there are handrails on both sides of the stairs and use them. Fix handrails that are broken or loose. Make sure that handrails are as long as the stairways.  Check any carpeting to make sure that it is firmly attached to the stairs. Fix any carpet that is loose or worn.  Avoid having throw rugs at the top or bottom of the stairs. If you do have throw rugs, attach them to the floor with carpet tape.  Make sure that you have a light switch at the top of the stairs and the bottom of the stairs. If you do not have them, ask someone to add them for you. What else can I do to help prevent falls?  Wear shoes  that:  Do not have high heels.  Have rubber bottoms.  Are comfortable and fit you well.  Are closed at the toe. Do not wear sandals.  If you use a stepladder:  Make sure that it is fully opened. Do not climb a closed stepladder.  Make sure that both sides of the stepladder are locked into place.  Ask someone to hold it for you, if possible.  Clearly mark and make sure that you can see:  Any grab bars or handrails.  First and last steps.  Where the edge of each step is.  Use tools that help you move around (mobility aids) if they are needed. These include:  Canes.  Walkers.  Scooters.  Crutches.  Turn on the lights when you go into a dark area. Replace any light bulbs as soon as they burn out.  Set up your furniture so you have a clear path. Avoid moving your furniture around.  If any of your floors are uneven, fix them.  If there are any pets around you, be aware of where they are.  Review your medicines with your doctor. Some medicines can make you feel dizzy. This can increase your chance of falling. Ask your doctor what other things that you can do to help prevent falls. This information is not intended to replace advice given to you by your health care provider. Make sure you discuss any questions you have with your health care provider. Document Released: 11/08/2008 Document Revised: 06/20/2015 Document Reviewed: 02/16/2014 Elsevier Interactive Patient Education  2017 [employer].

## 2016-08-22 LAB — CBC
Hematocrit: 45.3 % (ref 34.0–46.6)
Hemoglobin: 15.1 g/dL (ref 11.1–15.9)
MCH: 31.7 pg (ref 26.6–33.0)
MCHC: 33.3 g/dL (ref 31.5–35.7)
MCV: 95 fL (ref 79–97)
PLATELETS: 267 10*3/uL (ref 150–379)
RBC: 4.76 x10E6/uL (ref 3.77–5.28)
RDW: 13.7 % (ref 12.3–15.4)
WBC: 11 10*3/uL — AB (ref 3.4–10.8)

## 2016-08-22 LAB — COMPREHENSIVE METABOLIC PANEL
A/G RATIO: 1.7 (ref 1.2–2.2)
ALBUMIN: 4.5 g/dL (ref 3.6–4.8)
ALK PHOS: 75 IU/L (ref 39–117)
ALT: 13 IU/L (ref 0–32)
AST: 18 IU/L (ref 0–40)
BILIRUBIN TOTAL: 0.2 mg/dL (ref 0.0–1.2)
BUN / CREAT RATIO: 18 (ref 12–28)
BUN: 15 mg/dL (ref 8–27)
CHLORIDE: 101 mmol/L (ref 96–106)
CO2: 22 mmol/L (ref 20–29)
Calcium: 9.7 mg/dL (ref 8.7–10.3)
Creatinine, Ser: 0.84 mg/dL (ref 0.57–1.00)
GFR calc Af Amer: 83 mL/min/{1.73_m2} (ref >59)
GFR calc non Af Amer: 72 mL/min/{1.73_m2} (ref >59)
GLOBULIN, TOTAL: 2.7 g/dL (ref 1.5–4.5)
GLUCOSE: 124 mg/dL — AB (ref 65–99)
Potassium: 5.1 mmol/L (ref 3.5–5.2)
SODIUM: 141 mmol/L (ref 134–144)
Total Protein: 7.2 g/dL (ref 6.0–8.5)

## 2016-08-22 LAB — LIPID PANEL
CHOLESTEROL TOTAL: 203 mg/dL — AB (ref 100–199)
Chol/HDL Ratio: 3.5 ratio (ref 0.0–4.4)
HDL: 58 mg/dL (ref >39)
LDL CALC: 98 mg/dL (ref 0–99)
Triglycerides: 234 mg/dL — ABNORMAL HIGH (ref 0–149)
VLDL Cholesterol Cal: 47 mg/dL — ABNORMAL HIGH (ref 5–40)

## 2016-08-22 LAB — HEMOGLOBIN A1C
ESTIMATED AVERAGE GLUCOSE: 128 mg/dL
Hgb A1c MFr Bld: 6.1 % — ABNORMAL HIGH (ref 4.8–5.6)

## 2016-08-22 LAB — TSH: TSH: 0.718 u[IU]/mL (ref 0.450–4.500)

## 2016-08-25 ENCOUNTER — Encounter: Payer: Self-pay | Admitting: *Deleted

## 2016-09-04 ENCOUNTER — Encounter: Payer: Self-pay | Admitting: Family Medicine

## 2016-09-08 ENCOUNTER — Encounter: Payer: Self-pay | Admitting: Family Medicine

## 2016-10-05 ENCOUNTER — Other Ambulatory Visit: Payer: Self-pay | Admitting: Family Medicine

## 2016-10-05 DIAGNOSIS — Z1231 Encounter for screening mammogram for malignant neoplasm of breast: Secondary | ICD-10-CM

## 2016-10-22 ENCOUNTER — Ambulatory Visit
Admission: RE | Admit: 2016-10-22 | Discharge: 2016-10-22 | Disposition: A | Discharge status: Home / Self Care | Payer: Medicare Other | Source: Ambulatory Visit | Attending: Family Medicine | Admitting: Family Medicine

## 2016-10-22 DIAGNOSIS — Z1231 Encounter for screening mammogram for malignant neoplasm of breast: Secondary | ICD-10-CM

## 2016-10-26 ENCOUNTER — Other Ambulatory Visit: Payer: Self-pay | Admitting: Family Medicine

## 2017-02-15 ENCOUNTER — Other Ambulatory Visit: Payer: Self-pay | Admitting: Family Medicine

## 2017-02-15 MED ORDER — BISOPROLOL FUMARATE 10 MG PO TABS: 10.0000 mg | ORAL_TABLET | Freq: Every day | ORAL | 2 refills | Status: DC

## 2017-02-15 NOTE — Telephone Encounter (Signed)
[facility] faxed a refill request for a 90-days supply for the following medication. Thanks  CC  bisoprolol (ZEBETA) 10 MG tablet

## 2017-02-23 ENCOUNTER — Telehealth: Payer: Self-pay

## 2017-02-23 MED ORDER — METOPROLOL SUCCINATE ER 50 MG PO TB24: 50.0000 mg | ORAL_TABLET | Freq: Every day | ORAL | 3 refills | Status: DC

## 2017-02-23 NOTE — Telephone Encounter (Signed)
Please advised changed bisoprolol  to metoprolol ER once a day, have sent prescription to [facility].

## 2017-02-23 NOTE — Telephone Encounter (Signed)
[facility]) sent a fax requesting an alternative medication to Bisoprolol Furmate 10mg . This medication is on back order and the pharmacy is unable to get it. Pharmacy wants to know if you would be able to send in an alternative?

## 2017-02-24 NOTE — Telephone Encounter (Signed)
Patient was advised.  

## 2017-02-24 NOTE — Telephone Encounter (Signed)
LMOVM for pt to return call 

## 2017-03-12 ENCOUNTER — Other Ambulatory Visit: Payer: Self-pay | Admitting: Orthopedic Surgery

## 2017-03-12 DIAGNOSIS — M5431 Sciatica, right side: Secondary | ICD-10-CM

## 2017-03-12 DIAGNOSIS — M5417 Radiculopathy, lumbosacral region: Secondary | ICD-10-CM

## 2017-03-24 ENCOUNTER — Ambulatory Visit
Admission: RE | Admit: 2017-03-24 | Discharge: 2017-03-24 | Disposition: A | Discharge status: Home / Self Care | Payer: Medicare Other | Source: Ambulatory Visit | Attending: Orthopedic Surgery | Admitting: Orthopedic Surgery

## 2017-03-24 DIAGNOSIS — M5136 Other intervertebral disc degeneration, lumbar region: Secondary | ICD-10-CM | POA: Insufficient documentation

## 2017-03-24 DIAGNOSIS — M48061 Spinal stenosis, lumbar region without neurogenic claudication: Secondary | ICD-10-CM | POA: Insufficient documentation

## 2017-03-24 DIAGNOSIS — M5417 Radiculopathy, lumbosacral region: Secondary | ICD-10-CM | POA: Insufficient documentation

## 2017-03-24 DIAGNOSIS — M5431 Sciatica, right side: Secondary | ICD-10-CM | POA: Insufficient documentation

## 2017-04-03 ENCOUNTER — Other Ambulatory Visit: Payer: Self-pay | Admitting: Family Medicine

## 2017-08-10 ENCOUNTER — Telehealth: Payer: Self-pay | Admitting: *Deleted

## 2017-08-10 ENCOUNTER — Other Ambulatory Visit: Payer: Self-pay | Admitting: Family Medicine

## 2017-08-10 DIAGNOSIS — Z87891 Personal history of nicotine dependence: Secondary | ICD-10-CM

## 2017-08-10 DIAGNOSIS — Z122 Encounter for screening for malignant neoplasm of respiratory organs: Secondary | ICD-10-CM

## 2017-08-10 NOTE — Telephone Encounter (Signed)
Notified patient that annual lung cancer screening low dose CT scan is due currently or will be in near future. Confirmed that patient is within the age range of 55-77, and asymptomatic, (no signs or symptoms of lung cancer). Patient denies illness that would prevent curative treatment for lung cancer if found. Verified smoking history, (current, 37.25 pack year). The shared decision making visit was done 08/10/14. Patient is agreeable for CT scan being scheduled.

## 2017-08-23 ENCOUNTER — Ambulatory Visit
Admission: RE | Admit: 2017-08-23 | Discharge: 2017-08-23 | Disposition: A | Discharge status: Home / Self Care | Payer: Medicare Other | Source: Ambulatory Visit | Attending: Oncology | Admitting: Oncology

## 2017-08-23 DIAGNOSIS — J432 Centrilobular emphysema: Secondary | ICD-10-CM | POA: Insufficient documentation

## 2017-08-23 DIAGNOSIS — I700 Atherosclerosis of aorta: Secondary | ICD-10-CM | POA: Insufficient documentation

## 2017-08-23 DIAGNOSIS — J438 Other emphysema: Secondary | ICD-10-CM | POA: Insufficient documentation

## 2017-08-23 DIAGNOSIS — D3502 Benign neoplasm of left adrenal gland: Secondary | ICD-10-CM | POA: Diagnosis not present

## 2017-08-23 DIAGNOSIS — F17210 Nicotine dependence, cigarettes, uncomplicated: Secondary | ICD-10-CM | POA: Diagnosis not present

## 2017-08-23 DIAGNOSIS — R918 Other nonspecific abnormal finding of lung field: Secondary | ICD-10-CM | POA: Diagnosis not present

## 2017-08-23 DIAGNOSIS — I2510 Atherosclerotic heart disease of native coronary artery without angina pectoris: Secondary | ICD-10-CM | POA: Diagnosis not present

## 2017-08-23 DIAGNOSIS — Z122 Encounter for screening for malignant neoplasm of respiratory organs: Secondary | ICD-10-CM | POA: Insufficient documentation

## 2017-08-23 DIAGNOSIS — Z87891 Personal history of nicotine dependence: Secondary | ICD-10-CM

## 2017-08-24 NOTE — Progress Notes (Signed)
Patient: Angela Winters, Female    DOB: 04-18-1950, 67 y.o.   MRN: [id number] Visit Date: 08/25/2017  Today's Provider: Lelon Huh, MD   Chief Complaint  Patient presents with  . Annual Exam  . Hypertension  . Hyperlipidemia  . Hyperglycemia   Subjective:   Patient saw McKenzie for AWV today at 2:20 pm   Complete Physical Angela Winters is a 67 y.o. female. She feels well. She reports exercising none. She reports she is sleeping fair.  -------------    Wt Readings from Last 5 Encounters:  08/25/17 167 lb 6.4 oz (75.9 kg)  08/25/17 167 lb 6.4 oz (75.9 kg)  08/23/17 170 lb (77.1 kg)  08/21/16 184 lb 12.8 oz (83.8 kg)  08/19/16 183 lb (83 kg)     ----------------------------------------------   Hypertension, follow-up:  BP Readings from Last 3 Encounters:  08/25/17 126/62  08/25/17 126/62  08/21/16 128/72    She was last seen for hypertension 1 years ago.  BP at that visit was 128/72. Management since that visit includes; no changes.She reports good compliance with treatment. She is not having side effects. none She is not exercising. She is adherent to low salt diet.   Outside blood pressures are not be checking. She is experiencing none.  Patient denies chest pain, chest pressure/discomfort, claudication, dyspnea, exertional chest pressure/discomfort, fatigue, irregular heart beat, lower extremity edema, near-syncope, orthopnea, palpitations, paroxysmal nocturnal dyspnea, syncope and tachypnea.   Cardiovascular risk factors include advanced age (older than 22 for men, 53 for women).  Use of agents associated with hypertension: none.   ------------------------------------------------------------------------   Lipid/Cholesterol, Follow-up:   Last seen for this 1 years ago.  Management since that visit includes; labs checked, no changes.  Last Lipid Panel:    Component Value Date/Time   CHOL 203 (H) 08/21/2016 1119   TRIG 234 (H) 08/21/2016 1119   HDL  58 08/21/2016 1119   CHOLHDL 3.5 08/21/2016 1119   LDLCALC 98 08/21/2016 1119    She reports good compliance with treatment. She is not having side effects.   Wt Readings from Last 3 Encounters:  08/25/17 167 lb 6.4 oz (75.9 kg)  08/25/17 167 lb 6.4 oz (75.9 kg)  08/23/17 170 lb (77.1 kg)    ------------------------------------------------------------------------  Hyperglycemia From 08/21/2016-labs checked. Advised to continue to avoid  sweets and starchy foods, be sure to have your sugar checked once a year. Hemoglobin A1c 6.1%   Review of Systems  Constitutional: Negative.   HENT: Negative.   Eyes: Negative.   Respiratory: Negative.   Cardiovascular: Negative.   Gastrointestinal: Negative.   Endocrine: Negative.   Genitourinary: Negative.   Musculoskeletal: Positive for back pain.  Skin: Negative.   Allergic/Immunologic: Negative.   Neurological: Negative.   Hematological: Bruises/bleeds easily.  Psychiatric/Behavioral: Negative.     Social History   Socioeconomic History  . Marital status: Married    Spouse name: Not on file  . Number of children: 1  . Years of education: Not on file  . Highest education level: 12th grade  Occupational History  . Occupation: retired  Scientific laboratory technician  . Financial resource strain: Not hard at all  . Food insecurity:    Worry: Never true    Inability: Never true  . Transportation needs:    Medical: No    Non-medical: No  Tobacco Use  . Smoking status: Current Every Day Smoker    Packs/day: 0.75    Years: 50.00    Pack years: 37.50  Types: Cigarettes  . Smokeless tobacco: Never Used  Substance and Sexual Activity  . Alcohol use: Yes    Alcohol/week: 1.2 - 1.8 oz    Types: 2 - 3 Shots of liquor per week  . Drug use: No  . Sexual activity: Not on file  Lifestyle  . Physical activity:    Days per week: Not on file    Minutes per session: Not on file  . Stress: Not at all  Relationships  . Social connections:     Talks on phone: Not on file    Gets together: Not on file    Attends religious service: Not on file    Active member of club or organization: Not on file    Attends meetings of clubs or organizations: Not on file    Relationship status: Not on file  . Intimate partner violence:    Fear of current or ex partner: Not on file    Emotionally abused: Not on file    Physically abused: Not on file    Forced sexual activity: Not on file  Other Topics Concern  . Not on file  Social History Narrative  . Not on file    Past Medical History:  Diagnosis Date  . Chronic radicular low back pain   . Hypertension   . Sciatica of left side   . Spinal stenosis at L4-L5 level      Patient Active Problem List   Diagnosis Date Noted  . Pulmonary emphysema ([place]) 08/21/2016  . Osteopenia 08/29/2015  . Smoking greater than 30 pack years 08/08/2014  . Allergic rhinitis 07/25/2014  . Lumbar radiculopathy 07/25/2014  . Hyperglycemia 07/25/2014  . Sciatica of left side 07/25/2014  . H/O total knee replacement 07/08/2014  . Essential (primary) hypertension 07/08/2009  . Diverticulosis 09/11/2008  . Panic disorder 09/11/2008  . Hypercholesterolemia without hypertriglyceridemia 09/11/2008  . Menopausal symptom 09/11/2008  . Diverticulosis of colon without hemorrhage 09/11/2008    Past Surgical History:  Procedure Laterality Date  . REPLACEMENT TOTAL KNEE Left 07/24/2013   Dr. Marry Guan, [facility]    Her family history includes Cancer in her mother. There is no history of Breast cancer.      Current Outpatient Medications:  .  aspirin 81 MG tablet, Take 81 mg by mouth daily. , Disp: , Rfl:  .  cholecalciferol (VITAMIN D) 1000 UNITS tablet, VITAMIN D, 1000UNIT (Oral Tablet)  1 Every Day for 0 days  Quantity: 0.00;  Refills: 0   Ordered :09-Jan-2010  Darlin Priestly ;  Started 11-Sep-2008 Active, Disp: , Rfl:  .  citalopram (CELEXA) 40 MG tablet, TAKE 1 TABLET BY MOUTH EVERY DAY, Disp: 90 tablet, Rfl: 4 .   lisinopril (PRINIVIL,ZESTRIL) 10 MG tablet, TAKE 1 TABLET BY MOUTH DAILY, Disp: 90 tablet, Rfl: 4 .  meloxicam (MOBIC) 15 MG tablet, Take 15 mg by mouth daily. As needed, Disp: , Rfl:  .  metoprolol succinate (TOPROL-XL) 50 MG 24 hr tablet, Take 1 tablet (50 mg total) by mouth daily. Take with or immediately following a meal., Disp: 90 tablet, Rfl: 3 .  MULTIPLE VITAMIN PO, 1 tablet daily., Disp: , Rfl:  .  pravastatin (PRAVACHOL) 80 MG tablet, TAKE 1 TABLET (80 MG TOTAL) BY MOUTH DAILY., Disp: 90 tablet, Rfl: 3 .  bisoprolol (ZEBETA) 10 MG tablet, Take 1 tablet (10 mg total) by mouth daily. (Patient not taking: Reported on 08/25/2017), Disp: 90 tablet, Rfl: 2  Patient Care Team: Birdie Sons, MD as  PCP - General (Family Medicine) Anell Barr, OD as Consulting Physician (Optometry)     Objective:   Vitals: BP 126/62 (BP Location: Right Arm, Patient Position: Sitting, Cuff Size: Normal)   Pulse 88   Temp 98.3 F (36.8 C) (Oral)   Ht 5\' 7"  (1.702 m)   Wt 167 lb 6.4 oz (75.9 kg)   BMI 26.22 kg/m   Physical Exam   General Appearance:    Alert, cooperative, no distress, appears stated age  Head:    Normocephalic, without obvious abnormality, atraumatic  Eyes:    PERRL, conjunctiva/corneas clear, EOM's intact, fundi    benign, both eyes  Ears:    Normal TM's and external ear canals, both ears  Nose:   Nares normal, septum midline, mucosa normal, no drainage    or sinus tenderness  Throat:   Lips, mucosa, and tongue normal; teeth and gums normal  Neck:   Supple, symmetrical, trachea midline, no adenopathy;    thyroid:  no enlargement/tenderness/nodules; no carotid   bruit or JVD  Back:     Symmetric, no curvature, ROM normal, no CVA tenderness  Lungs:     Clear to auscultation bilaterally, respirations unlabored  Chest Wall:    No tenderness or deformity   Heart:    Regular rate and rhythm, S1 and S2 normal, no murmur, rub   or gallop  Breast Exam:    normal appearance, no  masses or tenderness  Abdomen:     Soft, non-tender, bowel sounds active all four quadrants,    no masses, no organomegaly  Pelvic:    deferred  Extremities:   Extremities normal, atraumatic, no cyanosis or edema  Pulses:   2+ and symmetric all extremities  Skin:   Skin color, texture, turgor normal, no rashes or lesions  Lymph nodes:   Cervical, supraclavicular, and axillary nodes normal  Neurologic:   CNII-XII intact, normal strength, sensation and reflexes    throughout    Activities of Daily Living In your present state of health, do you have any difficulty performing the following activities: 08/25/2017  Hearing? N  Vision? N  Difficulty concentrating or making decisions? N  Walking or climbing stairs? Y  Comment Due to nerve pain in back.  Dressing or bathing? N  Doing errands, shopping? N  Preparing Food and eating ? N  Using the Toilet? N  In the past six months, have you accidently leaked urine? N  Do you have problems with loss of bowel control? N  Managing your Medications? N  Managing your Finances? N  Housekeeping or managing your Housekeeping? N  Some recent data might be hidden    Fall Risk Assessment Fall Risk  08/25/2017 08/21/2016 08/05/2015 01/30/2015  Falls in the past year? No No No No     Depression Screen PHQ 2/9 Scores 08/25/2017 08/25/2017 08/21/2016 08/21/2016  PHQ - 2 Score 0 0 0 0  PHQ- 9 Score 0 - 2 -       Assessment & Plan:    Annual Physical Reviewed patient's Family Medical History Reviewed and updated list of patient's medical providers Assessment of cognitive impairment was done Assessed patient's functional ability Established a written schedule for health screening [place] Completed and Reviewed  Exercise Activities and Dietary recommendations Goals    . DIET - INCREASE WATER INTAKE     Recommend increasing water intake to 3-4 glasses a day.        Immunization History  Administered Date(s) Administered    .  Influenza, High Dose Seasonal PF 11/23/2016  . Influenza,inj,Quad PF,6+ Mos 01/30/2015  . Pneumococcal Conjugate-13 01/30/2015  . Pneumococcal Polysaccharide-23 08/21/2016  . Tdap 09/17/2010    Health Maintenance  Topic Date Due  . INFLUENZA VACCINE  08/26/2017  . DEXA SCAN  08/29/2018  . MAMMOGRAM  10/23/2018  . COLONOSCOPY  07/10/2019  . TETANUS/TDAP  09/16/2020  . Hepatitis C Screening  Completed  . PNA vac Low Risk Adult  Completed     Discussed health benefits of physical activity, and encouraged her to engage in regular exercise appropriate for her age and condition.    ------------------------------------------------------------------------------------------------------------  1. Annual physical exam   2. Essential (primary) hypertension Well controlled.  Continue current medications.   - EKG 12-Lead  3. Hyperglycemia  - Hemoglobin A1c  4. Hypercholesterolemia without hypertriglyceridemia She is tolerating pravastatin well with no adverse effects.   - Comprehensive metabolic panel - Lipid panel  5. Smoking greater than 30 pack years Reviewed recent LDCT discussed benefits of smoking cessation. Printed prescription for bupropion to fill when she is ready to quit.   6. Bruit of right carotid artery  - US Carotid Bilateral; Future  7. Pulmonary emphysema, unspecified emphysema type ([place]) Asymptomatic. Quit smoking.   8. Lumbar radiculopathy Has done well with periodic meloxicam in the past. Refill today.   9. Aortic atherosclerosis ([facility]) Continue statin with LDL goal of 70  10. Adrenal adenoma, left Benign.    Lelon Huh, MD  [facility]

## 2017-08-25 ENCOUNTER — Ambulatory Visit (INDEPENDENT_AMBULATORY_CARE_PROVIDER_SITE_OTHER): Payer: Medicare Other

## 2017-08-25 ENCOUNTER — Ambulatory Visit (INDEPENDENT_AMBULATORY_CARE_PROVIDER_SITE_OTHER): Payer: Medicare Other | Admitting: Family Medicine

## 2017-08-25 ENCOUNTER — Encounter: Payer: Self-pay | Admitting: Family Medicine

## 2017-08-25 VITALS — BP 126/62 | HR 88 | Temp 98.3°F | Ht 67.0 in | Wt 167.4 lb

## 2017-08-25 DIAGNOSIS — F17210 Nicotine dependence, cigarettes, uncomplicated: Secondary | ICD-10-CM

## 2017-08-25 DIAGNOSIS — D3502 Benign neoplasm of left adrenal gland: Secondary | ICD-10-CM

## 2017-08-25 DIAGNOSIS — Z0000 Encounter for general adult medical examination without abnormal findings: Secondary | ICD-10-CM

## 2017-08-25 DIAGNOSIS — R0989 Other specified symptoms and signs involving the circulatory and respiratory systems: Secondary | ICD-10-CM

## 2017-08-25 DIAGNOSIS — R739 Hyperglycemia, unspecified: Secondary | ICD-10-CM | POA: Diagnosis not present

## 2017-08-25 DIAGNOSIS — J439 Emphysema, unspecified: Secondary | ICD-10-CM

## 2017-08-25 DIAGNOSIS — I10 Essential (primary) hypertension: Secondary | ICD-10-CM | POA: Diagnosis not present

## 2017-08-25 DIAGNOSIS — M5416 Radiculopathy, lumbar region: Secondary | ICD-10-CM

## 2017-08-25 DIAGNOSIS — E7800 Pure hypercholesterolemia, unspecified: Secondary | ICD-10-CM | POA: Diagnosis not present

## 2017-08-25 DIAGNOSIS — I700 Atherosclerosis of aorta: Secondary | ICD-10-CM | POA: Insufficient documentation

## 2017-08-25 MED ORDER — BUPROPION HCL ER (SR) 150 MG PO TB12: ORAL_TABLET | ORAL | 6 refills | Status: DC

## 2017-08-25 MED ORDER — MELOXICAM 15 MG PO TABS: 15.0000 mg | ORAL_TABLET | Freq: Every day | ORAL | 5 refills | Status: DC

## 2017-08-25 NOTE — Patient Instructions (Signed)
   The CDC recommends two doses of Shingrix (the shingles vaccine) separated by 2 to 6 months for adults age 68 years and older. I recommend checking with your insurance plan regarding coverage for this vaccine.   

## 2017-08-25 NOTE — Patient Instructions (Addendum)
Angela Winters , Thank you for taking time to come for your Medicare Wellness Visit. I appreciate your ongoing commitment to your health goals. Please review the following plan we discussed and let me know if I can assist you in the future.   Screening recommendations/referrals: Colonoscopy: Up to date Mammogram: Up to date Bone Density: Up to date Recommended yearly ophthalmology/optometry visit for glaucoma screening and checkup Recommended yearly dental visit for hygiene and checkup  Vaccinations: Influenza vaccine: N/A Pneumococcal vaccine: Up to date Tdap vaccine: Up to date Shingles vaccine: Pt declines today.     Advanced directives: Advance directive discussed with you today. Even though you declined this today please call our office should you change your mind and we can give you the proper paperwork for you to fill out.  Conditions/risks identified: Smoking cessation; Recommend increasing water intake to 3-4 glasses a day.   Next appointment: 3:00 PM today with Dr Caryn Section.    Preventive Care 60 Years and Older, Female Preventive care refers to lifestyle choices and visits with your health care provider that can promote health and wellness. What does preventive care include?  A yearly physical exam. This is also called an annual well check.  Dental exams once or twice a year.  Routine eye exams. Ask your health care provider how often you should have your eyes checked.  Personal lifestyle choices, including:  Daily care of your teeth and gums.  Regular physical activity.  Eating a healthy diet.  Avoiding tobacco and drug use.  Limiting alcohol use.  Practicing safe sex.  Taking low-dose aspirin every day.  Taking vitamin and mineral supplements as recommended by your health care provider. What happens during an annual well check? The services and screenings done by your health care provider during your annual well check will depend on your age, overall health,  lifestyle risk factors, and family history of disease. Counseling  Your health care provider may ask you questions about your:  Alcohol use.  Tobacco use.  Drug use.  Emotional well-being.  Home and relationship well-being.  Sexual activity.  Eating habits.  History of falls.  Memory and ability to understand (cognition).  Work and work Statistician.  Reproductive health. Screening  You may have the following tests or measurements:  Height, weight, and BMI.  Blood pressure.  Lipid and cholesterol levels. These may be checked every 5 years, or more frequently if you are over 25 years old.  Skin check.  Lung cancer screening. You may have this screening every year starting at age 72 if you have a 30-pack-year history of smoking and currently smoke or have quit within the past 15 years.  Fecal occult blood test (FOBT) of the stool. You may have this test every year starting at age 38.  Flexible sigmoidoscopy or colonoscopy. You may have a sigmoidoscopy every 5 years or a colonoscopy every 10 years starting at age 14.  Hepatitis C blood test.  Hepatitis B blood test.  Sexually transmitted disease (STD) testing.  Diabetes screening. This is done by checking your blood sugar (glucose) after you have not eaten for a while (fasting). You may have this done every 1-3 years.  Bone density scan. This is done to screen for osteoporosis. You may have this done starting at age 76.  Mammogram. This may be done every 1-2 years. Talk to your health care provider about how often you should have regular mammograms. Talk with your health care provider about your test results, treatment options,  and if necessary, the need for more tests. Vaccines  Your health care provider may recommend certain vaccines, such as:  Influenza vaccine. This is recommended every year.  Tetanus, diphtheria, and acellular pertussis (Tdap, Td) vaccine. You may need a Td booster every 10 years.  Zoster  vaccine. You may need this after age 46.  Pneumococcal 13-valent conjugate (PCV13) vaccine. One dose is recommended after age 79.  Pneumococcal polysaccharide (PPSV23) vaccine. One dose is recommended after age 4. Talk to your health care provider about which screenings and vaccines you need and how often you need them. This information is not intended to replace advice given to you by your health care provider. Make sure you discuss any questions you have with your health care provider. Document Released: 02/08/2015 Document Revised: 10/02/2015 Document Reviewed: 11/13/2014 Elsevier Interactive Patient Education  2017 [place] Prevention in the Home Falls can cause injuries. They can happen to people of all ages. There are many things you can do to make your home safe and to help prevent falls. What can I do on the outside of my home?  Regularly fix the edges of walkways and driveways and fix any cracks.  Remove anything that might make you trip as you walk through a door, such as a raised step or threshold.  Trim any bushes or trees on the path to your home.  Use bright outdoor lighting.  Clear any walking paths of anything that might make someone trip, such as rocks or tools.  Regularly check to see if handrails are loose or broken. Make sure that both sides of any steps have handrails.  Any raised decks and porches should have guardrails on the edges.  Have any leaves, snow, or ice cleared regularly.  Use sand or salt on walking paths during winter.  Clean up any spills in your garage right away. This includes oil or grease spills. What can I do in the bathroom?  Use night lights.  Install grab bars by the toilet and in the tub and shower. Do not use towel bars as grab bars.  Use non-skid mats or decals in the tub or shower.  If you need to sit down in the shower, use a plastic, non-slip stool.  Keep the floor dry. Clean up any water that spills on the  floor as soon as it happens.  Remove soap buildup in the tub or shower regularly.  Attach bath mats securely with double-sided non-slip rug tape.  Do not have throw rugs and other things on the floor that can make you trip. What can I do in the bedroom?  Use night lights.  Make sure that you have a light by your bed that is easy to reach.  Do not use any sheets or blankets that are too big for your bed. They should not hang down onto the floor.  Have a firm chair that has side arms. You can use this for support while you get dressed.  Do not have throw rugs and other things on the floor that can make you trip. What can I do in the kitchen?  Clean up any spills right away.  Avoid walking on wet floors.  Keep items that you use a lot in easy-to-reach places.  If you need to reach something above you, use a strong step stool that has a grab bar.  Keep electrical cords out of the way.  Do not use floor polish or wax that makes floors slippery. If  you must use wax, use non-skid floor wax.  Do not have throw rugs and other things on the floor that can make you trip. What can I do with my stairs?  Do not leave any items on the stairs.  Make sure that there are handrails on both sides of the stairs and use them. Fix handrails that are broken or loose. Make sure that handrails are as long as the stairways.  Check any carpeting to make sure that it is firmly attached to the stairs. Fix any carpet that is loose or worn.  Avoid having throw rugs at the top or bottom of the stairs. If you do have throw rugs, attach them to the floor with carpet tape.  Make sure that you have a light switch at the top of the stairs and the bottom of the stairs. If you do not have them, ask someone to add them for you. What else can I do to help prevent falls?  Wear shoes that:  Do not have high heels.  Have rubber bottoms.  Are comfortable and fit you well.  Are closed at the toe. Do not wear  sandals.  If you use a stepladder:  Make sure that it is fully opened. Do not climb a closed stepladder.  Make sure that both sides of the stepladder are locked into place.  Ask someone to hold it for you, if possible.  Clearly mark and make sure that you can see:  Any grab bars or handrails.  First and last steps.  Where the edge of each step is.  Use tools that help you move around (mobility aids) if they are needed. These include:  Canes.  Walkers.  Scooters.  Crutches.  Turn on the lights when you go into a dark area. Replace any light bulbs as soon as they burn out.  Set up your furniture so you have a clear path. Avoid moving your furniture around.  If any of your floors are uneven, fix them.  If there are any pets around you, be aware of where they are.  Review your medicines with your doctor. Some medicines can make you feel dizzy. This can increase your chance of falling. Ask your doctor what other things that you can do to help prevent falls. This information is not intended to replace advice given to you by your health care provider. Make sure you discuss any questions you have with your health care provider. Document Released: 11/08/2008 Document Revised: 06/20/2015 Document Reviewed: 02/16/2014 Elsevier Interactive Patient Education  2017 [employer].

## 2017-08-25 NOTE — Progress Notes (Signed)
Subjective:   Angela Winters is a 67 y.o. female who presents for Medicare Annual (Subsequent) preventive examination.  Review of Systems:  N/A  Cardiac Risk Factors include: advanced age (>29men, >81 women);hypertension;dyslipidemia;smoking/ tobacco exposure     Objective:     Vitals: BP 126/62 (BP Location: Right Arm)   Pulse 88   Temp 98.3 F (36.8 C) (Oral)   Ht 5\' 7"  (1.702 m)   Wt 167 lb 6.4 oz (75.9 kg)   BMI 26.22 kg/m   Body mass index is 26.22 kg/m.  Advanced Directives 08/25/2017 08/21/2016 08/05/2015  Does Patient Have a Medical Advance Directive? No No No  Does patient want to make changes to medical advance directive? No - Patient declined - -  Would patient like information on creating a medical advance directive? - No - Patient declined -    Tobacco Social History   Tobacco Use  Smoking Status Current Every Day Smoker  . Packs/day: 0.75  . Years: 50.00  . Pack years: 37.50  . Types: Cigarettes  Smokeless Tobacco Never Used     Ready to quit: No Counseling given: No   Clinical Intake:  Pre-visit preparation completed: Yes  Pain : No/denies pain Pain Score: 0-No pain     Nutritional Status: BMI 25 -29 Overweight Nutritional Risks: None Diabetes: No  How often do you need to have someone help you when you read instructions, pamphlets, or other written materials from your doctor or pharmacy?: 1 - Never  Interpreter Needed?: No  Information entered by :: [facility], LPN  Past Medical History:  Diagnosis Date  . Chronic radicular low back pain   . Hypertension   . Sciatica of left side   . Spinal stenosis at L4-L5 level    Past Surgical History:  Procedure Laterality Date  . REPLACEMENT TOTAL KNEE Left 07/24/2013   Dr. Marry Guan, [facility]   Family History  Problem Relation Age of Onset  . Cancer Mother        Colon Cancer  . Breast cancer Neg Hx    Social History   Socioeconomic History  . Marital status: Married    Spouse name: Not  on file  . Number of children: 1  . Years of education: Not on file  . Highest education level: 12th grade  Occupational History  . Occupation: retired  Scientific laboratory technician  . Financial resource strain: Not hard at all  . Food insecurity:    Worry: Never true    Inability: Never true  . Transportation needs:    Medical: No    Non-medical: No  Tobacco Use  . Smoking status: Current Every Day Smoker    Packs/day: 0.75    Years: 50.00    Pack years: 37.50    Types: Cigarettes  . Smokeless tobacco: Never Used  Substance and Sexual Activity  . Alcohol use: Yes    Alcohol/week: 1.2 - 1.8 oz    Types: 2 - 3 Shots of liquor per week  . Drug use: No  . Sexual activity: Not on file  Lifestyle  . Physical activity:    Days per week: Not on file    Minutes per session: Not on file  . Stress: Not at all  Relationships  . Social connections:    Talks on phone: Not on file    Gets together: Not on file    Attends religious service: Not on file    Active member of club or organization: Not on file  Attends meetings of clubs or organizations: Not on file    Relationship status: Not on file  Other Topics Concern  . Not on file  Social History Narrative  . Not on file    Outpatient Encounter Medications as of 08/25/2017  Medication Sig  . aspirin 81 MG tablet Take 81 mg by mouth daily.   . citalopram (CELEXA) 40 MG tablet TAKE 1 TABLET BY MOUTH EVERY DAY  . lisinopril (PRINIVIL,ZESTRIL) 10 MG tablet TAKE 1 TABLET BY MOUTH DAILY  . metoprolol succinate (TOPROL-XL) 50 MG 24 hr tablet Take 1 tablet (50 mg total) by mouth daily. Take with or immediately following a meal.  . MULTIPLE VITAMIN PO 1 tablet daily.  . pravastatin (PRAVACHOL) 80 MG tablet TAKE 1 TABLET (80 MG TOTAL) BY MOUTH DAILY.  . bisoprolol (ZEBETA) 10 MG tablet Take 1 tablet (10 mg total) by mouth daily. (Patient not taking: Reported on 08/25/2017)  . cholecalciferol (VITAMIN D) 1000 UNITS tablet VITAMIN D, 1000UNIT (Oral  Tablet)  1 Every Day for 0 days  Quantity: 0.00;  Refills: 0   Ordered :09-Jan-2010  Darlin Priestly ;  Started 11-Sep-2008 Active  . meloxicam (MOBIC) 15 MG tablet Take 15 mg by mouth daily. As needed  . [DISCONTINUED] pravastatin (PRAVACHOL) 80 MG tablet TAKE 1 TABLET BY MOUTH EVERY DAY   No facility-administered encounter medications on file as of 08/25/2017.     Activities of Daily Living In your present state of health, do you have any difficulty performing the following activities: 08/25/2017  Hearing? N  Vision? N  Difficulty concentrating or making decisions? N  Walking or climbing stairs? Y  Comment Due to nerve pain in back.  Dressing or bathing? N  Doing errands, shopping? N  Preparing Food and eating ? N  Using the Toilet? N  In the past six months, have you accidently leaked urine? N  Do you have problems with loss of bowel control? N  Managing your Medications? N  Managing your Finances? N  Housekeeping or managing your Housekeeping? N  Some recent data might be hidden    Patient Care Team: Birdie Sons, MD as PCP - General (Family Medicine) Anell Barr, OD as Consulting Physician (Optometry)    Assessment:   This is a routine wellness examination for [place].  Exercise Activities and Dietary recommendations Current Exercise Habits: The patient does not participate in regular exercise at present, Exercise limited by: None identified  Goals    . DIET - INCREASE WATER INTAKE     Recommend increasing water intake to 3-4 glasses a day.        Fall Risk Fall Risk  08/25/2017 08/21/2016 08/05/2015 01/30/2015  Falls in the past year? No No No No   Is the patient's home free of loose throw rugs in walkways, pet beds, electrical cords, etc?   yes      Grab bars in the bathroom? no      Handrails on the stairs?   no      Adequate lighting?   yes  Timed Get Up and Go performed: N/A  Depression Screen PHQ 2/9 Scores 08/25/2017 08/25/2017 08/21/2016 08/21/2016    PHQ - 2 Score 0 0 0 0  PHQ- 9 Score 0 - 2 -     Cognitive Function     6CIT Screen 08/25/2017 08/21/2016  What Year? 0 points 0 points  What month? 0 points 0 points  What time? 0 points 0 points  Count back  from 20 0 points 0 points  Months in reverse 0 points 0 points  Repeat phrase 2 points 0 points  Total Score 2 0    Immunization History  Administered Date(s) Administered  . Influenza, High Dose Seasonal PF 11/23/2016  . Influenza,inj,Quad PF,6+ Mos 01/30/2015  . Pneumococcal Conjugate-13 01/30/2015  . Pneumococcal Polysaccharide-23 08/21/2016  . Tdap 09/17/2010    Qualifies for Shingles Vaccine? Due for Shingles vaccine. Declined my offer to administer today. Education has been provided regarding the importance of this vaccine. Pt has been advised to call her insurance company to determine her out of pocket expense. Advised she may also receive this vaccine at her local pharmacy or Health Dept. Verbalized acceptance and understanding.  Screening Tests Health Maintenance  Topic Date Due  . INFLUENZA VACCINE  08/26/2017  . DEXA SCAN  08/29/2018  . MAMMOGRAM  10/23/2018  . COLONOSCOPY  07/10/2019  . TETANUS/TDAP  09/16/2020  . Hepatitis C Screening  Completed  . PNA vac Low Risk Adult  Completed    Cancer Screenings: Lung: Low Dose CT Chest recommended if Age 63-80 years, 30 pack-year currently smoking OR have quit w/in 15years. Patient does qualify, however completed this on 08/23/17. Breast:  Up to date on Mammogram? Yes   Up to date of Bone Density/Dexa? Yes Colorectal: Up to date  Additional Screenings:  Hepatitis C Screening: Up to date     Plan:  I have personally reviewed and addressed the Medicare Annual Wellness questionnaire and have noted the following in the patient's chart:  A. Medical and social history B. Use of alcohol, tobacco or illicit drugs  C. Current medications and supplements D. Functional ability and status E.  Nutritional status F.   Physical activity G. Advance directives H. List of other physicians I.  Hospitalizations, surgeries, and ER visits in previous 12 months J.  [place] such as hearing and vision if needed, cognitive and depression L. Referrals and appointments - none  In addition, I have reviewed and discussed with patient certain preventive protocols, quality metrics, and best practice recommendations. A written personalized care plan for preventive services as well as general preventive health recommendations were provided to patient.  See attached scanned questionnaire for additional information.   Signed,  Fabio Neighbors, LPN Nurse Health Advisor   Nurse Recommendations: None.

## 2017-08-26 ENCOUNTER — Telehealth: Payer: Self-pay

## 2017-08-26 LAB — COMPREHENSIVE METABOLIC PANEL
A/G RATIO: 1.8 (ref 1.2–2.2)
ALK PHOS: 78 IU/L (ref 39–117)
ALT: 21 IU/L (ref 0–32)
AST: 21 IU/L (ref 0–40)
Albumin: 4.6 g/dL (ref 3.6–4.8)
BUN/Creatinine Ratio: 18 (ref 12–28)
BUN: 13 mg/dL (ref 8–27)
Bilirubin Total: 0.2 mg/dL (ref 0.0–1.2)
CO2: 23 mmol/L (ref 20–29)
CREATININE: 0.73 mg/dL (ref 0.57–1.00)
Calcium: 9.8 mg/dL (ref 8.7–10.3)
Chloride: 102 mmol/L (ref 96–106)
GFR calc Af Amer: 98 mL/min/{1.73_m2} (ref >59)
GFR, EST NON AFRICAN AMERICAN: 85 mL/min/{1.73_m2} (ref >59)
Globulin, Total: 2.6 g/dL (ref 1.5–4.5)
Glucose: 100 mg/dL — ABNORMAL HIGH (ref 65–99)
POTASSIUM: 4.8 mmol/L (ref 3.5–5.2)
Sodium: 142 mmol/L (ref 134–144)
Total Protein: 7.2 g/dL (ref 6.0–8.5)

## 2017-08-26 LAB — LIPID PANEL
CHOLESTEROL TOTAL: 224 mg/dL — AB (ref 100–199)
Chol/HDL Ratio: 3.5 ratio (ref 0.0–4.4)
HDL: 64 mg/dL (ref >39)
LDL Calculated: 118 mg/dL — ABNORMAL HIGH (ref 0–99)
TRIGLYCERIDES: 211 mg/dL — AB (ref 0–149)
VLDL Cholesterol Cal: 42 mg/dL — ABNORMAL HIGH (ref 5–40)

## 2017-08-26 LAB — HEMOGLOBIN A1C
Est. average glucose Bld gHb Est-mCnc: 120 mg/dL
Hgb A1c MFr Bld: 5.8 % — ABNORMAL HIGH (ref 4.8–5.6)

## 2017-08-26 MED ORDER — ROSUVASTATIN CALCIUM 40 MG PO TABS: 40.0000 mg | ORAL_TABLET | Freq: Every day | ORAL | 2 refills | Status: DC

## 2017-08-26 NOTE — Telephone Encounter (Signed)
Patient advised. RX sent to [facility]. 4 month follow up scheudled.

## 2017-08-26 NOTE — Telephone Encounter (Signed)
-----   Message from Birdie Sons, MD sent at 08/26/2017  8:49 AM EDT ----- Cholesterol is too high at 224, needs to be under 180. Change pravastatin to rosuvastatin 40mg  once a day, #90, rf x 2. Rest of labs are good. Follow up o.v. And labs in 4 months.

## 2017-08-31 ENCOUNTER — Encounter: Payer: Self-pay | Admitting: *Deleted

## 2017-09-08 ENCOUNTER — Ambulatory Visit
Admission: RE | Admit: 2017-09-08 | Discharge: 2017-09-08 | Disposition: A | Discharge status: Home / Self Care | Payer: Medicare Other | Source: Ambulatory Visit | Attending: Family Medicine | Admitting: Family Medicine

## 2017-09-08 ENCOUNTER — Encounter: Payer: Self-pay | Admitting: Family Medicine

## 2017-09-08 DIAGNOSIS — I6523 Occlusion and stenosis of bilateral carotid arteries: Secondary | ICD-10-CM | POA: Insufficient documentation

## 2017-09-08 DIAGNOSIS — R0989 Other specified symptoms and signs involving the circulatory and respiratory systems: Secondary | ICD-10-CM | POA: Diagnosis present

## 2017-09-20 ENCOUNTER — Other Ambulatory Visit: Payer: Self-pay | Admitting: Family Medicine

## 2017-09-20 DIAGNOSIS — Z1231 Encounter for screening mammogram for malignant neoplasm of breast: Secondary | ICD-10-CM

## 2017-10-25 ENCOUNTER — Ambulatory Visit
Admission: RE | Admit: 2017-10-25 | Discharge: 2017-10-25 | Disposition: A | Discharge status: Home / Self Care | Payer: Medicare Other | Source: Ambulatory Visit | Attending: Family Medicine | Admitting: Family Medicine

## 2017-10-25 DIAGNOSIS — Z1231 Encounter for screening mammogram for malignant neoplasm of breast: Secondary | ICD-10-CM

## 2017-12-28 ENCOUNTER — Ambulatory Visit: Payer: Medicare Other | Admitting: Family Medicine

## 2018-01-06 ENCOUNTER — Other Ambulatory Visit: Payer: Self-pay

## 2018-01-06 ENCOUNTER — Encounter: Payer: Self-pay | Admitting: Family Medicine

## 2018-01-06 ENCOUNTER — Ambulatory Visit: Payer: Medicare Other | Admitting: Family Medicine

## 2018-01-06 ENCOUNTER — Other Ambulatory Visit: Payer: Self-pay | Admitting: Family Medicine

## 2018-01-06 VITALS — BP 126/70 | HR 78 | Temp 97.9°F | Ht 66.0 in | Wt 171.0 lb

## 2018-01-06 DIAGNOSIS — E7800 Pure hypercholesterolemia, unspecified: Secondary | ICD-10-CM | POA: Diagnosis not present

## 2018-01-06 DIAGNOSIS — I10 Essential (primary) hypertension: Secondary | ICD-10-CM

## 2018-01-06 DIAGNOSIS — I700 Atherosclerosis of aorta: Secondary | ICD-10-CM | POA: Diagnosis not present

## 2018-01-06 DIAGNOSIS — F17210 Nicotine dependence, cigarettes, uncomplicated: Secondary | ICD-10-CM

## 2018-01-06 DIAGNOSIS — R739 Hyperglycemia, unspecified: Secondary | ICD-10-CM | POA: Diagnosis not present

## 2018-01-06 MED ORDER — CITALOPRAM HYDROBROMIDE 40 MG PO TABS: 40.0000 mg | ORAL_TABLET | Freq: Every day | ORAL | 4 refills | Status: DC

## 2018-01-06 NOTE — Telephone Encounter (Signed)
[facility], faxed refill request for the following medications:  citalopram (CELEXA) 40 MG tablet  90 day supply  Last Rx: 10/26/16 Please advise. Thanks TNP

## 2018-01-06 NOTE — Progress Notes (Signed)
Patient: Angela Winters Female    DOB: Mar 02, 1950   67 y.o.   MRN: [id number] Visit Date: 01/06/2018  Today's Provider: Lelon Huh, MD   Chief Complaint  Patient presents with  . Hypertension  . Hyperlipidemia  . Hyperglycemia   Subjective:      Hyperglycemia, Follow-up:   Lab Results  Component Value Date   HGBA1C 5.8 (H) 08/25/2017   HGBA1C 6.1 (H) 08/21/2016   HGBA1C 5.9 (H) 08/05/2015   GLUCOSE 100 (H) 08/25/2017   GLUCOSE 124 (H) 08/21/2016   GLUCOSE 115 (H) 08/05/2015    Last seen for for this 5 months ago 08/25/17.  Management since then includes no changes. Current symptoms include none and have been stable.  Weight trend: stable Prior visit with dietician: no Current diet: well balanced Current exercise: no regular exercise  Pertinent Labs:    Component Value Date/Time   CHOL 224 (H) 08/25/2017 1537   TRIG 211 (H) 08/25/2017 1537   CHOLHDL 3.5 08/25/2017 1537   CREATININE 0.73 08/25/2017 1537   CREATININE 0.60 07/26/2013 0530    Wt Readings from Last 3 Encounters:  01/06/18 171 lb (77.6 kg)  08/25/17 167 lb 6.4 oz (75.9 kg)  08/25/17 167 lb 6.4 oz (75.9 kg)     Lipid/Cholesterol, Follow-up:   Last seen for this 5 months ago.  Management changes since that visit include changed Pravastatin to Rosuvastatin 40 mg QD due to extensive vascular disease noted on LDCT chest. . . Last Lipid Panel:    Component Value Date/Time   CHOL 224 (H) 08/25/2017 1537   TRIG 211 (H) 08/25/2017 1537   HDL 64 08/25/2017 1537   CHOLHDL 3.5 08/25/2017 1537   LDLCALC 118 (H) 08/25/2017 1537    Risk factors for vascular disease include hypertension  She reports good compliance with treatment. She is not having side effects.  Current symptoms include none and have been stable. Weight trend: stable Prior visit with dietician: no Current diet: well balanced Current exercise: no regular exercise  Wt Readings from Last 3 Encounters:  01/06/18 171 lb  (77.6 kg)  08/25/17 167 lb 6.4 oz (75.9 kg)  08/25/17 167 lb 6.4 oz (75.9 kg)    Hypertension, follow-up:  BP Readings from Last 3 Encounters:  01/06/18 126/70  08/25/17 126/62  08/25/17 126/62    She was last seen for hypertension 5 months ago 08/25/17.  BP at that visit was 126/62. Management since that visit includes no changes. She reports good compliance with treatment. She is not having side effects.  She is not exercising. She is adherent to low salt diet.   Outside blood pressures are pt not checking at home. She is experiencing none.  Patient denies none.   Cardiovascular risk factors include none.  Use of agents associated with hypertension: none.     Weight trend: stable Wt Readings from Last 3 Encounters:  01/06/18 171 lb (77.6 kg)  08/25/17 167 lb 6.4 oz (75.9 kg)  08/25/17 167 lb 6.4 oz (75.9 kg)    Current diet: well balanced  ------------------------------------------------------------------------   -------------------------------------------------------------------     Allergies  Allergen Reactions  . Codeine Nausea Only     Current Outpatient Medications:  .  aspirin 81 MG tablet, Take 81 mg by mouth daily. , Disp: , Rfl:  .  cholecalciferol (VITAMIN D) 1000 UNITS tablet, VITAMIN D, 1000UNIT (Oral Tablet)  1 Every Day for 0 days  Quantity: 0.00;  Refills: 0   Ordered :  09-Jan-2010  Darlin Priestly ;  Started 11-Sep-2008 Active, Disp: , Rfl:  .  citalopram (CELEXA) 40 MG tablet, TAKE 1 TABLET BY MOUTH EVERY DAY, Disp: 90 tablet, Rfl: 4 .  lisinopril (PRINIVIL,ZESTRIL) 10 MG tablet, TAKE 1 TABLET BY MOUTH DAILY, Disp: 90 tablet, Rfl: 4 .  meloxicam (MOBIC) 15 MG tablet, Take 1 tablet (15 mg total) by mouth daily. As needed, Disp: 30 tablet, Rfl: 5 .  metoprolol succinate (TOPROL-XL) 50 MG 24 hr tablet, Take 1 tablet (50 mg total) by mouth daily. Take with or immediately following a meal., Disp: 90 tablet, Rfl: 3 .  MULTIPLE VITAMIN PO, 1 tablet daily.,  Disp: , Rfl:  .  rosuvastatin (CRESTOR) 40 MG tablet, Take 1 tablet (40 mg total) by mouth daily., Disp: 90 tablet, Rfl: 2 .  buPROPion (WELLBUTRIN SR) 150 MG 12 hr tablet, 1 tablet daily for 3 days, then 1 tablet twice daily. Stop smoking 14 days after starting medication, Disp: 60 tablet, Rfl: 6 .  pravastatin (PRAVACHOL) 80 MG tablet, TAKE 1 TABLET (80 MG TOTAL) BY MOUTH DAILY. (Patient not taking: Reported on 01/06/2018), Disp: 90 tablet, Rfl: 3    Social History   Tobacco Use  . Smoking status: Current Every Day Smoker    Packs/day: 0.75    Years: 50.00    Pack years: 37.50    Types: Cigarettes  . Smokeless tobacco: Never Used  Substance Use Topics  . Alcohol use: Yes    Alcohol/week: 2.0 - 3.0 standard drinks    Types: 2 - 3 Shots of liquor per week   Objective:   BP 126/70 (BP Location: Right Arm, Patient Position: Sitting, Cuff Size: Normal)   Pulse 78   Temp 97.9 F (36.6 C) (Oral)   Ht 5\' 6"  (1.676 m)   Wt 171 lb (77.6 kg)   SpO2 98%   BMI 27.60 kg/m     General Appearance:    Alert, cooperative, no distress  Eyes:    PERRL, conjunctiva/corneas clear, EOM's intact       Lungs:     Clear to auscultation bilaterally, respirations unlabored  Heart:    Regular rate and rhythm  Neurologic:   Awake, alert, oriented x 3. No apparent focal neurological           defect.          Assessment & Plan:     1. Hypercholesterolemia without hypertriglyceridemia Doing well with change to rosuvastatin.  - Comprehensive metabolic panel - Lipid panel  2. Aortic atherosclerosis (HCC) Goal LDL < 70  3. Smoking greater than 30 pack years Again encouraged to stop smoking.  4. Hyperglycemia  - Hemoglobin A1c  5. Essential (primary) hypertension Well controlled.  Continue current medications.    She [facility]

## 2018-01-07 ENCOUNTER — Telehealth: Payer: Self-pay

## 2018-01-07 LAB — LIPID PANEL
CHOL/HDL RATIO: 2.5 ratio (ref 0.0–4.4)
Cholesterol, Total: 170 mg/dL (ref 100–199)
HDL: 67 mg/dL (ref >39)
LDL Calculated: 77 mg/dL (ref 0–99)
Triglycerides: 128 mg/dL (ref 0–149)
VLDL CHOLESTEROL CAL: 26 mg/dL (ref 5–40)

## 2018-01-07 LAB — COMPREHENSIVE METABOLIC PANEL
ALT: 18 IU/L (ref 0–32)
AST: 21 IU/L (ref 0–40)
Albumin/Globulin Ratio: 1.6 (ref 1.2–2.2)
Albumin: 4.3 g/dL (ref 3.6–4.8)
Alkaline Phosphatase: 81 IU/L (ref 39–117)
BUN/Creatinine Ratio: 24 (ref 12–28)
BUN: 17 mg/dL (ref 8–27)
Bilirubin Total: <0.2 mg/dL (ref 0.0–1.2)
CALCIUM: 10 mg/dL (ref 8.7–10.3)
CHLORIDE: 103 mmol/L (ref 96–106)
CO2: 21 mmol/L (ref 20–29)
Creatinine, Ser: 0.72 mg/dL (ref 0.57–1.00)
GFR, EST AFRICAN AMERICAN: 100 mL/min/{1.73_m2} (ref >59)
GFR, EST NON AFRICAN AMERICAN: 86 mL/min/{1.73_m2} (ref >59)
GLUCOSE: 104 mg/dL — AB (ref 65–99)
Globulin, Total: 2.7 g/dL (ref 1.5–4.5)
POTASSIUM: 4.8 mmol/L (ref 3.5–5.2)
Sodium: 143 mmol/L (ref 134–144)
TOTAL PROTEIN: 7 g/dL (ref 6.0–8.5)

## 2018-01-07 LAB — HEMOGLOBIN A1C
Est. average glucose Bld gHb Est-mCnc: 123 mg/dL
HEMOGLOBIN A1C: 5.9 % — AB (ref 4.8–5.6)

## 2018-01-07 NOTE — Telephone Encounter (Signed)
Patient was advised.  

## 2018-01-07 NOTE — Telephone Encounter (Signed)
It looks like this result message was pertaining to labs that were done 4 months ago. I called patient back and advised her of the results from yesterday.

## 2018-01-07 NOTE — Telephone Encounter (Signed)
Pt returned missed call.  Please call pt back. ° °Thanks, °TGH °

## 2018-01-07 NOTE — Telephone Encounter (Signed)
LVMTRC 

## 2018-01-07 NOTE — Telephone Encounter (Signed)
-----   Message from Birdie Sons, MD sent at 01/07/2018  7:51 AM EST ----- Cholesterol is up to 224, needs to be under 200. Need to make sure to take rosuvastatin every day and avoid saturated fats such as red meats in diet. Rest of labs are good. Continue current medications.   Follow up for yearly CPE in august, as scheduled.

## 2018-02-09 ENCOUNTER — Other Ambulatory Visit: Payer: Self-pay | Admitting: Family Medicine

## 2018-02-17 ENCOUNTER — Other Ambulatory Visit: Payer: Self-pay | Admitting: Family Medicine

## 2018-02-17 DIAGNOSIS — M5416 Radiculopathy, lumbar region: Secondary | ICD-10-CM

## 2018-05-15 ENCOUNTER — Other Ambulatory Visit: Payer: Self-pay | Admitting: Family Medicine

## 2018-06-19 ENCOUNTER — Other Ambulatory Visit: Payer: Self-pay | Admitting: Family Medicine

## 2018-08-29 ENCOUNTER — Other Ambulatory Visit: Payer: Self-pay

## 2018-08-29 ENCOUNTER — Ambulatory Visit (INDEPENDENT_AMBULATORY_CARE_PROVIDER_SITE_OTHER): Payer: Medicare Other

## 2018-08-29 DIAGNOSIS — Z0000 Encounter for general adult medical examination without abnormal findings: Secondary | ICD-10-CM | POA: Diagnosis not present

## 2018-08-29 DIAGNOSIS — M8580 Other specified disorders of bone density and structure, unspecified site: Secondary | ICD-10-CM | POA: Diagnosis not present

## 2018-08-29 NOTE — Progress Notes (Signed)
Subjective:   Angela Winters is a 68 y.o. female who presents for Medicare Annual (Subsequent) preventive examination.    This visit is being conducted through telemedicine due to the COVID-19 pandemic. This patient has given me verbal consent via doximity to conduct this visit, patient states they are participating from their home address. Some vital signs may be absent or patient reported.    Patient identification: identified by name, DOB, and current address  Review of Systems:  N/A  Cardiac Risk Factors include: advanced age (>19men, >79 women);dyslipidemia;smoking/ tobacco exposure;hypertension     Objective:     Vitals: There were no vitals taken for this visit.  There is no height or weight on file to calculate BMI. Unable to obtain vitals due to visit being conducted via telephonically.   Advanced Directives 08/29/2018 08/25/2017 08/21/2016 08/05/2015  Does Patient Have a Medical Advance Directive? No No No No  Does patient want to make changes to medical advance directive? - No - Patient declined - -  Would patient like information on creating a medical advance directive? No - Patient declined - No - Patient declined -    Tobacco Social History   Tobacco Use  Smoking Status Current Every Day Smoker  . Packs/day: 0.75  . Years: 50.00  . Pack years: 37.50  . Types: Cigarettes  Smokeless Tobacco Never Used     Ready to quit: Not Answered Counseling given: Not Answered   Clinical Intake:  Pre-visit preparation completed: Yes  Pain : No/denies pain Pain Score: 0-No pain     Nutritional Risks: None Diabetes: No  How often do you need to have someone help you when you read instructions, pamphlets, or other written materials from your doctor or pharmacy?: 1 - Never  Interpreter Needed?: No  Information entered by :: [facility], LPN  Past Medical History:  Diagnosis Date  . Chronic radicular low back pain   . Hypertension   . Sciatica of left side   .  Spinal stenosis at L4-L5 level    Past Surgical History:  Procedure Laterality Date  . REPLACEMENT TOTAL KNEE Left 07/24/2013   Dr. Marry Guan, [facility]   Family History  Problem Relation Age of Onset  . Cancer Mother        Colon Cancer  . Breast cancer Neg Hx    Social History   Socioeconomic History  . Marital status: Married    Spouse name: Not on file  . Number of children: 1  . Years of education: Not on file  . Highest education level: 12th grade  Occupational History  . Occupation: retired  Scientific laboratory technician  . Financial resource strain: Not hard at all  . Food insecurity    Worry: Never true    Inability: Never true  . Transportation needs    Medical: No    Non-medical: No  Tobacco Use  . Smoking status: Current Every Day Smoker    Packs/day: 0.75    Years: 50.00    Pack years: 37.50    Types: Cigarettes  . Smokeless tobacco: Never Used  Substance and Sexual Activity  . Alcohol use: Yes    Alcohol/week: 2.0 - 3.0 standard drinks    Types: 2 - 3 Shots of liquor per week  . Drug use: No  . Sexual activity: Not on file  Lifestyle  . Physical activity    Days per week: 0 days    Minutes per session: 0 min  . Stress: Not at all  Relationships  . Social Herbalist on phone: Patient refused    Gets together: Patient refused    Attends religious service: Patient refused    Active member of club or organization: Patient refused    Attends meetings of clubs or organizations: Patient refused    Relationship status: Patient refused  Other Topics Concern  . Not on file  Social History Narrative  . Not on file    Outpatient Encounter Medications as of 08/29/2018  Medication Sig  . aspirin 81 MG tablet Take 81 mg by mouth daily.   Marland Kitchen buPROPion (WELLBUTRIN SR) 150 MG 12 hr tablet 1 tablet daily for 3 days, then 1 tablet twice daily. Stop smoking 14 days after starting medication (Patient taking differently: Take 150 mg by mouth 2 (two) times daily. )  .  cholecalciferol (VITAMIN D) 1000 UNITS tablet VITAMIN D, 1000UNIT (Oral Tablet)  1 Every Day for 0 days  Quantity: 0.00;  Refills: 0   Ordered :09-Jan-2010  Darlin Priestly ;  Started 11-Sep-2008 Active  . citalopram (CELEXA) 40 MG tablet Take 1 tablet (40 mg total) by mouth daily.  Marland Kitchen lisinopril (ZESTRIL) 10 MG tablet TAKE 1 TABLET BY MOUTH EVERY DAY  . meloxicam (MOBIC) 15 MG tablet TAKE 1 TABLET (15 MG TOTAL) BY MOUTH DAILY. AS NEEDED  . metoprolol succinate (TOPROL-XL) 50 MG 24 hr tablet TAKE 1 TABLET (50 MG TOTAL) BY MOUTH DAILY. TAKE WITH OR IMMEDIATELY FOLLOWING A MEAL.  . MULTIPLE VITAMIN PO 1 tablet daily.  . rosuvastatin (CRESTOR) 40 MG tablet TAKE 1 TABLET BY MOUTH EVERY DAY   No facility-administered encounter medications on file as of 08/29/2018.     Activities of Daily Living In your present state of health, do you have any difficulty performing the following activities: 08/29/2018  Hearing? N  Vision? N  Difficulty concentrating or making decisions? N  Walking or climbing stairs? N  Dressing or bathing? N  Doing errands, shopping? N  Preparing Food and eating ? N  Using the Toilet? N  In the past six months, have you accidently leaked urine? N  Do you have problems with loss of bowel control? N  Managing your Medications? N  Managing your Finances? N  Housekeeping or managing your Housekeeping? N  Some recent data might be hidden    Patient Care Team: Birdie Sons, MD as PCP - General (Family Medicine) Anell Barr, OD as Consulting Physician (Optometry)    Assessment:   This is a routine wellness examination for Media.  Exercise Activities and Dietary recommendations Current Exercise Habits: The patient does not participate in regular exercise at present, Exercise limited by: None identified  Goals    . Increase water intake     Recommend increasing water intake to 4-6 glasses a day.     . Quit Smoking       Fall Risk: Fall Risk  08/29/2018  01/06/2018 08/25/2017 08/21/2016 08/05/2015  Falls in the past year? 0 0 No No No    FALL RISK PREVENTION PERTAINING TO THE HOME:  Any stairs in or around the home? Yes  If so, are there any without handrails? No   Home free of loose throw rugs in walkways, pet beds, electrical cords, etc? Yes  Adequate lighting in your home to reduce risk of falls? Yes   ASSISTIVE DEVICES UTILIZED TO PREVENT FALLS:  Life alert? No  Use of a cane, walker or w/c? No  Grab bars in the  bathroom? No  Shower chair or bench in shower? No  Elevated toilet seat or a handicapped toilet? No    TIMED UP AND GO:  Was the test performed? No .    Depression Screen PHQ 2/9 Scores 08/29/2018 01/06/2018 08/25/2017 08/25/2017  PHQ - 2 Score 0 0 0 0  PHQ- 9 Score - - 0 -     Cognitive Function     6CIT Screen 08/29/2018 08/25/2017 08/21/2016  What Year? 0 points 0 points 0 points  What month? 0 points 0 points 0 points  What time? 0 points 0 points 0 points  Count back from 20 0 points 0 points 0 points  Months in reverse 0 points 0 points 0 points  Repeat phrase 0 points 2 points 0 points  Total Score 0 2 0    Immunization History  Administered Date(s) Administered  . Influenza, High Dose Seasonal PF 11/23/2016, 12/28/2017  . Influenza,inj,Quad PF,6+ Mos 01/30/2015  . Pneumococcal Conjugate-13 01/30/2015  . Pneumococcal Polysaccharide-23 08/21/2016  . Tdap 09/17/2010    Qualifies for Shingles Vaccine? Yes . Due for Shingrix. Education has been provided regarding the importance of this vaccine. Pt has been advised to call insurance company to determine out of pocket expense. Advised may also receive vaccine at local pharmacy or Health Dept. Verbalized acceptance and understanding.  Tdap: Up to date  Flu Vaccine: Up to date  Pneumococcal Vaccine: Completed series  Screening Tests Health Maintenance  Topic Date Due  . DEXA SCAN  08/29/2018  . INFLUENZA VACCINE  08/27/2018  . COLONOSCOPY  07/10/2019   . MAMMOGRAM  10/26/2019  . TETANUS/TDAP  09/16/2020  . Hepatitis C Screening  Completed  . PNA vac Low Risk Adult  Completed    Cancer Screenings:  Colorectal Screening: Completed 09/08/16. Repeat every 5 years.  Mammogram: Completed 10/25/17.   Bone Density: Completed 08/29/15. Results reflect OSTEOPENIA. Repeat every 3 years. Ordered today. Pt provided with contact info and advised to call to schedule appt. Pt aware the office will call re: appt.  Lung Cancer Screening: (Low Dose CT Chest recommended if Age 7-80 years, 30 pack-year currently smoking OR have quit w/in 15years.) does qualify. An Epic message has been sent to Burgess Estelle, RN (Oncology Nurse Navigator) regarding the possible need for this exam. Raquel Sarna will review the patient's chart to determine if the patient truly qualifies for the exam. If the patient qualifies, Raquel Sarna will order the Low Dose CT of the chest to facilitate the scheduling of this exam.  Additional Screening:  Hepatitis C Screening: Up to date  Vision Screening: Recommended annual ophthalmology exams for early detection of glaucoma and other disorders of the eye.  Dental Screening: Recommended annual dental exams for proper oral hygiene  Community Resource Referral:  CRR required this visit?  No       Plan:  I have personally reviewed and addressed the Medicare Annual Wellness questionnaire and have noted the following in the patient's chart:  A. Medical and social history B. Use of alcohol, tobacco or illicit drugs  C. Current medications and supplements D. Functional ability and status E.  Nutritional status F.  Physical activity G. Advance directives H. List of other physicians I.  Hospitalizations, surgeries, and ER visits in previous 12 months J.  [place] such as hearing and vision if needed, cognitive and depression L. Referrals and appointments   In addition, I have reviewed and discussed with patient certain  preventive protocols, quality metrics, and best  practice recommendations. A written personalized care plan for preventive services as well as general preventive health recommendations were provided to patient. Nurse Health Advisor  Signed,    Glinda Natzke [place], [place]  04/02/5434 Nurse Health Advisor   Nurse Notes: None.

## 2018-08-29 NOTE — Patient Instructions (Addendum)
Angela Winters , Thank you for taking time to come for your Medicare Wellness Visit. I appreciate your ongoing commitment to your health goals. Please review the following plan we discussed and let me know if I can assist you in the future.   Screening recommendations/referrals: Colonoscopy: Up to date, due 08/2021 Mammogram: Up to date, due 09/2019 Bone Density: Ordered today. Pt provided with contact info and advised to call to schedule appt. Pt aware the office will call re: appt. Recommended yearly dental visit for hygiene and checkup  Vaccinations: Influenza vaccine: Up to date Pneumococcal vaccine: Completed series Tdap vaccine: Up to date Shingles vaccine: Pt declines today.     Advanced directives: Advance directive discussed with you today. Even though you declined this today please call our office should you change your mind and we can give you the proper paperwork for you to fill out.  Conditions/risks identified: Continue to increase water intake to 6-8 8 oz glasses a day.   Next appointment: 08/31/18 @ 2:00 PM with Dr Caryn Section. Declined scheduling an AWV for 2021 at this time.    Preventive Care 69 Years and Older, Female Preventive care refers to lifestyle choices and visits with your health care provider that can promote health and wellness. What does preventive care include?  A yearly physical exam. This is also called an annual well check.  Dental exams once or twice a year.  Routine eye exams. Ask your health care provider how often you should have your eyes checked.  Personal lifestyle choices, including:  Daily care of your teeth and gums.  Regular physical activity.  Eating a healthy diet.  Avoiding tobacco and drug use.  Limiting alcohol use.  Practicing safe sex.  Taking low-dose aspirin every day.  Taking vitamin and mineral supplements as recommended by your health care provider. What happens during an annual well check? The services and screenings  done by your health care provider during your annual well check will depend on your age, overall health, lifestyle risk factors, and family history of disease. Counseling  Your health care provider may ask you questions about your:  Alcohol use.  Tobacco use.  Drug use.  Emotional well-being.  Home and relationship well-being.  Sexual activity.  Eating habits.  History of falls.  Memory and ability to understand (cognition).  Work and work Statistician.  Reproductive health. Screening  You may have the following tests or measurements:  Height, weight, and BMI.  Blood pressure.  Lipid and cholesterol levels. These may be checked every 5 years, or more frequently if you are over 32 years old.  Skin check.  Lung cancer screening. You may have this screening every year starting at age 17 if you have a 30-pack-year history of smoking and currently smoke or have quit within the past 15 years.  Fecal occult blood test (FOBT) of the stool. You may have this test every year starting at age 66.  Flexible sigmoidoscopy or colonoscopy. You may have a sigmoidoscopy every 5 years or a colonoscopy every 10 years starting at age 37.  Hepatitis C blood test.  Hepatitis B blood test.  Sexually transmitted disease (STD) testing.  Diabetes screening. This is done by checking your blood sugar (glucose) after you have not eaten for a while (fasting). You may have this done every 1-3 years.  Bone density scan. This is done to screen for osteoporosis. You may have this done starting at age 15.  Mammogram. This may be done every 1-2 years.  Talk to your health care provider about how often you should have regular mammograms. Talk with your health care provider about your test results, treatment options, and if necessary, the need for more tests. Vaccines  Your health care provider may recommend certain vaccines, such as:  Influenza vaccine. This is recommended every year.  Tetanus,  diphtheria, and acellular pertussis (Tdap, Td) vaccine. You may need a Td booster every 10 years.  Zoster vaccine. You may need this after age 69  Pneumococcal 13-valent conjugate (PCV13) vaccine. One dose is recommended after age 69  Pneumococcal polysaccharide (PPSV23) vaccine. One dose is recommended after age 69 Talk to your health care provider about which screenings and vaccines you need and how often you need them. This information is not intended to replace advice given to you by your health care provider. Make sure you discuss any questions you have with your health care provider. Document Released: 02/08/2015 Document Revised: 10/02/2015 Document Reviewed: 11/13/2014 Elsevier Interactive Patient Education  2017 [place] Prevention in the Home Falls can cause injuries. They can happen to people of all ages. There are many things you can do to make your home safe and to help prevent falls. What can I do on the outside of my home?  Regularly fix the edges of walkways and driveways and fix any cracks.  Remove anything that might make you trip as you walk through a door, such as a raised step or threshold.  Trim any bushes or trees on the path to your home.  Use bright outdoor lighting.  Clear any walking paths of anything that might make someone trip, such as rocks or tools.  Regularly check to see if handrails are loose or broken. Make sure that both sides of any steps have handrails.  Any raised decks and porches should have guardrails on the edges.  Have any leaves, snow, or ice cleared regularly.  Use sand or salt on walking paths during winter.  Clean up any spills in your garage right away. This includes oil or grease spills. What can I do in the bathroom?  Use night lights.  Install grab bars by the toilet and in the tub and shower. Do not use towel bars as grab bars.  Use non-skid mats or decals in the tub or shower.  If you need to sit down in  the shower, use a plastic, non-slip stool.  Keep the floor dry. Clean up any water that spills on the floor as soon as it happens.  Remove soap buildup in the tub or shower regularly.  Attach bath mats securely with double-sided non-slip rug tape.  Do not have throw rugs and other things on the floor that can make you trip. What can I do in the bedroom?  Use night lights.  Make sure that you have a light by your bed that is easy to reach.  Do not use any sheets or blankets that are too big for your bed. They should not hang down onto the floor.  Have a firm chair that has side arms. You can use this for support while you get dressed.  Do not have throw rugs and other things on the floor that can make you trip. What can I do in the kitchen?  Clean up any spills right away.  Avoid walking on wet floors.  Keep items that you use a lot in easy-to-reach places.  If you need to reach something above you, use a strong step stool  that has a grab bar.  Keep electrical cords out of the way.  Do not use floor polish or wax that makes floors slippery. If you must use wax, use non-skid floor wax.  Do not have throw rugs and other things on the floor that can make you trip. What can I do with my stairs?  Do not leave any items on the stairs.  Make sure that there are handrails on both sides of the stairs and use them. Fix handrails that are broken or loose. Make sure that handrails are as long as the stairways.  Check any carpeting to make sure that it is firmly attached to the stairs. Fix any carpet that is loose or worn.  Avoid having throw rugs at the top or bottom of the stairs. If you do have throw rugs, attach them to the floor with carpet tape.  Make sure that you have a light switch at the top of the stairs and the bottom of the stairs. If you do not have them, ask someone to add them for you. What else can I do to help prevent falls?  Wear shoes that:  Do not have high  heels.  Have rubber bottoms.  Are comfortable and fit you well.  Are closed at the toe. Do not wear sandals.  If you use a stepladder:  Make sure that it is fully opened. Do not climb a closed stepladder.  Make sure that both sides of the stepladder are locked into place.  Ask someone to hold it for you, if possible.  Clearly mark and make sure that you can see:  Any grab bars or handrails.  First and last steps.  Where the edge of each step is.  Use tools that help you move around (mobility aids) if they are needed. These include:  Canes.  Walkers.  Scooters.  Crutches.  Turn on the lights when you go into a dark area. Replace any light bulbs as soon as they burn out.  Set up your furniture so you have a clear path. Avoid moving your furniture around.  If any of your floors are uneven, fix them.  If there are any pets around you, be aware of where they are.  Review your medicines with your doctor. Some medicines can make you feel dizzy. This can increase your chance of falling. Ask your doctor what other things that you can do to help prevent falls. This information is not intended to replace advice given to you by your health care provider. Make sure you discuss any questions you have with your health care provider. Document Released: 11/08/2008 Document Revised: 06/20/2015 Document Reviewed: 02/16/2014 Elsevier Interactive Patient Education  2017 [employer].

## 2018-08-30 ENCOUNTER — Telehealth: Payer: Self-pay | Admitting: *Deleted

## 2018-08-30 NOTE — Telephone Encounter (Signed)
  Patient has been notified that lung cancer screening CT scan is due currently or will be in near future. Confirmed that patient is within the appropriate age range, and asymptomatic, (no signs or symptoms of lung cancer). Patient denies illness that would prevent curative treatment for lung cancer if found. Verified smoking history (current smoker; 0.75 ppd). Patient is agreeable for CT scan being scheduled and prefers her appointment to be early afternoon.

## 2018-08-31 ENCOUNTER — Other Ambulatory Visit: Payer: Self-pay | Admitting: *Deleted

## 2018-08-31 ENCOUNTER — Ambulatory Visit: Payer: Medicare Other

## 2018-08-31 ENCOUNTER — Encounter: Payer: Medicare Other | Admitting: Family Medicine

## 2018-08-31 DIAGNOSIS — Z122 Encounter for screening for malignant neoplasm of respiratory organs: Secondary | ICD-10-CM

## 2018-08-31 DIAGNOSIS — Z87891 Personal history of nicotine dependence: Secondary | ICD-10-CM

## 2018-09-07 ENCOUNTER — Other Ambulatory Visit: Payer: Self-pay

## 2018-09-07 ENCOUNTER — Ambulatory Visit
Admission: RE | Admit: 2018-09-07 | Discharge: 2018-09-07 | Disposition: A | Discharge status: Home / Self Care | Payer: Medicare Other | Source: Ambulatory Visit | Attending: Oncology | Admitting: Oncology

## 2018-09-07 DIAGNOSIS — Z87891 Personal history of nicotine dependence: Secondary | ICD-10-CM | POA: Insufficient documentation

## 2018-09-07 DIAGNOSIS — Z122 Encounter for screening for malignant neoplasm of respiratory organs: Secondary | ICD-10-CM | POA: Diagnosis not present

## 2018-09-09 ENCOUNTER — Encounter: Payer: Self-pay | Admitting: *Deleted

## 2018-11-04 ENCOUNTER — Encounter: Payer: Self-pay | Admitting: Family Medicine

## 2018-11-04 ENCOUNTER — Other Ambulatory Visit: Payer: Self-pay

## 2018-11-04 ENCOUNTER — Ambulatory Visit (INDEPENDENT_AMBULATORY_CARE_PROVIDER_SITE_OTHER): Payer: Medicare Other | Admitting: Family Medicine

## 2018-11-04 VITALS — BP 130/68 | HR 77 | Temp 97.1°F | Resp 16 | Wt 168.0 lb

## 2018-11-04 DIAGNOSIS — M8580 Other specified disorders of bone density and structure, unspecified site: Secondary | ICD-10-CM

## 2018-11-04 DIAGNOSIS — R0989 Other specified symptoms and signs involving the circulatory and respiratory systems: Secondary | ICD-10-CM

## 2018-11-04 DIAGNOSIS — I998 Other disorder of circulatory system: Secondary | ICD-10-CM | POA: Diagnosis not present

## 2018-11-04 DIAGNOSIS — Z0000 Encounter for general adult medical examination without abnormal findings: Secondary | ICD-10-CM | POA: Diagnosis not present

## 2018-11-04 DIAGNOSIS — J439 Emphysema, unspecified: Secondary | ICD-10-CM

## 2018-11-04 DIAGNOSIS — I700 Atherosclerosis of aorta: Secondary | ICD-10-CM

## 2018-11-04 DIAGNOSIS — I6523 Occlusion and stenosis of bilateral carotid arteries: Secondary | ICD-10-CM | POA: Diagnosis not present

## 2018-11-04 DIAGNOSIS — R739 Hyperglycemia, unspecified: Secondary | ICD-10-CM

## 2018-11-04 DIAGNOSIS — Z23 Encounter for immunization: Secondary | ICD-10-CM

## 2018-11-04 DIAGNOSIS — I10 Essential (primary) hypertension: Secondary | ICD-10-CM

## 2018-11-04 DIAGNOSIS — E2839 Other primary ovarian failure: Secondary | ICD-10-CM

## 2018-11-04 DIAGNOSIS — E7800 Pure hypercholesterolemia, unspecified: Secondary | ICD-10-CM

## 2018-11-04 DIAGNOSIS — F17210 Nicotine dependence, cigarettes, uncomplicated: Secondary | ICD-10-CM

## 2018-11-04 NOTE — Patient Instructions (Addendum)
.   Please review the attached list of medications and notify my office if there are any errors.   . Please bring all of your medications to every appointment so we can make sure that our medication list is the same as yours.    The CDC recommends two doses of Shingrix (the shingles vaccine) separated by 2 to 6 months for adults age 69 years and older. I recommend checking with your insurance plan regarding coverage for this vaccine.   

## 2018-11-04 NOTE — Progress Notes (Signed)
Patient: Angela Winters, Female    DOB: 02/22/1950, 68 y.o.   MRN: [id number] Visit Date: 11/04/2018  Today's Provider: Lelon Huh, MD   Chief Complaint  Patient presents with  . Annual Exam   Subjective:   AWV-08/29/2018  Complete Physical Angela Winters is a 68 y.o. female. She feels fairly well. She reports no regular exercising. She reports she is sleeping fairly well.  -----------------------------------------------------------  Hypertension, follow-up:  BP Readings from Last 3 Encounters:  01/06/18 126/70  08/25/17 126/62  08/25/17 126/62    She was last seen for hypertension 10 months ago.  BP at that visit was 126/70. Management since that visit includes none. She reports good compliance with treatment. She is not having side effects.  She is not exercising. She is not adherent to low salt diet.   Outside blood pressures are not being checked. She is experiencing none.  Patient denies chest pain, chest pressure/discomfort, claudication, dyspnea, exertional chest pressure/discomfort, fatigue, irregular heart beat, lower extremity edema, near-syncope, orthopnea, palpitations, paroxysmal nocturnal dyspnea, syncope and tachypnea.   Cardiovascular risk factors include advanced age (older than 52 for men, 62 for women) and hypertension.  Use of agents associated with hypertension: NSAIDS.     Weight trend: fluctuating a bit Wt Readings from Last 3 Encounters:  09/07/18 175 lb (79.4 kg)  01/06/18 171 lb (77.6 kg)  08/25/17 167 lb 6.4 oz (75.9 kg)    Current diet: in general, a "healthy" diet    ------------------------------------------------------------------------  Lipid/Cholesterol, Follow-up:   Last seen for this10 months ago.  Management changes since that visit include none. . Last Lipid Panel:    Component Value Date/Time   CHOL 170 01/06/2018 1045   TRIG 128 01/06/2018 1045   HDL 67 01/06/2018 1045   CHOLHDL 2.5 01/06/2018 1045   LDLCALC 77  01/06/2018 1045    Risk factors for vascular disease include hypercholesterolemia and hypertension  She reports good compliance with treatment. She is not having side effects.  Current symptoms include none and have been stable. Weight trend: fluctuating a bit Prior visit with dietician: no Current diet: in general, a "healthy" diet   Current exercise: none  Wt Readings from Last 3 Encounters:  09/07/18 175 lb (79.4 kg)  01/06/18 171 lb (77.6 kg)  08/25/17 167 lb 6.4 oz (75.9 kg)    -------------------------------------------------------------------  Hyperglycemia, Follow-up:   Lab Results  Component Value Date   HGBA1C 5.9 (H) 01/06/2018   HGBA1C 5.8 (H) 08/25/2017   HGBA1C 6.1 (H) 08/21/2016   GLUCOSE 104 (H) 01/06/2018   GLUCOSE 100 (H) 08/25/2017   GLUCOSE 124 (H) 08/21/2016    Last seen for for this 10 months ago.  Management since then includes none. Current symptoms include none and have been stable.  Weight trend: fluctuating a bit Prior visit with dietician: no Current diet: in general, a "healthy" diet   Current exercise: none  Pertinent Labs:    Component Value Date/Time   CHOL 170 01/06/2018 1045   TRIG 128 01/06/2018 1045   CHOLHDL 2.5 01/06/2018 1045   CREATININE 0.72 01/06/2018 1045   CREATININE 0.60 07/26/2013 0530    Wt Readings from Last 3 Encounters:  09/07/18 175 lb (79.4 kg)  01/06/18 171 lb (77.6 kg)  08/25/17 167 lb 6.4 oz (75.9 kg)     Review of Systems  Constitutional: Negative for chills, fatigue and fever.  HENT: Negative for congestion, ear pain, rhinorrhea, sneezing and sore throat.   Eyes:  Negative.  Negative for pain and redness.  Respiratory: Negative for cough, shortness of breath and wheezing.   Cardiovascular: Negative for chest pain and leg swelling.  Gastrointestinal: Negative for abdominal pain, blood in stool, constipation, diarrhea and nausea.  Endocrine: Negative for polydipsia and polyphagia.  Genitourinary:  Negative.  Negative for dysuria, flank pain, hematuria, pelvic pain, vaginal bleeding and vaginal discharge.  Musculoskeletal: Positive for back pain, myalgias, neck pain and neck stiffness. Negative for arthralgias, gait problem and joint swelling.  Skin: Negative for rash.  Neurological: Negative.  Negative for dizziness, tremors, seizures, weakness, light-headedness, numbness and headaches.  Hematological: Negative for adenopathy.  Psychiatric/Behavioral: Negative.  Negative for behavioral problems, confusion and dysphoric mood. The patient is not nervous/anxious and is not hyperactive.     Social History   Socioeconomic History  . Marital status: Married    Spouse name: Not on file  . Number of children: 1  . Years of education: Not on file  . Highest education level: 12th grade  Occupational History  . Occupation: retired  Scientific laboratory technician  . Financial resource strain: Not hard at all  . Food insecurity    Worry: Never true    Inability: Never true  . Transportation needs    Medical: No    Non-medical: No  Tobacco Use  . Smoking status: Current Every Day Smoker    Packs/day: 0.75    Years: 50.00    Pack years: 37.50    Types: Cigarettes  . Smokeless tobacco: Never Used  Substance and Sexual Activity  . Alcohol use: Yes    Alcohol/week: 2.0 - 3.0 standard drinks    Types: 2 - 3 Shots of liquor per week  . Drug use: No  . Sexual activity: Not on file  Lifestyle  . Physical activity    Days per week: 0 days    Minutes per session: 0 min  . Stress: Not at all  Relationships  . Social Herbalist on phone: Patient refused    Gets together: Patient refused    Attends religious service: Patient refused    Active member of club or organization: Patient refused    Attends meetings of clubs or organizations: Patient refused    Relationship status: Patient refused  . Intimate partner violence    Fear of current or ex partner: Patient refused    Emotionally abused:  Patient refused    Physically abused: Patient refused    Forced sexual activity: Patient refused  Other Topics Concern  . Not on file  Social History Narrative  . Not on file    Past Medical History:  Diagnosis Date  . Chronic radicular low back pain   . Hypertension   . Sciatica of left side   . Spinal stenosis at L4-L5 level      Patient Active Problem List   Diagnosis Date Noted  . Carotid atherosclerosis, bilateral 09/08/2017  . Aortic atherosclerosis ([place]) 08/25/2017  . Adrenal adenoma, left 08/25/2017  . Pulmonary emphysema ([place]) 08/21/2016  . Osteopenia 08/29/2015  . Smoking greater than 30 pack years 08/08/2014  . Allergic rhinitis 07/25/2014  . Lumbar radiculopathy 07/25/2014  . Hyperglycemia 07/25/2014  . Sciatica of left side 07/25/2014  . H/O total knee replacement 07/08/2014  . Essential (primary) hypertension 07/08/2009  . Diverticulosis 09/11/2008  . Panic disorder 09/11/2008  . Hypercholesterolemia without hypertriglyceridemia 09/11/2008  . Menopausal symptom 09/11/2008    Past Surgical History:  Procedure Laterality Date  . REPLACEMENT  TOTAL KNEE Left 07/24/2013   Dr. Marry Guan, [facility]    Her family history includes Cancer in her mother. There is no history of Breast cancer.   Current Outpatient Medications:  .  aspirin 81 MG tablet, Take 81 mg by mouth daily. , Disp: , Rfl:  .  cholecalciferol (VITAMIN D) 1000 UNITS tablet, VITAMIN D, 1000UNIT (Oral Tablet)  1 Every Day for 0 days  Quantity: 0.00;  Refills: 0   Ordered :09-Jan-2010  Darlin Priestly ;  Started 11-Sep-2008 Active, Disp: , Rfl:  .  citalopram (CELEXA) 40 MG tablet, Take 1 tablet (40 mg total) by mouth daily., Disp: 90 tablet, Rfl: 4 .  lisinopril (ZESTRIL) 10 MG tablet, TAKE 1 TABLET BY MOUTH EVERY DAY, Disp: 90 tablet, Rfl: 4 .  meloxicam (MOBIC) 15 MG tablet, TAKE 1 TABLET (15 MG TOTAL) BY MOUTH DAILY. AS NEEDED, Disp: 90 tablet, Rfl: 2 .  metoprolol succinate (TOPROL-XL) 50 MG 24 hr  tablet, TAKE 1 TABLET (50 MG TOTAL) BY MOUTH DAILY. TAKE WITH OR IMMEDIATELY FOLLOWING A MEAL., Disp: 90 tablet, Rfl: 4 .  MULTIPLE VITAMIN PO, 1 tablet daily., Disp: , Rfl:  .  rosuvastatin (CRESTOR) 40 MG tablet, TAKE 1 TABLET BY MOUTH EVERY DAY, Disp: 90 tablet, Rfl: 4 .  buPROPion (WELLBUTRIN SR) 150 MG 12 hr tablet, 1 tablet daily for 3 days, then 1 tablet twice daily. Stop smoking 14 days after starting medication (Patient taking differently: Take 150 mg by mouth 2 (two) times daily. ), Disp: 60 tablet, Rfl: 6  Patient Care Team: Birdie Sons, MD as PCP - General (Family Medicine) Anell Barr, OD as Consulting Physician (Optometry)     Objective:    Vitals:   Vitals:   11/04/18 1017 11/04/18 1021  BP: 90/60 (left brachial) 130/68 (right brachial)  Pulse: 77   Resp: 16   Temp: (!) 97.1 F (36.2 C)   TempSrc: Temporal   SpO2: 97%   Weight: 168 lb (76.2 kg)   Body mass index is 26.71 kg/m.  Physical Exam   General Appearance:    Alert, cooperative, no distress, appears stated age  Head:    Normocephalic, without obvious abnormality, atraumatic  Eyes:    PERRL, conjunctiva/corneas clear, EOM's intact, fundi    benign, both eyes  Ears:    Normal TM's and external ear canals, both ears  Nose:   Nares normal, septum midline, mucosa normal, no drainage    or sinus tenderness  Throat:   Lips, mucosa, and tongue normal; teeth and gums normal  Neck:   Supple, symmetrical, trachea midline, no adenopathy;    thyroid:  no enlargement/tenderness/nodules; right carotid bruit  Back:     Symmetric, no curvature, ROM normal, no CVA tenderness  Lungs:     Clear to auscultation bilaterally, respirations unlabored  Chest Wall:    No tenderness or deformity   Heart:    Normal heart rate. Normal rhythm. No murmurs, rubs, or gallops.   Breast Exam:    normal appearance, no masses or tenderness  Abdomen:     Soft, non-tender, bowel sounds active all four quadrants,    no masses, no  organomegaly  Pelvic:    deferred and not indicated; post-menopausal, no abnormal Pap smears in past  Extremities:   All extremities are intact. No cyanosis or edema  Pulses:   2+ and symmetric all extremities  Skin:   Skin color, texture, turgor normal, no rashes or lesions  Lymph nodes:  Cervical, supraclavicular, and axillary nodes normal  Neurologic:   CNII-XII intact, normal strength, sensation and reflexes    throughout    Activities of Daily Living In your present state of health, do you have any difficulty performing the following activities: 08/29/2018  Hearing? N  Vision? N  Difficulty concentrating or making decisions? N  Walking or climbing stairs? N  Dressing or bathing? N  Doing errands, shopping? N  Preparing Food and eating ? N  Using the Toilet? N  In the past six months, have you accidently leaked urine? N  Do you have problems with loss of bowel control? N  Managing your Medications? N  Managing your Finances? N  Housekeeping or managing your Housekeeping? N  Some recent data might be hidden    Fall Risk Assessment Fall Risk  08/29/2018 01/06/2018 08/25/2017 08/21/2016 08/05/2015  Falls in the past year? 0 0 No No No     Depression Screen PHQ 2/9 Scores 08/29/2018 01/06/2018 08/25/2017 08/25/2017  PHQ - 2 Score 0 0 0 0  PHQ- 9 Score - - 0 -    6CIT Screen 08/29/2018  What Year? 0 points  What month? 0 points  What time? 0 points  Count back from 20 0 points  Months in reverse 0 points  Repeat phrase 0 points  Total Score 0       Assessment & Plan:    Annual Physical Reviewed patient's Family Medical History Reviewed and updated list of patient's medical providers Assessment of cognitive impairment was done Assessed patient's functional ability Established a written schedule for health screening [place] Completed and Reviewed  Exercise Activities and Dietary recommendations Goals    . Increase water intake     Recommend  increasing water intake to 4-6 glasses a day.     . Quit Smoking       Immunization History  Administered Date(s) Administered  . Influenza, High Dose Seasonal PF 11/23/2016, 12/28/2017  . Influenza,inj,Quad PF,6+ Mos 01/30/2015  . Pneumococcal Conjugate-13 01/30/2015  . Pneumococcal Polysaccharide-23 08/21/2016  . Tdap 09/17/2010    Health Maintenance  Topic Date Due  . INFLUENZA VACCINE  08/27/2018  . DEXA SCAN  08/29/2018  . COLONOSCOPY  07/10/2019  . MAMMOGRAM  10/26/2019  . TETANUS/TDAP  09/16/2020  . Hepatitis C Screening  Completed  . PNA vac Low Risk Adult  Completed     Discussed health benefits of physical activity, and encouraged her to engage in regular exercise appropriate for her age and condition.    ------------------------------------------------------------------------------------------------------------ 1. Annual physical exam Flu vaccine today  2. Asymmetric blood pressures Although asymptomatic she is at high risk for vascular disease and will refer for vascular to follow - Ambulatory referral to Vascular Surgery  3. Aortic atherosclerosis ([place]) She is tolerating rosuvastatin well with no adverse effects.   - EKG 12-Lead - Ambulatory referral to Vascular Surgery  4. Carotid atherosclerosis, bilateral US 08/2017 with no  - Ambulatory referral to Vascular Surgery  5. Bruit of right carotid artery   6. Osteopenia, unspecified location  7. Estrogen deficiency  - DG Bone Density; Future  8. Essential (primary) hypertension Fairly well controlled, but 30 points different right arm compared to left.  - EKG 12-Lead  9. Hyperglycemia  - EKG 12-Lead - Hemoglobin A1c  10. Hypercholesterolemia without hypertriglyceridemia She is tolerating rosuvastatin well with no adverse effects.   - Comprehensive metabolic panel - Lipid panel - CBC  11. Pulmonary emphysema, unspecified emphysema type ([place])  Per LDTC. Asymptomatic.   Encouraged smoking  cessation, was previously prescribed bupropion which elected to not take and is not interested in trying at this time. Continue annual LDCT screening.   12. Smoking greater than 30 pack years  - Ambulatory referral to Vascular Surgery  The entirety of the information documented in the History of Present Illness, Review of Systems and Physical Exam were personally obtained by me. Portions of this information were initially documented by Meyer Cory, CMA and reviewed by me for thoroughness and accuracy.    Lelon Huh, MD  [facility]

## 2018-11-05 LAB — LIPID PANEL
Chol/HDL Ratio: 2.6 ratio (ref 0.0–4.4)
Cholesterol, Total: 158 mg/dL (ref 100–199)
HDL: 61 mg/dL (ref >39)
LDL Chol Calc (NIH): 64 mg/dL (ref 0–99)
Triglycerides: 206 mg/dL — ABNORMAL HIGH (ref 0–149)
VLDL Cholesterol Cal: 33 mg/dL (ref 5–40)

## 2018-11-05 LAB — COMPREHENSIVE METABOLIC PANEL
ALT: 14 IU/L (ref 0–32)
AST: 17 IU/L (ref 0–40)
Albumin/Globulin Ratio: 2.1 (ref 1.2–2.2)
Albumin: 4.4 g/dL (ref 3.8–4.8)
Alkaline Phosphatase: 74 IU/L (ref 39–117)
BUN/Creatinine Ratio: 15 (ref 12–28)
BUN: 13 mg/dL (ref 8–27)
Bilirubin Total: 0.3 mg/dL (ref 0.0–1.2)
CO2: 22 mmol/L (ref 20–29)
Calcium: 9.8 mg/dL (ref 8.7–10.3)
Chloride: 105 mmol/L (ref 96–106)
Creatinine, Ser: 0.85 mg/dL (ref 0.57–1.00)
GFR calc Af Amer: 81 mL/min/{1.73_m2} (ref >59)
GFR calc non Af Amer: 70 mL/min/{1.73_m2} (ref >59)
Globulin, Total: 2.1 g/dL (ref 1.5–4.5)
Glucose: 125 mg/dL — ABNORMAL HIGH (ref 65–99)
Potassium: 3.8 mmol/L (ref 3.5–5.2)
Sodium: 143 mmol/L (ref 134–144)
Total Protein: 6.5 g/dL (ref 6.0–8.5)

## 2018-11-05 LAB — CBC
Hematocrit: 45.8 % (ref 34.0–46.6)
Hemoglobin: 15.1 g/dL (ref 11.1–15.9)
MCH: 31.5 pg (ref 26.6–33.0)
MCHC: 33 g/dL (ref 31.5–35.7)
MCV: 95 fL (ref 79–97)
Platelets: 233 10*3/uL (ref 150–450)
RBC: 4.8 x10E6/uL (ref 3.77–5.28)
RDW: 13.1 % (ref 11.7–15.4)
WBC: 9.3 10*3/uL (ref 3.4–10.8)

## 2018-11-05 LAB — HEMOGLOBIN A1C
Est. average glucose Bld gHb Est-mCnc: 128 mg/dL
Hgb A1c MFr Bld: 6.1 % — ABNORMAL HIGH (ref 4.8–5.6)

## 2018-11-15 ENCOUNTER — Other Ambulatory Visit: Payer: Self-pay | Admitting: Family Medicine

## 2018-11-15 DIAGNOSIS — Z1231 Encounter for screening mammogram for malignant neoplasm of breast: Secondary | ICD-10-CM

## 2018-11-21 ENCOUNTER — Ambulatory Visit (INDEPENDENT_AMBULATORY_CARE_PROVIDER_SITE_OTHER): Payer: Medicare Other | Admitting: Vascular Surgery

## 2018-11-21 ENCOUNTER — Other Ambulatory Visit: Payer: Self-pay

## 2018-11-21 ENCOUNTER — Encounter (INDEPENDENT_AMBULATORY_CARE_PROVIDER_SITE_OTHER): Payer: Self-pay | Admitting: Vascular Surgery

## 2018-11-21 VITALS — BP 111/75 | HR 79 | Resp 16 | Wt 170.0 lb

## 2018-11-21 DIAGNOSIS — I6523 Occlusion and stenosis of bilateral carotid arteries: Secondary | ICD-10-CM

## 2018-11-21 DIAGNOSIS — I700 Atherosclerosis of aorta: Secondary | ICD-10-CM

## 2018-11-21 DIAGNOSIS — I10 Essential (primary) hypertension: Secondary | ICD-10-CM

## 2018-11-21 DIAGNOSIS — J439 Emphysema, unspecified: Secondary | ICD-10-CM

## 2018-11-21 DIAGNOSIS — E7800 Pure hypercholesterolemia, unspecified: Secondary | ICD-10-CM

## 2018-11-21 NOTE — Progress Notes (Signed)
MRN : [id number]  Angela Winters is a 68 y.o. (1951-01-18) female who presents with chief complaint of  Chief Complaint  Patient presents with  . New Patient (Initial Visit)    ref Lenon Curt for multiple vascular issues  .  History of Present Illness:    The patient is seen for evaluation of diminished pulses in the left arm. Patient denies pain in the left arm.  She denies lower extremity claudication system.  The patient denies rest pain or dangling of an extremity off the side of the bed during the night for relief. No open wounds or sores at this time. No prior interventions or surgeries.  No history of back problems or DJD of the lumbar sacral spine but she does suffer from DJD ans is s/p left TKR.   The patient denies rest pain symptoms.  No new ulcers or wounds of the left hand or of the foot.  The patient's blood pressure has been stable and relatively well controlled. The patient denies amaurosis fugax or recent TIA symptoms. There are no recent neurological changes noted. The patient denies history of DVT, PE or superficial thrombophlebitis. The patient denies recent episodes of angina or shortness of breath.   Current Meds  Medication Sig  . aspirin 81 MG tablet Take 81 mg by mouth daily.   . cholecalciferol (VITAMIN D) 1000 UNITS tablet VITAMIN D, 1000UNIT (Oral Tablet)  1 Every Day for 0 days  Quantity: 0.00;  Refills: 0   Ordered :09-Jan-2010  Darlin Priestly ;  Started 11-Sep-2008 Active  . citalopram (CELEXA) 40 MG tablet Take 1 tablet (40 mg total) by mouth daily.  Marland Kitchen lisinopril (ZESTRIL) 10 MG tablet TAKE 1 TABLET BY MOUTH EVERY DAY  . meloxicam (MOBIC) 15 MG tablet TAKE 1 TABLET (15 MG TOTAL) BY MOUTH DAILY. AS NEEDED  . metoprolol succinate (TOPROL-XL) 50 MG 24 hr tablet TAKE 1 TABLET (50 MG TOTAL) BY MOUTH DAILY. TAKE WITH OR IMMEDIATELY FOLLOWING A MEAL.  . MULTIPLE VITAMIN PO 1 tablet daily.  . rosuvastatin (CRESTOR) 40 MG tablet TAKE 1 TABLET BY MOUTH EVERY  DAY    Past Medical History:  Diagnosis Date  . Chronic radicular low back pain   . Hypertension   . Sciatica of left side   . Spinal stenosis at L4-L5 level     Past Surgical History:  Procedure Laterality Date  . REPLACEMENT TOTAL KNEE Left 07/24/2013   Dr. Marry Guan, [facility]    Social History Social History   Tobacco Use  . Smoking status: Current Every Day Smoker    Packs/day: 0.75    Years: 50.00    Pack years: 37.50    Types: Cigarettes  . Smokeless tobacco: Never Used  Substance Use Topics  . Alcohol use: Yes    Alcohol/week: 2.0 - 3.0 standard drinks    Types: 2 - 3 Shots of liquor per week  . Drug use: No    Family History Family History  Problem Relation Age of Onset  . Cancer Mother        Colon Cancer  . Breast cancer Neg Hx   No family history of bleeding/clotting disorders, porphyria or autoimmune disease   Allergies  Allergen Reactions  . Codeine Nausea Only     REVIEW OF SYSTEMS (Negative unless checked)  Constitutional: [] Weight loss  [] Fever  [] Chills Cardiac: [] Chest pain   [] Chest pressure   [] Palpitations   [] Shortness of breath when laying flat   [] Shortness of breath with  exertion. Vascular:  [] Pain in legs with walking   [] Pain in legs at rest  [] History of DVT   [] Phlebitis   [] Swelling in legs   [] Varicose veins   [] Non-healing ulcers Pulmonary:   [] Uses home oxygen   [] Productive cough   [] Hemoptysis   [] Wheeze  [x] COPD   [] Asthma Neurologic:  [] Dizziness   [] Seizures   [] History of stroke   [] History of TIA  [] Aphasia   [] Vissual changes   [] Weakness or numbness in arm   [] Weakness or numbness in leg Musculoskeletal:   [] Joint swelling   [] Joint pain   [] Low back pain Hematologic:  [] Easy bruising  [] Easy bleeding   [] Hypercoagulable state   [] Anemic Gastrointestinal:  [] Diarrhea   [] Vomiting  [] Gastroesophageal reflux/heartburn   [] Difficulty swallowing. Genitourinary:  [] Chronic kidney disease   [] Difficult urination  [] Frequent  urination   [] Blood in urine Skin:  [] Rashes   [] Ulcers  Psychological:  [] History of anxiety   []  History of major depression.  Physical Examination  Vitals:   11/21/18 1422 11/21/18 1424  BP: (!) 162/77 111/75  Pulse: 79   Resp: 16   Weight: 170 lb (77.1 kg)    Body mass index is 27.03 kg/m. Gen: WD/WN, NAD Head: [place]/AT, No temporalis wasting.  Ear/Nose/Throat: Hearing grossly intact, nares w/o erythema or drainage, poor dentition Eyes: PER, EOMI, sclera nonicteric.  Neck: Supple, no masses.  No bruit or JVD.  Pulmonary:  Good air movement, clear to auscultation bilaterally, no use of accessory muscles.  Cardiac: RRR, normal S1, S2, no Murmurs. Vascular: bilateral carotid bruits and bilateral subclavian bruits Vessel Right Left  Radial Palpable Not Palpable  Ulnar Palpable Not Palpable  Brachial Palpable Trace Palpable  Carotid Palpable Palpable  PT Not Palpable Not Palpable  DP Not Palpable Not Palpable  Gastrointestinal: soft, non-distended. No guarding/no peritoneal signs.  Musculoskeletal: M/S 5/5 throughout.  No deformity or atrophy.  Neurologic: CN 2-12 intact. Pain and light touch intact in extremities.  Symmetrical.  Speech is fluent. Motor exam as listed above. Psychiatric: Judgment intact, Mood & affect appropriate for pt's clinical situation. Dermatologic: No rashes or ulcers noted.  No changes consistent with cellulitis. Lymph : No Cervical lymphadenopathy, no lichenification or skin changes of chronic lymphedema.  CBC Lab Results  Component Value Date   WBC 9.3 11/04/2018   HGB 15.1 11/04/2018   HCT 45.8 11/04/2018   MCV 95 11/04/2018   PLT 233 11/04/2018    BMET    Component Value Date/Time   NA 143 11/04/2018 1100   NA 135 (L) 07/26/2013 0530   K 3.8 11/04/2018 1100   K 4.4 07/26/2013 0530   CL 105 11/04/2018 1100   CL 104 07/26/2013 0530   CO2 22 11/04/2018 1100   CO2 25 07/26/2013 0530   GLUCOSE 125 (H) 11/04/2018 1100   GLUCOSE 105 (H)  07/26/2013 0530   BUN 13 11/04/2018 1100   BUN 11 07/26/2013 0530   CREATININE 0.85 11/04/2018 1100   CREATININE 0.60 07/26/2013 0530   CALCIUM 9.8 11/04/2018 1100   CALCIUM 8.1 (L) 07/26/2013 0530   GFRNONAA 70 11/04/2018 1100   GFRNONAA >60 07/26/2013 0530   GFRAA 81 11/04/2018 1100   GFRAA >60 07/26/2013 0530   Estimated Creatinine Clearance: 66.2 mL/min (by C-G formula based on SCr of 0.85 mg/dL).  COAG Lab Results  Component Value Date   INR 0.9 07/12/2013    Radiology No results found.   Assessment/Plan 1. Carotid atherosclerosis, bilateral Recommend:  Given  the patient's asymptomatic subcritical stenosis no further invasive testing or surgery at this time.  Duplex ultrasound is over due.  Continue antiplatelet therapy as prescribed Continue management of CAD, HTN and Hyperlipidemia Healthy heart diet,  encouraged exercise at least 4 times per week Follow up in 1 months with duplex ultrasound and physical exam   - VAS US CAROTID; Future  2. Aortic atherosclerosis (HCC)  Recommend:  The patient has evidence of atherosclerosis of the lower extremities with claudication.  The patient does not voice lifestyle limiting changes at this point in time.  Noninvasive studies do not suggest clinically significant change.  No invasive studies, angiography or surgery at this time The patient should continue walking and begin a more formal exercise program.  The patient should continue antiplatelet therapy and aggressive treatment of the lipid abnormalities  No changes in the patient's medications at this time  The patient should continue wearing graduated compression socks 10-15 mmHg strength to control the mild edema.   - VAS [place] ABI WITH/WO TBI; Future  3. Essential (primary) hypertension Continue antihypertensive medications as already ordered, these medications have been reviewed and there are no changes at this time.   4. Pulmonary emphysema, unspecified  emphysema type ([place]) Continue pulmonary medications and aerosols as already ordered, these medications have been reviewed and there are no changes at this time.    5. Hypercholesterolemia without hypertriglyceridemia Continue statin as ordered and reviewed, no changes at this time     Hortencia Pilar, MD  11/21/2018 2:32 PM

## 2018-11-22 ENCOUNTER — Ambulatory Visit (INDEPENDENT_AMBULATORY_CARE_PROVIDER_SITE_OTHER): Payer: Medicare Other

## 2018-11-22 ENCOUNTER — Encounter (INDEPENDENT_AMBULATORY_CARE_PROVIDER_SITE_OTHER): Payer: Self-pay | Admitting: Nurse Practitioner

## 2018-11-22 ENCOUNTER — Ambulatory Visit (INDEPENDENT_AMBULATORY_CARE_PROVIDER_SITE_OTHER): Payer: Medicare Other | Admitting: Nurse Practitioner

## 2018-11-22 VITALS — BP 125/84 | HR 78 | Resp 16

## 2018-11-22 DIAGNOSIS — F17200 Nicotine dependence, unspecified, uncomplicated: Secondary | ICD-10-CM

## 2018-11-22 DIAGNOSIS — I10 Essential (primary) hypertension: Secondary | ICD-10-CM | POA: Diagnosis not present

## 2018-11-22 DIAGNOSIS — I6523 Occlusion and stenosis of bilateral carotid arteries: Secondary | ICD-10-CM | POA: Diagnosis not present

## 2018-11-22 DIAGNOSIS — I700 Atherosclerosis of aorta: Secondary | ICD-10-CM | POA: Diagnosis not present

## 2018-11-22 DIAGNOSIS — G458 Other transient cerebral ischemic attacks and related syndromes: Secondary | ICD-10-CM | POA: Diagnosis not present

## 2018-11-28 ENCOUNTER — Encounter (INDEPENDENT_AMBULATORY_CARE_PROVIDER_SITE_OTHER): Payer: Self-pay | Admitting: Nurse Practitioner

## 2018-11-28 NOTE — Progress Notes (Signed)
SUBJECTIVE:  Patient ID: Angela Winters, female    DOB: December 29, 1950, 68 y.o.   MRN: [id number] Chief Complaint  Patient presents with  . Follow-up    ultrasound follow up    HPI  Angela Winters is a 68 y.o. female that presented to our office for evaluation for diminished pulses on the left upper extremity.  The patient denies any pain in her left upper extremity.  The patient also has a history of known aortic atherosclerosis however she denies any lower extremity claudication or rest pain at this time.  Patient denies any open wounds or sores.  The patient does not have a history of back issues however it does have a recent left total knee replacement.  Patient denies any numbness or tingling of the left upper extremity.  She denies any temperature changes or color changes.  She denies any ulcerations or wounds.  Patient denies any issues with her blood pressure.  She denies any history of DVT, PE or superficial thrombosis.  She denies any chest pain or shortness of breath recently.  The patient underwent bilateral ABIs which revealed an ABI 1.14 the right and 1.19 on the left with normal TBI's greater than 0.80 bilaterally.  The patient had triphasic tibial artery waveforms.  The right digit had slightly dampened toe waveforms however the left lower extremity had severely dampened toe waveforms.  It was also noted that the blood pressure on the right upper extremity was 171 and the left was 123456 systolic.  The patient also underwent a carotid artery duplex which revealed that the right internal carotid artery has a 1 to 39% stenosis, the left has a 40 to 59% stenosis.  The external carotid artery is greater than 50% stenosed bilaterally.  There is a partial left subclavian steal noted.  The right vertebral artery demonstrates antegrade flow while the left vertebral artery demonstrates bidirectional flow.  Past Medical History:  Diagnosis Date  . Chronic radicular low back pain   .  Hypertension   . Sciatica of left side   . Spinal stenosis at L4-L5 level     Past Surgical History:  Procedure Laterality Date  . REPLACEMENT TOTAL KNEE Left 07/24/2013   Dr. Marry Guan, [place] History   Socioeconomic History  . Marital status: Married    Spouse name: Not on file  . Number of children: 1  . Years of education: Not on file  . Highest education level: 12th grade  Occupational History  . Occupation: retired  Scientific laboratory technician  . Financial resource strain: Not hard at all  . Food insecurity    Worry: Never true    Inability: Never true  . Transportation needs    Medical: No    Non-medical: No  Tobacco Use  . Smoking status: Current Every Day Smoker    Packs/day: 0.75    Years: 50.00    Pack years: 37.50    Types: Cigarettes  . Smokeless tobacco: Never Used  Substance and Sexual Activity  . Alcohol use: Yes    Alcohol/week: 2.0 - 3.0 standard drinks    Types: 2 - 3 Shots of liquor per week  . Drug use: No  . Sexual activity: Not on file  Lifestyle  . Physical activity    Days per week: 0 days    Minutes per session: 0 min  . Stress: Not at all  Relationships  . Social connections    Talks on phone: Patient refused  Gets together: Patient refused    Attends religious service: Patient refused    Active member of club or organization: Patient refused    Attends meetings of clubs or organizations: Patient refused    Relationship status: Patient refused  . Intimate partner violence    Fear of current or ex partner: Patient refused    Emotionally abused: Patient refused    Physically abused: Patient refused    Forced sexual activity: Patient refused  Other Topics Concern  . Not on file  Social History Narrative  . Not on file    Family History  Problem Relation Age of Onset  . Cancer Mother        Colon Cancer  . Breast cancer Neg Hx     Allergies  Allergen Reactions  . Codeine Nausea Only     Review of Systems   Review of  Systems: Negative Unless Checked Constitutional: [] Weight loss  [] Fever  [] Chills Cardiac: [] Chest pain   []  Atrial Fibrillation  [] Palpitations   [] Shortness of breath when laying flat   [] Shortness of breath with exertion. [] Shortness of breath at rest Vascular:  [] Pain in legs with walking   [] Pain in legs with standing [] Pain in legs when laying flat   [] Claudication    [] Pain in feet when laying flat    [] History of DVT   [] Phlebitis   [] Swelling in legs   [] Varicose veins   [] Non-healing ulcers Pulmonary:   [] Uses home oxygen   [] Productive cough   [] Hemoptysis   [] Wheeze  [x] COPD   [] Asthma Neurologic:  [] Dizziness   [] Seizures  [] Blackouts [] History of stroke   [] History of TIA  [] Aphasia   [] Temporary Blindness   [] Weakness or numbness in arm   [] Weakness or numbness in leg Musculoskeletal:   [] Joint swelling   [x] Joint pain   [] Low back pain  [x]  History of Knee Replacement [x] Arthritis [] back Surgeries  []  Spinal Stenosis    Hematologic:  [] Easy bruising  [] Easy bleeding   [] Hypercoagulable state   [] Anemic Gastrointestinal:  [] Diarrhea   [] Vomiting  [] Gastroesophageal reflux/heartburn   [] Difficulty swallowing. [] Abdominal pain Genitourinary:  [] Chronic kidney disease   [] Difficult urination  [] Anuric   [] Blood in urine [] Frequent urination  [] Burning with urination   [] Hematuria Skin:  [] Rashes   [] Ulcers [] Wounds Psychological:  [] History of anxiety   []  History of major depression  []  Memory Difficulties      OBJECTIVE:   Physical Exam  BP 125/84 (BP Location: Left Arm)   Pulse 78   Resp 16   Gen: WD/WN, NAD Head: [place]/AT, No temporalis wasting.  Ear/Nose/Throat: Hearing grossly intact, nares w/o erythema or drainage Eyes: PER, EOMI, sclera nonicteric.  Neck: Supple, no masses.  No JVD.  Pulmonary:  Good air movement, no use of accessory muscles.  Cardiac: RRR Vascular:  Bilateral carotid bruits Vessel Right Left  Radial Palpable Not Palpable  Brachial Palpable Trace  Palpable  Dorsalis Pedis Not Palpable Not Palpable  Posterior Tibial Not Palpable Not Palpable   Gastrointestinal: soft, non-distended. No guarding/no peritoneal signs.  Musculoskeletal: M/S 5/5 throughout.  No deformity or atrophy.  Neurologic: Pain and light touch intact in extremities.  Symmetrical.  Speech is fluent. Motor exam as listed above. Psychiatric: Judgment intact, Mood & affect appropriate for pt's clinical situation. Dermatologic: No Venous rashes. No Ulcers Noted.  No changes consistent with cellulitis. Lymph : No Cervical lymphadenopathy, no lichenification or skin changes of chronic lymphedema.       ASSESSMENT AND  PLAN:  1. Aortic atherosclerosis (HCC)  Recommend:  The patient has evidence of atherosclerosis of the lower extremities with no claudication.  The patient does not voice lifestyle limiting changes at this point in time.  Noninvasive studies do not suggest clinically significant change.  No invasive studies, angiography or surgery at this time The patient should continue walking and begin a more formal exercise program.  The patient should continue antiplatelet therapy and aggressive treatment of the lipid abnormalities  No changes in the patient's medications at this time  The patient should continue wearing graduated compression socks 10-15 mmHg strength to control the mild edema.   At this time the patient will plan to follow-up in 1 year.  I discussed with the patient that should she notice any color changes to her lower extremity, new wounds or ulcerations or new or worsening claudication pain she should contact her office and we will see her sooner than her 1 year follow-up.  - VAS [place] ABI WITH/WO TBI; Future  2. Carotid atherosclerosis, bilateral Recommend:  Given the patient's asymptomatic subcritical stenosis no further invasive testing or surgery at this time.  The patient also underwent a carotid artery duplex which revealed that the  right internal carotid artery has a 1 to 39% stenosis, the left has a 40 to 59% stenosis.  The external carotid artery is greater than 50% stenosed bilaterally.  Continue antiplatelet therapy as prescribed Continue management of CAD, HTN and Hyperlipidemia Healthy heart diet,  encouraged exercise at least 4 times per week Follow up in 12 months with duplex ultrasound and physical exam  - VAS US CAROTID; Future  3. Subclavian steal syndrome Currently the patient denies any issues with her upper extremities.  She denies any numbness or tingling she denies any pain.  She denies any dizziness or syncopal episodes.  Due to the fact that the patient does not display any symptoms, we will continue to monitor her subclavian steal syndrome.  I have discussed with the patient that if she should notice her hand or arm has increasing numbness, pain, tingling, discoloration, ulceration or cool feeling.  She should contact our office for sooner follow-up.  Patient understands and agrees.  We will see the patient in office in 1 year.  4. Essential (primary) hypertension Continue antihypertensive medications as already ordered, these medications have been reviewed and there are no changes at this time.   5. Tobacco use disorder Smoking cessation was discussed, 3-10 minutes spent on this topic specifically    Current Outpatient Medications on File Prior to Visit  Medication Sig Dispense Refill  . aspirin 81 MG tablet Take 81 mg by mouth daily.     . cholecalciferol (VITAMIN D) 1000 UNITS tablet VITAMIN D, 1000UNIT (Oral Tablet)  1 Every Day for 0 days  Quantity: 0.00;  Refills: 0   Ordered :09-Jan-2010  Darlin Priestly ;  Started 11-Sep-2008 Active    . citalopram (CELEXA) 40 MG tablet Take 1 tablet (40 mg total) by mouth daily. 90 tablet 4  . lisinopril (ZESTRIL) 10 MG tablet TAKE 1 TABLET BY MOUTH EVERY DAY 90 tablet 4  . meloxicam (MOBIC) 15 MG tablet TAKE 1 TABLET (15 MG TOTAL) BY MOUTH DAILY. AS  NEEDED 90 tablet 2  . metoprolol succinate (TOPROL-XL) 50 MG 24 hr tablet TAKE 1 TABLET (50 MG TOTAL) BY MOUTH DAILY. TAKE WITH OR IMMEDIATELY FOLLOWING A MEAL. 90 tablet 4  . MULTIPLE VITAMIN PO 1 tablet daily.    . rosuvastatin (CRESTOR)  40 MG tablet TAKE 1 TABLET BY MOUTH EVERY DAY 90 tablet 4   No current facility-administered medications on file prior to visit.     There are no Patient Instructions on file for this visit. No follow-ups on file.   Kris Hartmann, NP  This note was completed with Sales executive.  Any errors are purely unintentional.

## 2018-12-08 ENCOUNTER — Ambulatory Visit
Admission: RE | Admit: 2018-12-08 | Discharge: 2018-12-08 | Disposition: A | Discharge status: Home / Self Care | Payer: Medicare Other | Source: Ambulatory Visit | Attending: Family Medicine | Admitting: Family Medicine

## 2018-12-08 ENCOUNTER — Telehealth: Payer: Self-pay

## 2018-12-08 DIAGNOSIS — E2839 Other primary ovarian failure: Secondary | ICD-10-CM | POA: Diagnosis not present

## 2018-12-08 NOTE — Telephone Encounter (Signed)
-----   Message from Birdie Sons, MD sent at 12/08/2018 11:17 AM EST ----- Bone density is worsening, is borderline for osteoporosis. Not bad enough for prescription medications, but should take OTC vitamin d3 1000 units every day. Repeat BMD in 2 years.

## 2018-12-08 NOTE — Telephone Encounter (Signed)
Patient has been advised. KW 

## 2018-12-09 ENCOUNTER — Other Ambulatory Visit (HOSPITAL_COMMUNITY): Payer: Self-pay | Admitting: Physician Assistant

## 2018-12-09 ENCOUNTER — Other Ambulatory Visit: Payer: Self-pay | Admitting: Physician Assistant

## 2018-12-09 DIAGNOSIS — H93A9 Pulsatile tinnitus, unspecified ear: Secondary | ICD-10-CM

## 2018-12-16 ENCOUNTER — Ambulatory Visit: Payer: Medicare Other

## 2018-12-16 ENCOUNTER — Ambulatory Visit
Admission: RE | Admit: 2018-12-16 | Discharge: 2018-12-16 | Disposition: A | Discharge status: Home / Self Care | Payer: Medicare Other | Source: Ambulatory Visit | Attending: Physician Assistant | Admitting: Physician Assistant

## 2018-12-16 ENCOUNTER — Other Ambulatory Visit: Payer: Self-pay

## 2018-12-16 DIAGNOSIS — H93A9 Pulsatile tinnitus, unspecified ear: Secondary | ICD-10-CM

## 2018-12-16 MED ORDER — IOHEXOL 350 MG/ML SOLN
75.0000 mL | Freq: Once | INTRAVENOUS | Status: AC | PRN
  Administered 2018-12-16: 75 mL via INTRAVENOUS

## 2019-01-31 ENCOUNTER — Ambulatory Visit
Admission: RE | Admit: 2019-01-31 | Discharge: 2019-01-31 | Disposition: A | Discharge status: Home / Self Care | Payer: Medicare PPO | Source: Ambulatory Visit | Attending: Family Medicine | Admitting: Family Medicine

## 2019-01-31 ENCOUNTER — Other Ambulatory Visit: Payer: Self-pay

## 2019-01-31 DIAGNOSIS — Z1231 Encounter for screening mammogram for malignant neoplasm of breast: Secondary | ICD-10-CM | POA: Insufficient documentation

## 2019-03-27 ENCOUNTER — Other Ambulatory Visit: Payer: Self-pay | Admitting: Family Medicine

## 2019-03-27 NOTE — Telephone Encounter (Signed)
Requested medication (s) are due for refill today: yes  Requested medication (s) are on the active medication list: yes  Last refill: 12/28/2018  Future visit scheduled: no  Notes to clinic:  no valid encounter within last 6 months    Requested Prescriptions  Pending Prescriptions Disp Refills   citalopram (CELEXA) 40 MG tablet [Pharmacy Med Name: CITALOPRAM HBR 40 MG TABLET] 90 tablet 4    Sig: TAKE 1 TABLET BY MOUTH EVERY DAY      Psychiatry:  Antidepressants - SSRI Failed - 03/27/2019  1:23 AM      Failed - Valid encounter within last 6 months    Recent Outpatient Visits           4 months ago Annual physical exam   [facility] Birdie Sons, MD   1 year ago Hypercholesterolemia without hypertriglyceridemia   [facility] Birdie Sons, MD   1 year ago Annual physical exam   [facility] Birdie Sons, MD   2 years ago Annual physical exam   [facility] Birdie Sons, MD   3 years ago Annual physical exam   [facility] Birdie Sons, MD

## 2019-05-04 ENCOUNTER — Other Ambulatory Visit: Payer: Self-pay | Admitting: Family Medicine

## 2019-05-04 MED ORDER — METOPROLOL SUCCINATE ER 50 MG PO TB24: 50.0000 mg | ORAL_TABLET | Freq: Every day | ORAL | 5 refills | Status: DC

## 2019-05-04 NOTE — Telephone Encounter (Signed)
Requested  medications are  due for refill today yes  Requested medications are on the active medication list yes  Last refill 1/7  Future visit scheduled no  Notes to clinic -failed protocol last visit more than 6 months ago.

## 2019-05-04 NOTE — Addendum Note (Signed)
Addended by: Birdie Sons on: 05/04/2019 10:26 AM   Modules accepted: Orders

## 2019-05-29 NOTE — Progress Notes (Signed)
Established patient visit   Patient: Angela Winters   DOB: September 21, 1950   69 y.o. Female  MRN: [id number] Visit Date: 05/30/2019  Today's healthcare provider: Lelon Huh, MD   Chief Complaint  Patient presents with  . Hypertension  . Hyperlipidemia  . Hyperglycemia   I,Latasha Walston,acting as a scribe for Lelon Huh, MD.,have documented all relevant documentation on the behalf of Lelon Huh, MD,as directed by  Lelon Huh, MD while in the presence of Lelon Huh, MD.  Subjective    HPI Hypertension, follow-up  BP Readings from Last 3 Encounters:  05/30/19 111/68  11/22/18 125/84  11/21/18 111/75   Wt Readings from Last 3 Encounters:  05/30/19 176 lb 9.6 oz (80.1 kg)  11/21/18 170 lb (77.1 kg)  11/04/18 168 lb (76.2 kg)     She was last seen for hypertension 7 months ago.  BP at that visit was 130/68. Management since that visit includes no change.  She reports good compliance with treatment. She is not having side effects.  She is following a Regular diet. She is not exercising. She does smoke.  Use of agents associated with hypertension: none.   Outside blood pressures are not being checked at home. Symptoms: No chest pain No chest pressure No palpitations No dyspnea No orthopnea No paroxysmal nocturnal dyspnea No lower extremity edema No syncope   Pertinent labs: Lab Results  Component Value Date   CHOL 158 11/04/2018   HDL 61 11/04/2018   LDLCALC 64 11/04/2018   TRIG 206 (H) 11/04/2018   CHOLHDL 2.6 11/04/2018   Lab Results  Component Value Date   NA 143 11/04/2018   K 3.8 11/04/2018   CO2 22 11/04/2018   GLUCOSE 125 (H) 11/04/2018   BUN 13 11/04/2018   CREATININE 0.85 11/04/2018   CALCIUM 9.8 11/04/2018   GFRNONAA 70 11/04/2018   GFRAA 81 11/04/2018     The 10-year ASCVD risk score Mikey Bussing DC Jr., et al., 2013) is: 12.8%    --------------------------------------------------------------------------------------------------- Lipid/Cholesterol, Follow-up  Last lipid panel Other pertinent labs  Lab Results  Component Value Date   CHOL 158 11/04/2018   HDL 61 11/04/2018   LDLCALC 64 11/04/2018   TRIG 206 (H) 11/04/2018   CHOLHDL 2.6 11/04/2018   Lab Results  Component Value Date   ALT 14 11/04/2018   AST 17 11/04/2018   PLT 233 11/04/2018   TSH 0.718 08/21/2016     She was last seen for this 7 months ago.  Management since that visit includes no change.  She reports good compliance with treatment. She is not having side effects.  Symptoms: No chest pain No chest pressure/discomfort No dyspnea No lower extremity edema No numbness or tingling of extremity No orthopnea No palpitations No paroxysmal nocturnal dyspnea No speech difficulty No syncope  Current diet: in general, a "healthy" diet   Current exercise: none  Wt Readings from Last 3 Encounters:  05/30/19 176 lb 9.6 oz (80.1 kg)  11/21/18 170 lb (77.1 kg)  11/04/18 168 lb (76.2 kg)   The 10-year ASCVD risk score Mikey Bussing DC Jr., et al., 2013) is: 12.8%  ----------------------------------------------------------------------------------------- Follow up for Hyperglycemia:  The patient was last seen for this 7 months ago. Changes made at last visit include no change.  She reports good compliance with treatment.  She is not having side effects.   Lab Results  Component Value Date   HGBA1C 6.2 (A) 05/30/2019    -----------------------------------------------------------------------------------------     Medications: Outpatient  Medications Prior to Visit  Medication Sig  . aspirin 81 MG tablet Take 81 mg by mouth daily.   . cholecalciferol (VITAMIN D) 1000 UNITS tablet VITAMIN D, 1000UNIT (Oral Tablet)  1 Every Day for 0 days  Quantity: 0.00;  Refills: 0   Ordered :09-Jan-2010  Darlin Priestly ;  Started 11-Sep-2008 Active   . citalopram (CELEXA) 40 MG tablet TAKE 1 TABLET BY MOUTH EVERY DAY  . lisinopril (ZESTRIL) 10 MG tablet TAKE 1 TABLET BY MOUTH EVERY DAY  . meloxicam (MOBIC) 15 MG tablet TAKE 1 TABLET (15 MG TOTAL) BY MOUTH DAILY. AS NEEDED  . metoprolol succinate (TOPROL-XL) 50 MG 24 hr tablet Take 1 tablet (50 mg total) by mouth daily. Take with or immediately following a meal.  . MULTIPLE VITAMIN PO 1 tablet daily.  . rosuvastatin (CRESTOR) 40 MG tablet TAKE 1 TABLET BY MOUTH EVERY DAY   No facility-administered medications prior to visit.    Review of Systems  Constitutional: Negative.   Respiratory: Negative.   Cardiovascular: Negative.   Musculoskeletal: Negative.       Objective    BP 111/68 (BP Location: Right Arm, Patient Position: Sitting, Cuff Size: Normal)   Pulse 92   Temp (!) 97.3 F (36.3 C) (Temporal)   Wt 176 lb 9.6 oz (80.1 kg)   BMI 28.08 kg/m    Physical Exam   General: Appearance:     Overweight female in no acute distress  Eyes:    PERRL, conjunctiva/corneas clear, EOM's intact       Lungs:     Clear to auscultation bilaterally, respirations unlabored  Heart:    Normal heart rate. Normal rhythm. No murmurs, rubs, or gallops.   MS:   All extremities are intact.   Neurologic:   Awake, alert, oriented x 3. No apparent focal neurological           defect.        Results for orders placed or performed in visit on 05/30/19  POCT HgB A1C  Result Value Ref Range   Hemoglobin A1C 6.2 (A) 4.0 - 5.6 %   Est. average glucose Bld gHb Est-mCnc 131     Assessment & Plan     1. Aortic atherosclerosis (HCC) Asymptomatic. Compliant with medication.  Continue aggressive risk factor modification.    2. Essential (primary) hypertension Well controlled.  Continue current medications.   - lisinopril (ZESTRIL) 10 MG tablet; Take 1 tablet (10 mg total) by mouth daily.  Dispense: 90 tablet; Refill: 4  3. Hypercholesterolemia without hypertriglyceridemia She is tolerating  rosuvastatin well with no adverse effects.  refill - rosuvastatin (CRESTOR) 40 MG tablet; Take 1 tablet (40 mg total) by mouth daily.  Dispense: 90 tablet; Refill: 4  4. Prediabetes A1c slowly creeping up. Counseled on low carbohydrate diet.   5. Pulmonary emphysema, unspecified emphysema type ([place]) Strongly encouraged smoking cessation.   6. Smoking greater than 30 pack years Is getting annual LDCT. She declined prescription medications for smoking cessation today.   Other orders - metoprolol succinate (TOPROL-XL) 50 MG 24 hr tablet; Take 1 tablet (50 mg total) by mouth daily. Take with or immediately following a meal.  Dispense: 90 tablet; Refill: 4   No follow-ups on file.      The entirety of the information documented in the History of Present Illness, Review of Systems and Physical Exam were personally obtained by me. Portions of this information were initially documented by the [facility] and reviewed by me for  thoroughness and accuracy.      Lelon Huh, MD  [facility] [phone] (phone) [phone] (fax)  [place]

## 2019-05-30 ENCOUNTER — Ambulatory Visit (INDEPENDENT_AMBULATORY_CARE_PROVIDER_SITE_OTHER): Payer: Medicare PPO | Admitting: Family Medicine

## 2019-05-30 ENCOUNTER — Encounter: Payer: Self-pay | Admitting: Family Medicine

## 2019-05-30 ENCOUNTER — Other Ambulatory Visit: Payer: Self-pay

## 2019-05-30 VITALS — BP 111/68 | HR 92 | Temp 97.3°F | Wt 176.6 lb

## 2019-05-30 DIAGNOSIS — J439 Emphysema, unspecified: Secondary | ICD-10-CM | POA: Diagnosis not present

## 2019-05-30 DIAGNOSIS — E7800 Pure hypercholesterolemia, unspecified: Secondary | ICD-10-CM

## 2019-05-30 DIAGNOSIS — F17210 Nicotine dependence, cigarettes, uncomplicated: Secondary | ICD-10-CM

## 2019-05-30 DIAGNOSIS — I700 Atherosclerosis of aorta: Secondary | ICD-10-CM

## 2019-05-30 DIAGNOSIS — R739 Hyperglycemia, unspecified: Secondary | ICD-10-CM | POA: Diagnosis not present

## 2019-05-30 DIAGNOSIS — I10 Essential (primary) hypertension: Secondary | ICD-10-CM

## 2019-05-30 LAB — POCT GLYCOSYLATED HEMOGLOBIN (HGB A1C)
Est. average glucose Bld gHb Est-mCnc: 131
Hemoglobin A1C: 6.2 % — AB (ref 4.0–5.6)

## 2019-05-30 MED ORDER — ROSUVASTATIN CALCIUM 40 MG PO TABS: 40.0000 mg | ORAL_TABLET | Freq: Every day | ORAL | 4 refills | Status: DC

## 2019-05-30 MED ORDER — METOPROLOL SUCCINATE ER 50 MG PO TB24: 50.0000 mg | ORAL_TABLET | Freq: Every day | ORAL | 4 refills | Status: DC

## 2019-05-30 MED ORDER — LISINOPRIL 10 MG PO TABS: 10.0000 mg | ORAL_TABLET | Freq: Every day | ORAL | 4 refills | Status: DC

## 2019-05-30 NOTE — Patient Instructions (Addendum)
.   Please review the attached list of medications and notify my office if there are any errors.   . Please bring all of your medications to every appointment so we can make sure that our medication list is the same as yours.    Please stop smoking

## 2019-08-29 ENCOUNTER — Telehealth: Payer: Self-pay

## 2019-08-29 NOTE — Telephone Encounter (Addendum)
Patient has been notified that the low dose lung cancer screening CT scan is due currently or will be in near future.  Confirmed that patient is within the appropriate age range and asymptomatic, (no signs or symptoms of lung cancer).  Patient denies illness that would prevent curative treatment for lung cancer if found.  Patient is agreeable for CT scan being scheduled.    Verified smoking history (current smoker, with 50 year 0.75 ppd history).   CT scheduled for 09/13/19 @ 11:35

## 2019-08-30 ENCOUNTER — Other Ambulatory Visit: Payer: Self-pay | Admitting: *Deleted

## 2019-08-30 DIAGNOSIS — Z122 Encounter for screening for malignant neoplasm of respiratory organs: Secondary | ICD-10-CM

## 2019-08-30 DIAGNOSIS — Z87891 Personal history of nicotine dependence: Secondary | ICD-10-CM

## 2019-09-13 ENCOUNTER — Other Ambulatory Visit: Payer: Self-pay

## 2019-09-13 ENCOUNTER — Ambulatory Visit
Admission: RE | Admit: 2019-09-13 | Discharge: 2019-09-13 | Disposition: A | Discharge status: Home / Self Care | Payer: Medicare PPO | Source: Ambulatory Visit | Attending: Oncology | Admitting: Oncology

## 2019-09-13 DIAGNOSIS — Z122 Encounter for screening for malignant neoplasm of respiratory organs: Secondary | ICD-10-CM | POA: Insufficient documentation

## 2019-09-13 DIAGNOSIS — Z87891 Personal history of nicotine dependence: Secondary | ICD-10-CM | POA: Insufficient documentation

## 2019-09-13 DIAGNOSIS — F17210 Nicotine dependence, cigarettes, uncomplicated: Secondary | ICD-10-CM | POA: Diagnosis not present

## 2019-09-18 ENCOUNTER — Encounter: Payer: Self-pay | Admitting: *Deleted

## 2019-11-16 NOTE — Progress Notes (Deleted)
Subjective:   Angela Winters is a 69 y.o. female who presents for an Initial Medicare Annual Wellness Visit.  I connected with Aariya Ferrick today by telephone and verified that I am speaking with the correct person using two identifiers. Location patient: home Location provider: work Persons participating in the virtual visit: patient, provider.   I discussed the limitations, risks, security and privacy concerns of performing an evaluation and management service by telephone and the availability of in person appointments. I also discussed with the patient that there may be a patient responsible charge related to this service. The patient expressed understanding and verbally consented to this telephonic visit.    Interactive audio and video telecommunications were attempted between this provider and patient, however failed, due to patient having technical difficulties OR patient did not have access to video capability.  We continued and completed visit with audio only.   Review of Systems    N/A        Objective:    There were no vitals filed for this visit. There is no height or weight on file to calculate BMI.  Advanced Directives 08/29/2018 08/25/2017 08/21/2016 08/05/2015  Does Patient Have a Medical Advance Directive? No No No No  Does patient want to make changes to medical advance directive? - No - Patient declined - -  Would patient like information on creating a medical advance directive? No - Patient declined - No - Patient declined -    Current Medications (verified) Outpatient Encounter Medications as of 11/20/2019  Medication Sig  . aspirin 81 MG tablet Take 81 mg by mouth daily.   . cholecalciferol (VITAMIN D) 1000 UNITS tablet VITAMIN D, 1000UNIT (Oral Tablet)  1 Every Day for 0 days  Quantity: 0.00;  Refills: 0   Ordered :09-Jan-2010  Darlin Priestly ;  Started 11-Sep-2008 Active  . citalopram (CELEXA) 40 MG tablet TAKE 1 TABLET BY MOUTH EVERY DAY  . lisinopril  (ZESTRIL) 10 MG tablet Take 1 tablet (10 mg total) by mouth daily.  . meloxicam (MOBIC) 15 MG tablet TAKE 1 TABLET (15 MG TOTAL) BY MOUTH DAILY. AS NEEDED  . metoprolol succinate (TOPROL-XL) 50 MG 24 hr tablet Take 1 tablet (50 mg total) by mouth daily. Take with or immediately following a meal.  . MULTIPLE VITAMIN PO 1 tablet daily.  . rosuvastatin (CRESTOR) 40 MG tablet Take 1 tablet (40 mg total) by mouth daily.   No facility-administered encounter medications on file as of 11/20/2019.    Allergies (verified) Codeine   History: Past Medical History:  Diagnosis Date  . Chronic radicular low back pain   . Hypertension   . Sciatica of left side   . Spinal stenosis at L4-L5 level    Past Surgical History:  Procedure Laterality Date  . REPLACEMENT TOTAL KNEE Left 07/24/2013   Dr. Marry Guan, [facility]   Family History  Problem Relation Age of Onset  . Cancer Mother        Colon Cancer  . Breast cancer Neg Hx    Social History   Socioeconomic History  . Marital status: Married    Spouse name: Not on file  . Number of children: 1  . Years of education: Not on file  . Highest education level: 12th grade  Occupational History  . Occupation: retired  Tobacco Use  . Smoking status: Current Every Day Smoker    Packs/day: 0.75    Years: 50.00    Pack years: 37.50    Types: Cigarettes  .  Smokeless tobacco: Never Used  Vaping Use  . Vaping Use: Never used  Substance and Sexual Activity  . Alcohol use: Yes    Alcohol/week: 2.0 - 3.0 standard drinks    Types: 2 - 3 Shots of liquor per week  . Drug use: No  . Sexual activity: Not on file  Other Topics Concern  . Not on file  Social History Narrative  . Not on file   Social Determinants of Health   Financial Resource Strain:   . Difficulty of Paying Living Expenses: Not on file  Food Insecurity:   . Worried About Charity fundraiser in the Last Year: Not on file  . Ran Out of Food in the Last Year: Not on file    Transportation Needs:   . Lack of Transportation (Medical): Not on file  . Lack of Transportation (Non-Medical): Not on file  Physical Activity:   . Days of Exercise per Week: Not on file  . Minutes of Exercise per Session: Not on file  Stress:   . Feeling of Stress : Not on file  Social Connections:   . Frequency of Communication with Friends and Family: Not on file  . Frequency of Social Gatherings with Friends and Family: Not on file  . Attends Religious Services: Not on file  . Active Member of Clubs or Organizations: Not on file  . Attends Archivist Meetings: Not on file  . Marital Status: Not on file    Tobacco Counseling Ready to quit: Not Answered Counseling given: Not Answered   Clinical Intake:                 Diabetic? No         Activities of Daily Living No flowsheet data found.  Patient Care Team: Birdie Sons, MD as PCP - General (Family Medicine) Anell Barr, OD as Consulting Physician (Optometry)  Indicate any recent Medical Services you may have received from other than [facility] providers in the past year (date may be approximate).     Assessment:   This is a routine wellness examination for [place].  Hearing/Vision screen No exam data present  Dietary issues and exercise activities discussed:    Goals    . Increase water intake     Recommend increasing water intake to 4-6 glasses a day.     . Quit Smoking      Depression Screen PHQ 2/9 Scores 08/29/2018 01/06/2018 08/25/2017 08/25/2017 08/21/2016 08/21/2016 08/05/2015  PHQ - 2 Score 0 0 0 0 0 0 0  PHQ- 9 Score - - 0 - 2 - -    Fall Risk Fall Risk  05/30/2019 08/29/2018 01/06/2018 08/25/2017 08/21/2016  Falls in the past year? 0 0 0 No No  Number falls in past yr: 0 - - - -  Injury with Fall? 0 - - - -    Any stairs in or around the home? {YES/NO:21197} If so, are there any without handrails? {YES/NO:21197} Home free of loose throw rugs in walkways, pet beds,  electrical cords, etc? Yes  Adequate lighting in your home to reduce risk of falls? Yes   ASSISTIVE DEVICES UTILIZED TO PREVENT FALLS:  Life alert? {YES/NO:21197} Use of a cane, walker or w/c? {YES/NO:21197} Grab bars in the bathroom? {YES/NO:21197} Shower chair or bench in shower? {YES/NO:21197} Elevated toilet seat or a handicapped toilet? {YES/NO:21197}   Cognitive Function:     6CIT Screen 08/29/2018 08/25/2017 08/21/2016  What Year? 0 points 0  points 0 points  What month? 0 points 0 points 0 points  What time? 0 points 0 points 0 points  Count back from 20 0 points 0 points 0 points  Months in reverse 0 points 0 points 0 points  Repeat phrase 0 points 2 points 0 points  Total Score 0 2 0    Immunizations Immunization History  Administered Date(s) Administered  . Fluad Quad(high Dose 65+) 11/04/2018  . Influenza, High Dose Seasonal PF 11/23/2016, 12/28/2017  . Influenza,inj,Quad PF,6+ Mos 01/30/2015  . PFIZER SARS-COV-2 Vaccination 03/07/2019, 03/28/2019  . Pneumococcal Conjugate-13 01/30/2015  . Pneumococcal Polysaccharide-23 08/21/2016  . Tdap 09/17/2010    TDAP status: Up to date {Flu Vaccine status:2101806} Pneumococcal vaccine status: Up to date Covid-19 vaccine status: Completed vaccines  Qualifies for Shingles Vaccine? Yes   Zostavax completed No   Shingrix Completed?: No.    Education has been provided regarding the importance of this vaccine. Patient has been advised to call insurance company to determine out of pocket expense if they have not yet received this vaccine. Advised may also receive vaccine at local pharmacy or Health Dept. Verbalized acceptance and understanding.  Screening Tests Health Maintenance  Topic Date Due  . COLONOSCOPY  07/10/2019  . INFLUENZA VACCINE  08/27/2019  . TETANUS/TDAP  09/16/2020  . DEXA SCAN  12/07/2020  . MAMMOGRAM  01/30/2021  . COVID-19 Vaccine  Completed  . Hepatitis C Screening  Completed  . PNA vac Low Risk  Adult  Completed    Health Maintenance  Health Maintenance Due  Topic Date Due  . COLONOSCOPY  07/10/2019  . INFLUENZA VACCINE  08/27/2019    {Colorectal cancer screening:2101809} Mammogram status: Completed 01/31/19. Repeat every year Bone Density status: Completed 12/16/18. Results reflect: Bone density results: OSTEOPENIA. Repeat every 2 years.  Lung Cancer Screening: (Low Dose CT Chest recommended if Age 22-80 years, 30 pack-year currently smoking OR have quit w/in 15years.) {DOES NOT does:27190::"does not"} qualify.   Lung Cancer Screening Referral: ***  Additional Screening:  Hepatitis C Screening: Up to date  Vision Screening: Recommended annual ophthalmology exams for early detection of glaucoma and other disorders of the eye. Is the patient up to date with their annual eye exam?  Yes  Who is the provider or what is the name of the office in which the patient attends annual eye exams? *** If pt is not established with a provider, would they like to be referred to a provider to establish care? No .   Dental Screening: Recommended annual dental exams for proper oral hygiene  Community Resource Referral / Chronic Care Management: CRR required this visit?  No   CCM required this visit?  No      Plan:     I have personally reviewed and noted the following in the patient's chart:   . Medical and social history . Use of alcohol, tobacco or illicit drugs  . Current medications and supplements . Functional ability and status . Nutritional status . Physical activity . Advanced directives . List of other physicians . Hospitalizations, surgeries, and ER visits in previous 12 months . Vitals . Screenings to include cognitive, depression, and falls . Referrals and appointments  In addition, I have reviewed and discussed with patient certain preventive protocols, quality metrics, and best practice recommendations. A written personalized care plan for preventive services  as well as general preventive health recommendations were provided to patient.     Chequita Mofield [place], [place]   [id number]/[id number]   Nurse  Notes: ***

## 2019-11-17 NOTE — Progress Notes (Signed)
Complete physical exam   Patient: Angela Winters   DOB: 1950/07/23   69 y.o. Female  MRN: [id number] Visit Date: 11/20/2019  Today's healthcare provider: Lelon Huh, MD   Chief Complaint  Patient presents with   Annual Exam   Hyperlipidemia   Hypertension   Prediabetes   Subjective    Angela Winters is a 69 y.o. female who presents today for a complete physical exam.  She reports consuming a general diet. The patient does not participate in regular exercise at present. She generally feels fairly well. She reports sleeping fairly well. She does not have additional problems to discuss today.   Had AWV with [facility] today at 8:20am.   HPI  Hypertension, follow-up  BP Readings from Last 3 Encounters:  11/20/19 108/80  05/30/19 111/68  11/22/18 125/84   Wt Readings from Last 3 Encounters:  11/20/19 179 lb (81.2 kg)  09/13/19 176 lb (79.8 kg)  05/30/19 176 lb 9.6 oz (80.1 kg)     She was last seen for hypertension 5 months ago.  BP at that visit was 111/68. Management since that visit includes continue same medication.  She reports good compliance with treatment. She is not having side effects.  She is following a Regular diet. She is not exercising. She does smoke.  Use of agents associated with hypertension: NSAIDS.   Outside blood pressures are not checked. Symptoms: No chest pain No chest pressure  No palpitations No syncope  No dyspnea No orthopnea  No paroxysmal nocturnal dyspnea No lower extremity edema   Pertinent labs: Lab Results  Component Value Date   CHOL 158 11/04/2018   HDL 61 11/04/2018   LDLCALC 64 11/04/2018   TRIG 206 (H) 11/04/2018   CHOLHDL 2.6 11/04/2018   Lab Results  Component Value Date   NA 143 11/04/2018   K 3.8 11/04/2018   CREATININE 0.85 11/04/2018   GFRNONAA 70 11/04/2018   GFRAA 81 11/04/2018   GLUCOSE 125 (H) 11/04/2018     The 10-year ASCVD risk score Mikey Bussing DC Jr., et al., 2013) is: 13.4%    ---------------------------------------------------------------------------------------------------  Lipid/Cholesterol, Follow-up  Last lipid panel Other pertinent labs  Lab Results  Component Value Date   CHOL 158 11/04/2018   HDL 61 11/04/2018   LDLCALC 64 11/04/2018   TRIG 206 (H) 11/04/2018   CHOLHDL 2.6 11/04/2018   Lab Results  Component Value Date   ALT 14 11/04/2018   AST 17 11/04/2018   PLT 233 11/04/2018   TSH 0.718 08/21/2016     She was last seen for this 5 months ago.  Management since that visit includes continue same medications.  She reports good compliance with treatment. She is not having side effects.   Symptoms: No chest pain No chest pressure/discomfort  No dyspnea No lower extremity edema  No numbness or tingling of extremity No orthopnea  No palpitations No paroxysmal nocturnal dyspnea  No speech difficulty No syncope   Current diet: well balanced Current exercise: none  The 10-year ASCVD risk score Mikey Bussing DC Jr., et al., 2013) is: 13.4%  ---------------------------------------------------------------------------------------------------  Prediabetes, Follow-up  Lab Results  Component Value Date   HGBA1C 6.2 (A) 05/30/2019   HGBA1C 6.1 (H) 11/04/2018   HGBA1C 5.9 (H) 01/06/2018   GLUCOSE 125 (H) 11/04/2018   GLUCOSE 104 (H) 01/06/2018   GLUCOSE 100 (H) 08/25/2017    Last seen for for this 5 months ago.  Management since that visit includes counseling on low carbohydrate diet.  Current symptoms include none and have been stable.  Prior visit with dietician: no Current diet: well balanced Current exercise: none  Pertinent Labs:    Component Value Date/Time   CHOL 158 11/04/2018 1100   TRIG 206 (H) 11/04/2018 1100   CHOLHDL 2.6 11/04/2018 1100   CREATININE 0.85 11/04/2018 1100   CREATININE 0.60 07/26/2013 0530    Wt Readings from Last 3 Encounters:  11/20/19 179 lb (81.2 kg)  09/13/19 176 lb (79.8 kg)  05/30/19 176 lb 9.6  oz (80.1 kg)    -----------------------------------------------------------------------------------------  Past Medical History:  Diagnosis Date   Chronic radicular low back pain    Hypertension    Sciatica of left side    Spinal stenosis at L4-L5 level    Past Surgical History:  Procedure Laterality Date   REPLACEMENT TOTAL KNEE Left 07/24/2013   Dr. Marry Guan, [facility]   Social History   Socioeconomic History   Marital status: Married    Spouse name: Not on file   Number of children: 1   Years of education: Not on file   Highest education level: 12th grade  Occupational History   Occupation: retired  Tobacco Use   Smoking status: Current Every Day Smoker    Packs/day: 0.75    Years: 50.00    Pack years: 37.50    Types: Cigarettes   Smokeless tobacco: Never Used  Scientific laboratory technician Use: Never used  Substance and Sexual Activity   Alcohol use: Yes    Alcohol/week: 2.0 - 3.0 standard drinks    Types: 2 - 3 Shots of liquor per week   Drug use: No   Sexual activity: Not on file  Other Topics Concern   Not on file  Social History Narrative   Not on file   Social Determinants of Health   Financial Resource Strain: Low Risk    Difficulty of Paying Living Expenses: Not hard at all  Food Insecurity: No Food Insecurity   Worried About Charity fundraiser in the Last Year: Never true   [place] in the Last Year: Never true  Transportation Needs: No Transportation Needs   Lack of Transportation (Medical): No   Lack of Transportation (Non-Medical): No  Physical Activity: Inactive   Days of Exercise per Week: 0 days   Minutes of Exercise per Session: 0 min  Stress: No Stress Concern Present   Feeling of Stress : Not at all  Social Connections: Moderately Isolated   Frequency of Communication with Friends and Family: More than three times a week   Frequency of Social Gatherings with Friends and Family: Twice a week   Attends Religious  Services: Never   Marine scientist or Organizations: No   Attends Music therapist: Never   Marital Status: Married  Human resources officer Violence: Not At Risk   Fear of Current or Ex-Partner: No   Emotionally Abused: No   Physically Abused: No   Sexually Abused: No   Family Status  Relation Name Status   Mother  Alive   Father  Alive   Sister  Alive   Brother  Alive   Neg Hx  (Not Specified)   Family History  Problem Relation Age of Onset   Cancer Mother        Colon Cancer   Breast cancer Neg Hx    Allergies  Allergen Reactions   Codeine Nausea Only    Patient Care Team: Birdie Sons, MD as  PCP - General (Family Medicine) Anell Barr, OD as Consulting Physician (Optometry)   Medications: Outpatient Medications Prior to Visit  Medication Sig   aspirin 81 MG tablet Take 81 mg by mouth daily.    cholecalciferol (VITAMIN D) 1000 UNITS tablet VITAMIN D, 1000UNIT (Oral Tablet)  1 Every Day for 0 days  Quantity: 0.00;  Refills: 0   Ordered :09-Jan-2010  Darlin Priestly ;  Started 11-Sep-2008 Active   citalopram (CELEXA) 40 MG tablet TAKE 1 TABLET BY MOUTH EVERY DAY   lisinopril (ZESTRIL) 10 MG tablet Take 1 tablet (10 mg total) by mouth daily.   meloxicam (MOBIC) 15 MG tablet TAKE 1 TABLET (15 MG TOTAL) BY MOUTH DAILY. AS NEEDED   metoprolol succinate (TOPROL-XL) 50 MG 24 hr tablet Take 1 tablet (50 mg total) by mouth daily. Take with or immediately following a meal.   MULTIPLE VITAMIN PO 1 tablet daily.   rosuvastatin (CRESTOR) 40 MG tablet Take 1 tablet (40 mg total) by mouth daily.   No facility-administered medications prior to visit.       Objective       Vitals:   11/20/19 0912 11/20/19 0914 11/20/19 0918 11/20/19 0919  BP: (!) 153/78 116/82 (!) 160/72 108/80  Pulse: 73     Resp: 16     Temp: 98.2 F (36.8 C)     TempSrc: Oral     Weight: 179 lb (81.2 kg)     Height: 5\' 6"  (1.676 m)       BMI Readings  from Last 1 Encounters:  11/20/19 28.89 kg/m    Physical Exam    General Appearance:     Well developed, well nourished female. Alert, cooperative, in no acute distress, appears stated age   Head:    Normocephalic, without obvious abnormality, atraumatic  Eyes:    PERRL, conjunctiva/corneas clear, EOM's intact, fundi    benign, both eyes  Ears:    Normal TM's and external ear canals, both ears  Neck:   Supple, symmetrical, trachea midline, no adenopathy;    thyroid:  no enlargement/tenderness/nodules; bilateral carotid bruits.   Back:     Symmetric, no curvature, ROM normal, no CVA tenderness  Lungs:     Clear to auscultation bilaterally, respirations unlabored  Chest Wall:    No tenderness or deformity   Heart:    Normal heart rate. Normal rhythm. II/VI systolic murmur RUSB.   Breast Exam:    deferred  Abdomen:     Soft, non-tender, bowel sounds active all four quadrants,    no masses, no organomegaly  Pelvic:    deferred  Extremities:   All extremities are intact. No cyanosis or edema  Pulses:   2+ and symmetric all extremities  Skin:   Skin color, texture, turgor normal, no rashes or lesions  Lymph nodes:   Cervical, supraclavicular, and axillary nodes normal  Neurologic:   CNII-XII intact, normal strength, sensation and reflexes    throughout      Assessment & Plan    Routine Health Maintenance and Physical Exam  Exercise Activities and Dietary recommendations Goals     Increase water intake     Recommend increasing water intake to 4-6 glasses a day.      Quit Smoking       Immunization History  Administered Date(s) Administered   Fluad Quad(high Dose 65+) 11/04/2018   Influenza, High Dose Seasonal PF 11/23/2016, 12/28/2017   Influenza,inj,Quad PF,6+ Mos 01/30/2015   PFIZER SARS-COV-2 Vaccination 03/07/2019, 03/28/2019  Pneumococcal Conjugate-13 01/30/2015   Pneumococcal Polysaccharide-23 08/21/2016   Tdap 09/17/2010    Health Maintenance  Topic  Date Due   COLONOSCOPY  07/10/2019   INFLUENZA VACCINE  08/27/2019   TETANUS/TDAP  09/16/2020   DEXA SCAN  12/07/2020   MAMMOGRAM  01/30/2021   COVID-19 Vaccine  Completed   Hepatitis C Screening  Completed   PNA vac Low Risk Adult  Completed    Discussed health benefits of physical activity, and encouraged her to engage in regular exercise appropriate for her age and condition.  1. Essential (primary) hypertension Asymmetric blood pressures in UEs. Has follow up with vascular later this week. Continue current medications for the time being.  - EKG 12-Lead  2. Aortic atherosclerosis (HCC) Asymptomatic. Compliant with medication.  Continue aggressive risk factor modification.    3. Hypercholesterolemia without hypertriglyceridemia She is tolerating rosuvastatin well with no adverse effects.   - CBC - Comprehensive metabolic panel - Lipid panel  4. Need for influenza vaccination  - Flu Vaccine QUAD High Dose IM (Fluad)  5. Pre-diabetes  - Hemoglobin A1c        The entirety of the information documented in the History of Present Illness, Review of Systems and Physical Exam were personally obtained by me. Portions of this information were initially documented by the CMA and reviewed by me for thoroughness and accuracy.      Lelon Huh, MD  [facility] [phone] (phone) [phone] (fax)  [place]

## 2019-11-20 ENCOUNTER — Other Ambulatory Visit: Payer: Self-pay

## 2019-11-20 ENCOUNTER — Encounter: Payer: Self-pay | Admitting: Family Medicine

## 2019-11-20 ENCOUNTER — Other Ambulatory Visit: Payer: Self-pay | Admitting: Family Medicine

## 2019-11-20 ENCOUNTER — Ambulatory Visit (INDEPENDENT_AMBULATORY_CARE_PROVIDER_SITE_OTHER): Payer: Medicare PPO | Admitting: Family Medicine

## 2019-11-20 ENCOUNTER — Encounter: Payer: Medicare PPO | Admitting: Family Medicine

## 2019-11-20 ENCOUNTER — Ambulatory Visit (INDEPENDENT_AMBULATORY_CARE_PROVIDER_SITE_OTHER): Payer: Medicare PPO

## 2019-11-20 VITALS — BP 108/80 | HR 73 | Temp 98.2°F | Resp 16 | Ht 66.0 in | Wt 179.0 lb

## 2019-11-20 DIAGNOSIS — E7800 Pure hypercholesterolemia, unspecified: Secondary | ICD-10-CM | POA: Diagnosis not present

## 2019-11-20 DIAGNOSIS — R011 Cardiac murmur, unspecified: Secondary | ICD-10-CM | POA: Insufficient documentation

## 2019-11-20 DIAGNOSIS — Z0000 Encounter for general adult medical examination without abnormal findings: Secondary | ICD-10-CM | POA: Diagnosis not present

## 2019-11-20 DIAGNOSIS — R7303 Prediabetes: Secondary | ICD-10-CM | POA: Diagnosis not present

## 2019-11-20 DIAGNOSIS — I700 Atherosclerosis of aorta: Secondary | ICD-10-CM | POA: Diagnosis not present

## 2019-11-20 DIAGNOSIS — Z23 Encounter for immunization: Secondary | ICD-10-CM

## 2019-11-20 DIAGNOSIS — I10 Essential (primary) hypertension: Secondary | ICD-10-CM | POA: Diagnosis not present

## 2019-11-20 DIAGNOSIS — F17210 Nicotine dependence, cigarettes, uncomplicated: Secondary | ICD-10-CM

## 2019-11-20 NOTE — Patient Instructions (Signed)
Ms. Angela Winters , Thank you for taking time to come for your Medicare Wellness Visit. I appreciate your ongoing commitment to your health goals. Please review the following plan we discussed and let me know if I can assist you in the future.   Screening recommendations/referrals: Colonoscopy: Currently due. Pt to call and schedule apt this fall. Mammogram: Up to date, due 01/2020 Bone Density: Up to date, due 11/2020 Recommended yearly ophthalmology/optometry visit for glaucoma screening and checkup Recommended yearly dental visit for hygiene and checkup  Vaccinations: Influenza vaccine: Currently due. Will receive at in office apt today. Pneumococcal vaccine: Completed series Tdap vaccine: Up to date, due 08/2020 Shingles vaccine: Shingrix discussed. Please contact your pharmacy for coverage information.     Advanced directives: Advance directive discussed with you today. Even though you declined this today please call our office should you change your mind and we can give you the proper paperwork for you to fill out.  Conditions/risks identified: Recommend to increase water intake to 6-8 8 oz glasses a day.   Next appointment: 9:00 AM today with Dr Caryn Section. Declined scheduling an AWV for 2022 at this time.    Preventive Care 2 Years and Older, Female Preventive care refers to lifestyle choices and visits with your health care provider that can promote health and wellness. What does preventive care include?  A yearly physical exam. This is also called an annual well check.  Dental exams once or twice a year.  Routine eye exams. Ask your health care provider how often you should have your eyes checked.  Personal lifestyle choices, including:  Daily care of your teeth and gums.  Regular physical activity.  Eating a healthy diet.  Avoiding tobacco and drug use.  Limiting alcohol use.  Practicing safe sex.  Taking low-dose aspirin every day.  Taking vitamin and mineral  supplements as recommended by your health care provider. What happens during an annual well check? The services and screenings done by your health care provider during your annual well check will depend on your age, overall health, lifestyle risk factors, and family history of disease. Counseling  Your health care provider may ask you questions about your:  Alcohol use.  Tobacco use.  Drug use.  Emotional well-being.  Home and relationship well-being.  Sexual activity.  Eating habits.  History of falls.  Memory and ability to understand (cognition).  Work and work Statistician.  Reproductive health. Screening  You may have the following tests or measurements:  Height, weight, and BMI.  Blood pressure.  Lipid and cholesterol levels. These may be checked every 5 years, or more frequently if you are over 34 years old.  Skin check.  Lung cancer screening. You may have this screening every year starting at age 2 if you have a 30-pack-year history of smoking and currently smoke or have quit within the past 15 years.  Fecal occult blood test (FOBT) of the stool. You may have this test every year starting at age 29.  Flexible sigmoidoscopy or colonoscopy. You may have a sigmoidoscopy every 5 years or a colonoscopy every 10 years starting at age 78.  Hepatitis C blood test.  Hepatitis B blood test.  Sexually transmitted disease (STD) testing.  Diabetes screening. This is done by checking your blood sugar (glucose) after you have not eaten for a while (fasting). You may have this done every 1-3 years.  Bone density scan. This is done to screen for osteoporosis. You may have this done starting at age  65.  Mammogram. This may be done every 1-2 years. Talk to your health care provider about how often you should have regular mammograms. Talk with your health care provider about your test results, treatment options, and if necessary, the need for more tests. Vaccines  Your  health care provider may recommend certain vaccines, such as:  Influenza vaccine. This is recommended every year.  Tetanus, diphtheria, and acellular pertussis (Tdap, Td) vaccine. You may need a Td booster every 10 years.  Zoster vaccine. You may need this after age 43.  Pneumococcal 13-valent conjugate (PCV13) vaccine. One dose is recommended after age 93.  Pneumococcal polysaccharide (PPSV23) vaccine. One dose is recommended after age 60. Talk to your health care provider about which screenings and vaccines you need and how often you need them. This information is not intended to replace advice given to you by your health care provider. Make sure you discuss any questions you have with your health care provider. Document Released: 02/08/2015 Document Revised: 10/02/2015 Document Reviewed: 11/13/2014 Elsevier Interactive Patient Education  2017 [employer] Prevention in the Home Falls can cause injuries. They can happen to people of all ages. There are many things you can do to make your home safe and to help prevent falls. What can I do on the outside of my home?  Regularly fix the edges of walkways and driveways and fix any cracks.  Remove anything that might make you trip as you walk through a door, such as a raised step or threshold.  Trim any bushes or trees on the path to your home.  Use bright outdoor lighting.  Clear any walking paths of anything that might make someone trip, such as rocks or tools.  Regularly check to see if handrails are loose or broken. Make sure that both sides of any steps have handrails.  Any raised decks and porches should have guardrails on the edges.  Have any leaves, snow, or ice cleared regularly.  Use sand or salt on walking paths during winter.  Clean up any spills in your garage right away. This includes oil or grease spills. What can I do in the bathroom?  Use night lights.  Install grab bars by the toilet and in the tub and  shower. Do not use towel bars as grab bars.  Use non-skid mats or decals in the tub or shower.  If you need to sit down in the shower, use a plastic, non-slip stool.  Keep the floor dry. Clean up any water that spills on the floor as soon as it happens.  Remove soap buildup in the tub or shower regularly.  Attach bath mats securely with double-sided non-slip rug tape.  Do not have throw rugs and other things on the floor that can make you trip. What can I do in the bedroom?  Use night lights.  Make sure that you have a light by your bed that is easy to reach.  Do not use any sheets or blankets that are too big for your bed. They should not hang down onto the floor.  Have a firm chair that has side arms. You can use this for support while you get dressed.  Do not have throw rugs and other things on the floor that can make you trip. What can I do in the kitchen?  Clean up any spills right away.  Avoid walking on wet floors.  Keep items that you use a lot in easy-to-reach places.  If you need  to reach something above you, use a strong step stool that has a grab bar.  Keep electrical cords out of the way.  Do not use floor polish or wax that makes floors slippery. If you must use wax, use non-skid floor wax.  Do not have throw rugs and other things on the floor that can make you trip. What can I do with my stairs?  Do not leave any items on the stairs.  Make sure that there are handrails on both sides of the stairs and use them. Fix handrails that are broken or loose. Make sure that handrails are as long as the stairways.  Check any carpeting to make sure that it is firmly attached to the stairs. Fix any carpet that is loose or worn.  Avoid having throw rugs at the top or bottom of the stairs. If you do have throw rugs, attach them to the floor with carpet tape.  Make sure that you have a light switch at the top of the stairs and the bottom of the stairs. If you do not  have them, ask someone to add them for you. What else can I do to help prevent falls?  Wear shoes that:  Do not have high heels.  Have rubber bottoms.  Are comfortable and fit you well.  Are closed at the toe. Do not wear sandals.  If you use a stepladder:  Make sure that it is fully opened. Do not climb a closed stepladder.  Make sure that both sides of the stepladder are locked into place.  Ask someone to hold it for you, if possible.  Clearly mark and make sure that you can see:  Any grab bars or handrails.  First and last steps.  Where the edge of each step is.  Use tools that help you move around (mobility aids) if they are needed. These include:  Canes.  Walkers.  Scooters.  Crutches.  Turn on the lights when you go into a dark area. Replace any light bulbs as soon as they burn out.  Set up your furniture so you have a clear path. Avoid moving your furniture around.  If any of your floors are uneven, fix them.  If there are any pets around you, be aware of where they are.  Review your medicines with your doctor. Some medicines can make you feel dizzy. This can increase your chance of falling. Ask your doctor what other things that you can do to help prevent falls. This information is not intended to replace advice given to you by your health care provider. Make sure you discuss any questions you have with your health care provider. Document Released: 11/08/2008 Document Revised: 06/20/2015 Document Reviewed: 02/16/2014 Elsevier Interactive Patient Education  2017 [employer].

## 2019-11-20 NOTE — Patient Instructions (Signed)
.   Please review the attached list of medications and notify my office if there are any errors.    Bupropion (aka Zyban) helps cut back on cigarette cravings and stop smoking. Let me know if you want to try this medication to help you stop smoking.    Call [facility] to schedule your follow up colonoscopy.

## 2019-11-20 NOTE — Progress Notes (Signed)
Subjective:   Angela Winters is a 69 y.o. female who presents for Medicare Annual (Subsequent) preventive examination.  I connected with Kierston Plasencia today by telephone and verified that I am speaking with the correct person using two identifiers. Location patient: home Location provider: work Persons participating in the virtual visit: patient, provider.   I discussed the limitations, risks, security and privacy concerns of performing an evaluation and management service by telephone and the availability of in person appointments. I also discussed with the patient that there may be a patient responsible charge related to this service. The patient expressed understanding and verbally consented to this telephonic visit.    Interactive audio and video telecommunications were attempted between this provider and patient, however failed, due to patient having technical difficulties OR patient did not have access to video capability.  We continued and completed visit with audio only.   Review of Systems    N/A  Cardiac Risk Factors include: advanced age (>70men, >57 women);dyslipidemia;hypertension;smoking/ tobacco exposure     Objective:    There were no vitals filed for this visit. There is no height or weight on file to calculate BMI.  Advanced Directives 11/20/2019 08/29/2018 08/25/2017 08/21/2016 08/05/2015  Does Patient Have a Medical Advance Directive? No No No No No  Does patient want to make changes to medical advance directive? - - No - Patient declined - -  Would patient like information on creating a medical advance directive? No - Patient declined No - Patient declined - No - Patient declined -    Current Medications (verified) Outpatient Encounter Medications as of 11/20/2019  Medication Sig  . aspirin 81 MG tablet Take 81 mg by mouth daily.   . cholecalciferol (VITAMIN D) 1000 UNITS tablet VITAMIN D, 1000UNIT (Oral Tablet)  1 Every Day for 0 days  Quantity: 0.00;  Refills:  0   Ordered :09-Jan-2010  Darlin Priestly ;  Started 11-Sep-2008 Active  . citalopram (CELEXA) 40 MG tablet TAKE 1 TABLET BY MOUTH EVERY DAY  . lisinopril (ZESTRIL) 10 MG tablet Take 1 tablet (10 mg total) by mouth daily.  . meloxicam (MOBIC) 15 MG tablet TAKE 1 TABLET (15 MG TOTAL) BY MOUTH DAILY. AS NEEDED  . metoprolol succinate (TOPROL-XL) 50 MG 24 hr tablet Take 1 tablet (50 mg total) by mouth daily. Take with or immediately following a meal.  . MULTIPLE VITAMIN PO 1 tablet daily.  . rosuvastatin (CRESTOR) 40 MG tablet Take 1 tablet (40 mg total) by mouth daily.   No facility-administered encounter medications on file as of 11/20/2019.    Allergies (verified) Codeine   History: Past Medical History:  Diagnosis Date  . Chronic radicular low back pain   . Hypertension   . Sciatica of left side   . Spinal stenosis at L4-L5 level    Past Surgical History:  Procedure Laterality Date  . REPLACEMENT TOTAL KNEE Left 07/24/2013   Dr. Marry Guan, [facility]   Family History  Problem Relation Age of Onset  . Cancer Mother        Colon Cancer  . Breast cancer Neg Hx    Social History   Socioeconomic History  . Marital status: Married    Spouse name: Not on file  . Number of children: 1  . Years of education: Not on file  . Highest education level: 12th grade  Occupational History  . Occupation: retired  Tobacco Use  . Smoking status: Current Every Day Smoker    Packs/day: 0.75  Years: 50.00    Pack years: 37.50    Types: Cigarettes  . Smokeless tobacco: Never Used  Vaping Use  . Vaping Use: Never used  Substance and Sexual Activity  . Alcohol use: Yes    Alcohol/week: 2.0 - 3.0 standard drinks    Types: 2 - 3 Shots of liquor per week  . Drug use: No  . Sexual activity: Not on file  Other Topics Concern  . Not on file  Social History Narrative  . Not on file   Social Determinants of Health   Financial Resource Strain: Low Risk   . Difficulty of Paying Living  Expenses: Not hard at all  Food Insecurity: No Food Insecurity  . Worried About Charity fundraiser in the Last Year: Never true  . Ran Out of Food in the Last Year: Never true  Transportation Needs: No Transportation Needs  . Lack of Transportation (Medical): No  . Lack of Transportation (Non-Medical): No  Physical Activity: Inactive  . Days of Exercise per Week: 0 days  . Minutes of Exercise per Session: 0 min  Stress: No Stress Concern Present  . Feeling of Stress : Not at all  Social Connections: Moderately Isolated  . Frequency of Communication with Friends and Family: More than three times a week  . Frequency of Social Gatherings with Friends and Family: Twice a week  . Attends Religious Services: Never  . Active Member of Clubs or Organizations: No  . Attends Archivist Meetings: Never  . Marital Status: Married    Tobacco Counseling Ready to quit: No Counseling given: No   Clinical Intake:  Pre-visit preparation completed: Yes  Pain : No/denies pain     Nutritional Risks: None Diabetes: No  How often do you need to have someone help you when you read instructions, pamphlets, or other written materials from your doctor or pharmacy?: 1 - Never  Diabetic? No  Interpreter Needed?: No  Information entered by :: [facility], LPN   Activities of Daily Living In your present state of health, do you have any difficulty performing the following activities: 11/20/2019  Hearing? N  Vision? N  Difficulty concentrating or making decisions? N  Walking or climbing stairs? N  Dressing or bathing? N  Doing errands, shopping? N  Preparing Food and eating ? N  Using the Toilet? N  In the past six months, have you accidently leaked urine? N  Do you have problems with loss of bowel control? N  Managing your Medications? N  Managing your Finances? N  Housekeeping or managing your Housekeeping? N  Some recent data might be hidden    Patient Care Team: Birdie Sons, MD as PCP - General (Family Medicine) Anell Barr, OD as Consulting Physician (Optometry)  Indicate any recent Medical Services you may have received from other than [facility] providers in the past year (date may be approximate).     Assessment:   This is a routine wellness examination for [place].  Hearing/Vision screen No exam data present  Dietary issues and exercise activities discussed: Current Exercise Habits: The patient does not participate in regular exercise at present, Exercise limited by: None identified  Goals    . Increase water intake     Recommend increasing water intake to 4-6 glasses a day.     . Quit Smoking      Depression Screen PHQ 2/9 Scores 11/20/2019 08/29/2018 01/06/2018 08/25/2017 08/25/2017 08/21/2016 08/21/2016  PHQ - 2 Score 0 0  0 0 0 0 0  PHQ- 9 Score - - - 0 - 2 -    Fall Risk Fall Risk  11/20/2019 05/30/2019 08/29/2018 01/06/2018 08/25/2017  Falls in the past year? 0 0 0 0 No  Number falls in past yr: 0 0 - - -  Injury with Fall? 0 0 - - -    Any stairs in or around the home? Yes  If so, are there any without handrails? No  Home free of loose throw rugs in walkways, pet beds, electrical cords, etc? Yes  Adequate lighting in your home to reduce risk of falls? Yes   ASSISTIVE DEVICES UTILIZED TO PREVENT FALLS:  Life alert? No  Use of a cane, walker or w/c? No  Grab bars in the bathroom? No  Shower chair or bench in shower? No  Elevated toilet seat or a handicapped toilet? No    Cognitive Function:     6CIT Screen 11/20/2019 08/29/2018 08/25/2017 08/21/2016  What Year? 0 points 0 points 0 points 0 points  What month? 0 points 0 points 0 points 0 points  What time? 0 points 0 points 0 points 0 points  Count back from 20 0 points 0 points 0 points 0 points  Months in reverse 0 points 0 points 0 points 0 points  Repeat phrase 0 points 0 points 2 points 0 points  Total Score 0 0 2 0    Immunizations Immunization History  Administered  Date(s) Administered  . Fluad Quad(high Dose 65+) 11/04/2018  . Influenza, High Dose Seasonal PF 11/23/2016, 12/28/2017  . Influenza,inj,Quad PF,6+ Mos 01/30/2015  . PFIZER SARS-COV-2 Vaccination 03/07/2019, 03/28/2019  . Pneumococcal Conjugate-13 01/30/2015  . Pneumococcal Polysaccharide-23 08/21/2016  . Tdap 09/17/2010    TDAP status: Up to date Flu Vaccine status: Declined, Education has been provided regarding the importance of this vaccine but patient still declined. Advised may receive this vaccine at local pharmacy or Health Dept. Aware to provide a copy of the vaccination record if obtained from local pharmacy or Health Dept. Verbalized acceptance and understanding. Pneumococcal vaccine status: Up to date Covid-19 vaccine status: Completed vaccines  Qualifies for Shingles Vaccine? Yes   Zostavax completed No   Shingrix Completed?: No.    Education has been provided regarding the importance of this vaccine. Patient has been advised to call insurance company to determine out of pocket expense if they have not yet received this vaccine. Advised may also receive vaccine at local pharmacy or Health Dept. Verbalized acceptance and understanding.  Screening Tests Health Maintenance  Topic Date Due  . COLONOSCOPY  07/10/2019  . INFLUENZA VACCINE  08/27/2019  . TETANUS/TDAP  09/16/2020  . DEXA SCAN  12/07/2020  . MAMMOGRAM  01/30/2021  . COVID-19 Vaccine  Completed  . Hepatitis C Screening  Completed  . PNA vac Low Risk Adult  Completed    Health Maintenance  Health Maintenance Due  Topic Date Due  . COLONOSCOPY  07/10/2019  . INFLUENZA VACCINE  08/27/2019    Colorectal cancer screening: Completed 09/08/16. Repeat every 5 years Mammogram status: Completed 1/01/31/19. Repeat every year Bone Density status: Completed 12/08/18. Results reflect: Bone density results: OSTEOPENIA. Repeat every 2 years.  Lung Cancer Screening: (Low Dose CT Chest recommended if Age 75-80 years, 30  pack-year currently smoking OR have quit w/in 15years.) does qualify however completed 09/03/19. Repeat yearly.  Additional Screening:  Hepatitis C Screening: Up to date  Vision Screening: Recommended annual ophthalmology exams for early detection of glaucoma  and other disorders of the eye. Is the patient up to date with their annual eye exam?  Yes  Who is the provider or what is the name of the office in which the patient attends annual eye exams? Dr Ellin Mayhew If pt is not established with a provider, would they like to be referred to a provider to establish care? No .   Dental Screening: Recommended annual dental exams for proper oral hygiene  Community Resource Referral / Chronic Care Management: CRR required this visit?  No   CCM required this visit?  No      Plan:     I have personally reviewed and noted the following in the patient's chart:   . Medical and social history . Use of alcohol, tobacco or illicit drugs  . Current medications and supplements . Functional ability and status . Nutritional status . Physical activity . Advanced directives . List of other physicians . Hospitalizations, surgeries, and ER visits in previous 12 months . Vitals . Screenings to include cognitive, depression, and falls . Referrals and appointments  In addition, I have reviewed and discussed with patient certain preventive protocols, quality metrics, and best practice recommendations. A written personalized care plan for preventive services as well as general preventive health recommendations were provided to patient.     Kamil [place], [place]   [id number]   Nurse Notes: Pt would like to receive a flu shot at today's in office apt. Pt plans to call GI and schedule a colonoscopy.

## 2019-11-21 LAB — LIPID PANEL
Chol/HDL Ratio: 2.7 ratio (ref 0.0–4.4)
Cholesterol, Total: 162 mg/dL (ref 100–199)
HDL: 61 mg/dL (ref >39)
LDL Chol Calc (NIH): 74 mg/dL (ref 0–99)
Triglycerides: 160 mg/dL — ABNORMAL HIGH (ref 0–149)
VLDL Cholesterol Cal: 27 mg/dL (ref 5–40)

## 2019-11-21 LAB — COMPREHENSIVE METABOLIC PANEL
ALT: 20 IU/L (ref 0–32)
AST: 17 IU/L (ref 0–40)
Albumin/Globulin Ratio: 2 (ref 1.2–2.2)
Albumin: 4.5 g/dL (ref 3.8–4.8)
Alkaline Phosphatase: 71 IU/L (ref 44–121)
BUN/Creatinine Ratio: 17 (ref 12–28)
BUN: 13 mg/dL (ref 8–27)
Bilirubin Total: 0.2 mg/dL (ref 0.0–1.2)
CO2: 21 mmol/L (ref 20–29)
Calcium: 9.5 mg/dL (ref 8.7–10.3)
Chloride: 106 mmol/L (ref 96–106)
Creatinine, Ser: 0.78 mg/dL (ref 0.57–1.00)
GFR calc Af Amer: 89 mL/min/{1.73_m2} (ref >59)
GFR calc non Af Amer: 77 mL/min/{1.73_m2} (ref >59)
Globulin, Total: 2.2 g/dL (ref 1.5–4.5)
Glucose: 116 mg/dL — ABNORMAL HIGH (ref 65–99)
Potassium: 4.1 mmol/L (ref 3.5–5.2)
Sodium: 146 mmol/L — ABNORMAL HIGH (ref 134–144)
Total Protein: 6.7 g/dL (ref 6.0–8.5)

## 2019-11-21 LAB — CBC
Hematocrit: 44.8 % (ref 34.0–46.6)
Hemoglobin: 15.1 g/dL (ref 11.1–15.9)
MCH: 32.2 pg (ref 26.6–33.0)
MCHC: 33.7 g/dL (ref 31.5–35.7)
MCV: 96 fL (ref 79–97)
Platelets: 219 10*3/uL (ref 150–450)
RBC: 4.69 x10E6/uL (ref 3.77–5.28)
RDW: 13.2 % (ref 11.7–15.4)
WBC: 8.9 10*3/uL (ref 3.4–10.8)

## 2019-11-23 ENCOUNTER — Encounter (INDEPENDENT_AMBULATORY_CARE_PROVIDER_SITE_OTHER): Payer: Self-pay | Admitting: Nurse Practitioner

## 2019-11-23 ENCOUNTER — Other Ambulatory Visit: Payer: Self-pay

## 2019-11-23 ENCOUNTER — Ambulatory Visit (INDEPENDENT_AMBULATORY_CARE_PROVIDER_SITE_OTHER): Payer: Medicare PPO

## 2019-11-23 ENCOUNTER — Ambulatory Visit (INDEPENDENT_AMBULATORY_CARE_PROVIDER_SITE_OTHER): Payer: Medicare PPO | Admitting: Nurse Practitioner

## 2019-11-23 VITALS — BP 137/75 | HR 86 | Resp 17 | Ht 66.0 in | Wt 177.0 lb

## 2019-11-23 DIAGNOSIS — I700 Atherosclerosis of aorta: Secondary | ICD-10-CM | POA: Diagnosis not present

## 2019-11-23 DIAGNOSIS — E7800 Pure hypercholesterolemia, unspecified: Secondary | ICD-10-CM | POA: Diagnosis not present

## 2019-11-23 DIAGNOSIS — I6523 Occlusion and stenosis of bilateral carotid arteries: Secondary | ICD-10-CM

## 2019-11-23 DIAGNOSIS — J439 Emphysema, unspecified: Secondary | ICD-10-CM | POA: Diagnosis not present

## 2019-11-23 DIAGNOSIS — I10 Essential (primary) hypertension: Secondary | ICD-10-CM | POA: Diagnosis not present

## 2019-11-23 NOTE — Progress Notes (Signed)
MRN : [id number]  Angela Winters is a 69 y.o. (03/24/1950) female who presents with chief complaint of  Chief Complaint  Patient presents with  . Follow-up  .  History of Present Illness:   The patient is seen for evaluation of diminished pulses in the left arm. Patient denies pain in the left arm.  She denies lower extremity claudication system.  The patient denies rest pain or dangling of an extremity off the side of the bed during the night for relief. No open wounds or sores at this time. No prior interventions or surgeries.  No history of back problems or DJD of the lumbar sacral spine but she does suffer from DJD ans is s/p left TKR.   The patient denies rest pain symptoms.  No new ulcers or wounds of the left hand or of the foot.  The patient's blood pressure has been stable and relatively well controlled. The patient denies amaurosis fugax or recent TIA symptoms. There are no recent neurological changes noted. The patient denies history of DVT, PE or superficial thrombophlebitis.   Today patient has an ABI 1.22 on the right and 1.23 on the left.  The patient has triphasic tibial artery waveforms bilaterally with good toe waveforms bilaterally.  Today the patient has a carotid artery duplex which reveals 40 to 59% stenosis noted bilaterally.  The right vertebral artery has antegrade flow while the left vertebral artery demonstrates bidirectional flow.  Normal flow hemodynamics seen within the bilateral subclavian arteries however it is noted that the systolic pressure in the right arm is 151 whereas the left is 119 suggestive of a possible left subclavian stenosis.  Current Meds  Medication Sig  . aspirin 81 MG tablet Take 81 mg by mouth daily.   . cholecalciferol (VITAMIN D) 1000 UNITS tablet VITAMIN D, 1000UNIT (Oral Tablet)  1 Every Day for 0 days  Quantity: 0.00;  Refills: 0   Ordered :09-Jan-2010  Darlin Priestly ;  Started 11-Sep-2008 Active  . citalopram  (CELEXA) 40 MG tablet TAKE 1 TABLET BY MOUTH EVERY DAY  . lisinopril (ZESTRIL) 10 MG tablet Take 1 tablet (10 mg total) by mouth daily.  . meloxicam (MOBIC) 15 MG tablet TAKE 1 TABLET (15 MG TOTAL) BY MOUTH DAILY. AS NEEDED  . metoprolol succinate (TOPROL-XL) 50 MG 24 hr tablet Take 1 tablet (50 mg total) by mouth daily. Take with or immediately following a meal.  . MULTIPLE VITAMIN PO 1 tablet daily.  . rosuvastatin (CRESTOR) 40 MG tablet Take 1 tablet (40 mg total) by mouth daily.    Past Medical History:  Diagnosis Date  . Chronic radicular low back pain   . Hypertension   . Sciatica of left side   . Spinal stenosis at L4-L5 level     Past Surgical History:  Procedure Laterality Date  . REPLACEMENT TOTAL KNEE Left 07/24/2013   Dr. Marry Guan, [facility]    Social History Social History   Tobacco Use  . Smoking status: Current Every Day Smoker    Packs/day: 0.75    Years: 50.00    Pack years: 37.50    Types: Cigarettes  . Smokeless tobacco: Never Used  Vaping Use  . Vaping Use: Never used  Substance Use Topics  . Alcohol use: Yes    Alcohol/week: 2.0 - 3.0 standard drinks    Types: 2 - 3 Shots of liquor per week  . Drug use: No    Family History Family History  Problem Relation Age of  Onset  . Cancer Mother        Colon Cancer  . Breast cancer Neg Hx     Allergies  Allergen Reactions  . Codeine Nausea Only     REVIEW OF SYSTEMS (Negative unless checked)  Constitutional: [] Weight loss  [] Fever  [] Chills Cardiac: [] Chest pain   [] Chest pressure   [] Palpitations   [] Shortness of breath when laying flat   [] Shortness of breath with exertion. Vascular:  [] Pain in legs with walking   [] Pain in legs at rest  [] History of DVT   [] Phlebitis   [] Swelling in legs   [] Varicose veins   [] Non-healing ulcers Pulmonary:   [] Uses home oxygen   [] Productive cough   [] Hemoptysis   [] Wheeze  [x] COPD   [] Asthma Neurologic:  [] Dizziness   [] Seizures   [] History of stroke   [] History of  TIA  [] Aphasia   [] Vissual changes   [] Weakness or numbness in arm   [] Weakness or numbness in leg Musculoskeletal:   [] Joint swelling   [] Joint pain   [] Low back pain Hematologic:  [] Easy bruising  [] Easy bleeding   [] Hypercoagulable state   [] Anemic Gastrointestinal:  [] Diarrhea   [] Vomiting  [] Gastroesophageal reflux/heartburn   [] Difficulty swallowing. Genitourinary:  [] Chronic kidney disease   [] Difficult urination  [] Frequent urination   [] Blood in urine Skin:  [] Rashes   [] Ulcers  Psychological:  [] History of anxiety   []  History of major depression.  Physical Examination  Vitals:   11/23/19 1419  BP: 137/75  Pulse: 86  Resp: 17  Weight: 177 lb (80.3 kg)  Height: 5\' 6"  (1.676 m)   Body mass index is 28.57 kg/m. Gen: WD/WN, NAD Head: /AT, No temporalis wasting.  Ear/Nose/Throat: Hearing grossly intact, nares w/o erythema or drainage Eyes: PER, EOMI, sclera nonicteric.  Neck: Supple, no large masses.   Pulmonary:  Good air movement, no audible wheezing bilaterally, no use of accessory muscles.  Cardiac: RRR, no JVD, murmur Vascular: carotid bruit bilaterally Vessel Right Left  Radial Palpable Palpable  PT Palpable Palpable  DP Palpable Palpable  Gastrointestinal: Non-distended. No guarding/no peritoneal signs.  Musculoskeletal: M/S 5/5 throughout.  No deformity or atrophy.  Neurologic: CN 2-12 intact. Symmetrical.  Speech is fluent. Motor exam as listed above. Psychiatric: Judgment intact, Mood & affect appropriate for pt's clinical situation. Dermatologic: No rashes or ulcers noted.  No changes consistent with cellulitis. Lymph : No lichenification or skin changes of chronic lymphedema.  CBC Lab Results  Component Value Date   WBC 8.9 11/20/2019   HGB 15.1 11/20/2019   HCT 44.8 11/20/2019   MCV 96 11/20/2019   PLT 219 11/20/2019    BMET    Component Value Date/Time   NA 146 (H) 11/20/2019 1015   NA 135 (L) 07/26/2013 0530   K 4.1 11/20/2019 1015   K 4.4  07/26/2013 0530   CL 106 11/20/2019 1015   CL 104 07/26/2013 0530   CO2 21 11/20/2019 1015   CO2 25 07/26/2013 0530   GLUCOSE 116 (H) 11/20/2019 1015   GLUCOSE 105 (H) 07/26/2013 0530   BUN 13 11/20/2019 1015   BUN 11 07/26/2013 0530   CREATININE 0.78 11/20/2019 1015   CREATININE 0.60 07/26/2013 0530   CALCIUM 9.5 11/20/2019 1015   CALCIUM 8.1 (L) 07/26/2013 0530   GFRNONAA 77 11/20/2019 1015   GFRNONAA >60 07/26/2013 0530   GFRAA 89 11/20/2019 1015   GFRAA >60 07/26/2013 0530   Estimated Creatinine Clearance: 69.9 mL/min (by C-G formula based on SCr  of 0.78 mg/dL).  COAG Lab Results  Component Value Date   INR 0.9 07/12/2013    Radiology VAS [place] ABI WITH/WO TBI  Result Date: 11/23/2019 LOWER EXTREMITY DOPPLER STUDY Indications: Claudication, and Diminished pulses.  Comparison Study: 11/22/2018 Performing Technologist: Charlane Ferretti RT (R)(VS)  Examination Guidelines: A complete evaluation includes at minimum, Doppler waveform signals and systolic blood pressure reading at the level of bilateral brachial, anterior tibial, and posterior tibial arteries, when vessel segments are accessible. Bilateral testing is considered an integral part of a complete examination. Photoelectric Plethysmograph (PPG) waveforms and toe systolic pressure readings are included as required and additional duplex testing as needed. Limited examinations for reoccurring indications may be performed as noted.  ABI Findings: +---------+------------------+-----+---------+--------+ Right    Rt Pressure (mmHg)IndexWaveform Comment  +---------+------------------+-----+---------+--------+ Brachial 151                                      +---------+------------------+-----+---------+--------+ ATA      173               1.15 triphasic         +---------+------------------+-----+---------+--------+ PTA      184               1.22 triphasic          +---------+------------------+-----+---------+--------+ Great Toe151               1.00 Normal            +---------+------------------+-----+---------+--------+ +---------+------------------+-----+---------+-------+ Left     Lt Pressure (mmHg)IndexWaveform Comment +---------+------------------+-----+---------+-------+ Brachial 119                                     +---------+------------------+-----+---------+-------+ ATA      133               0.88 triphasic        +---------+------------------+-----+---------+-------+ PTA      185               1.23 biphasic         +---------+------------------+-----+---------+-------+ Great Toe144               0.95 Normal           +---------+------------------+-----+---------+-------+ +-------+-----------+-----------+------------+------------+ ABI/TBIToday's ABIToday's TBIPrevious ABIPrevious TBI +-------+-----------+-----------+------------+------------+ Right  1.22       1.0        1.14        1.02         +-------+-----------+-----------+------------+------------+ Left   1.23       .95        1.19        .86          +-------+-----------+-----------+------------+------------+ Bilateral ABIs appear essentially unchanged compared to prior study on 11/22/2018. Bilateral TBIs appear essentially unchanged compared to prior study on 11/22/2018.  Summary: Right: Resting right ankle-brachial index is within normal range. No evidence of significant right lower extremity arterial disease. The right toe-brachial index is normal. Left: Resting left ankle-brachial index is within normal range. No evidence of significant left lower extremity arterial disease. The left toe-brachial index is normal. *See table(s) above for measurements and observations.  Electronically signed by Hortencia Pilar MD on 11/23/2019 at 4:51:25 PM.   Final    VAS US CAROTID  Result Date: 11/23/2019 Carotid Arterial Duplex Study  Comparison Study:   11/22/2018 Performing Technologist: Charlane Ferretti RT (R)(VS)  Examination Guidelines: A complete evaluation includes B-mode imaging, spectral Doppler, color Doppler, and power Doppler as needed of all accessible portions of each vessel. Bilateral testing is considered an integral part of a complete examination. Limited examinations for reoccurring indications may be performed as noted.  Right Carotid Findings: +----------+--------+--------+--------+------------------+-------------------+           PSV cm/sEDV cm/sStenosisPlaque DescriptionComments            +----------+--------+--------+--------+------------------+-------------------+ CCA Prox  77      18                                tortuous            +----------+--------+--------+--------+------------------+-------------------+ CCA Mid   93      22                                intimal thickening  +----------+--------+--------+--------+------------------+-------------------+ CCA Distal141     32              calcific                              +----------+--------+--------+--------+------------------+-------------------+ ICA Prox  128     23              calcific          ICA/CCA ratio = 1.4 +----------+--------+--------+--------+------------------+-------------------+ ICA Mid   112     25                                                    +----------+--------+--------+--------+------------------+-------------------+ ICA Distal103     19                                                    +----------+--------+--------+--------+------------------+-------------------+ ECA       343     33              calcific                              +----------+--------+--------+--------+------------------+-------------------+ +----------+--------+-------+-----------+-------------------+           PSV cm/sEDV cmsDescribe   Arm Pressure (mmHG) +----------+--------+-------+-----------+-------------------+  [id number]             Multiphasic                    +----------+--------+-------+-----------+-------------------+ +---------+--------+---+--------+--+---------+ VertebralPSV cm/s126EDV cm/s12Antegrade +---------+--------+---+--------+--+---------+ Left Carotid Findings: +----------+--------+--------+--------+------------------+--------------------+           PSV cm/sEDV cm/sStenosisPlaque DescriptionComments             +----------+--------+--------+--------+------------------+--------------------+ CCA Prox  115     11                                                     +----------+--------+--------+--------+------------------+--------------------+  CCA Mid   102     16                                                     +----------+--------+--------+--------+------------------+--------------------+ CCA Distal247     27                                                     +----------+--------+--------+--------+------------------+--------------------+ ICA Prox  117     20                                ICA/CCA ratio = 1.22 +----------+--------+--------+--------+------------------+--------------------+ ICA Mid   140     19                                                     +----------+--------+--------+--------+------------------+--------------------+ ICA Distal88      19                                                     +----------+--------+--------+--------+------------------+--------------------+ ECA       409     0                                                      +----------+--------+--------+--------+------------------+--------------------+ +----------+--------+--------+-----------+-------------------+           PSV cm/sEDV cm/sDescribe   Arm Pressure (mmHG) +----------+--------+--------+-----------+-------------------+ [id number]              Multiphasic                     +----------+--------+--------+-----------+-------------------+ +---------+--------+--+--------+-+---------------+ VertebralPSV cm/s48EDV cm/s0Bi- directional +---------+--------+--+--------+-+---------------+ Summary: Right Carotid: Velocities in the right ICA are consistent with a 40-59%                stenosis. Non-hemodynamically significant plaque <50% noted in                the CCA. The ECA appears >50% stenosed. Left Carotid: Velocities in the left ICA are consistent with a 40-59% stenosis.               Hemodynamically significant plaque >50% visualized in the CCA. The               ECA appears >50% stenosed. Vertebrals:  Right vertebral artery demonstrates antegrade flow. Left vertebral              artery demonstrates bidirectional flow.               Right BP = 151 Left BP = 119. Subclavians: Normal flow hemodynamics were seen in bilateral subclavian  arteries. *See table(s) above for measurements and observations.  Electronically signed by Hortencia Pilar MD on 11/23/2019 at 4:51:32 PM.    Final      Assessment/Plan 1. Carotid atherosclerosis, bilateral Recommend:  Given the patient's asymptomatic subcritical stenosis no further invasive testing or surgery at this time.  Duplex ultrasound shows 40 to 59% internal carotid stenosis bilaterally.   There is also noted left subclavian artery stenosis as well however the patient has no signs symptoms of worsening currently.   Continue antiplatelet therapy as prescribed Continue management of CAD, HTN and Hyperlipidemia Healthy heart diet,  encouraged exercise at least 4 times per week Follow up in 12 months with duplex ultrasound and physical exam   2. Aortic atherosclerosis (HCC) Recommend:  I do not find evidence of life style limiting vascular disease. The patient specifically denies life style limitation.  Previous noninvasive studies including ABI's of the legs do not identify critical vascular problems.  The  patient should continue walking and begin a more formal exercise program. The patient should continue his antiplatelet therapy and aggressive treatment of the lipid abnormalities.  The patient should begin wearing graduated compression socks 15-20 mmHg strength to control her mild edema.  Patient will follow-up with me on a PRN basis   3. Essential (primary) hypertension Continue antihypertensive medications as already ordered, these medications have been reviewed and there are no changes at this time.   4. Pulmonary emphysema, unspecified emphysema type ([place]) Continue pulmonary medications and aerosols as already ordered, these medications have been reviewed and there are no changes at this time.    5. Hypercholesterolemia without hypertriglyceridemia Continue statin as ordered and reviewed, no changes at this time     Kris Hartmann, NP  11/26/2019 11:02 PM

## 2019-12-19 ENCOUNTER — Other Ambulatory Visit: Payer: Self-pay | Admitting: Family Medicine

## 2019-12-19 DIAGNOSIS — Z1231 Encounter for screening mammogram for malignant neoplasm of breast: Secondary | ICD-10-CM

## 2020-02-01 ENCOUNTER — Other Ambulatory Visit: Payer: Self-pay

## 2020-02-01 ENCOUNTER — Ambulatory Visit
Admission: RE | Admit: 2020-02-01 | Discharge: 2020-02-01 | Disposition: A | Discharge status: Home / Self Care | Payer: Medicare PPO | Source: Ambulatory Visit | Attending: Family Medicine | Admitting: Family Medicine

## 2020-02-01 DIAGNOSIS — Z1231 Encounter for screening mammogram for malignant neoplasm of breast: Secondary | ICD-10-CM | POA: Insufficient documentation

## 2020-06-06 ENCOUNTER — Other Ambulatory Visit: Payer: Self-pay | Admitting: Family Medicine

## 2020-06-06 DIAGNOSIS — M5416 Radiculopathy, lumbar region: Secondary | ICD-10-CM

## 2020-06-06 MED ORDER — MELOXICAM 15 MG PO TABS: 15.0000 mg | ORAL_TABLET | Freq: Every day | ORAL | 1 refills | Status: DC

## 2020-06-06 NOTE — Telephone Encounter (Signed)
[facility] faxed refill request for the following medications:  meloxicam (MOBIC) 15 MG tablet  Last Rx: 02/17/18 LOV: 11/20/19 no upcoming visit scheduled.  Please advise. Thanks TNP

## 2020-06-15 ENCOUNTER — Other Ambulatory Visit: Payer: Self-pay | Admitting: Family Medicine

## 2020-06-15 NOTE — Telephone Encounter (Signed)
Requested medications are due for refill today.  yes  Requested medications are on the active medications list.  yes  Last refill. 03/27/2019  Future visit scheduled.   no  Notes to clinic.  Prescription is expired.

## 2020-06-16 ENCOUNTER — Other Ambulatory Visit: Payer: Self-pay | Admitting: Family Medicine

## 2020-06-16 DIAGNOSIS — E7800 Pure hypercholesterolemia, unspecified: Secondary | ICD-10-CM

## 2020-07-02 DIAGNOSIS — H43393 Other vitreous opacities, bilateral: Secondary | ICD-10-CM | POA: Diagnosis not present

## 2020-07-02 DIAGNOSIS — H2513 Age-related nuclear cataract, bilateral: Secondary | ICD-10-CM | POA: Diagnosis not present

## 2020-07-02 DIAGNOSIS — H04123 Dry eye syndrome of bilateral lacrimal glands: Secondary | ICD-10-CM | POA: Diagnosis not present

## 2020-08-22 ENCOUNTER — Other Ambulatory Visit: Payer: Self-pay | Admitting: Family Medicine

## 2020-08-22 DIAGNOSIS — I10 Essential (primary) hypertension: Secondary | ICD-10-CM

## 2020-08-22 NOTE — Telephone Encounter (Signed)
Requested medications are due for refill today.  yes  Requested medications are on the active medications list.  yes  Last refill. 05/30/2019  Future visit scheduled.   no  Notes to clinic.  Prescription is expired. Patient is more than 3 months overdue for office visit.

## 2020-08-23 ENCOUNTER — Other Ambulatory Visit: Payer: Self-pay | Admitting: Family Medicine

## 2020-08-23 DIAGNOSIS — I10 Essential (primary) hypertension: Secondary | ICD-10-CM

## 2020-08-23 NOTE — Telephone Encounter (Signed)
Requested medications are due for refill today yes  Requested medications are on the active medication list yes  Last refill 4/26  Last visit 10/2019  Future visit scheduled no  Notes to clinic Failed protocol due to no valid visit within 6  months, no upcoming appt scheduled.

## 2020-08-23 NOTE — Telephone Encounter (Signed)
Have sent 90 day prescription. Is due to have annual wellness visit scheduled on or after 11-19-2020

## 2020-11-13 ENCOUNTER — Other Ambulatory Visit (INDEPENDENT_AMBULATORY_CARE_PROVIDER_SITE_OTHER): Payer: Self-pay | Admitting: Nurse Practitioner

## 2020-11-13 DIAGNOSIS — I6523 Occlusion and stenosis of bilateral carotid arteries: Secondary | ICD-10-CM

## 2020-11-14 ENCOUNTER — Other Ambulatory Visit: Payer: Self-pay

## 2020-11-14 ENCOUNTER — Ambulatory Visit (INDEPENDENT_AMBULATORY_CARE_PROVIDER_SITE_OTHER): Payer: Medicare PPO | Admitting: Vascular Surgery

## 2020-11-14 ENCOUNTER — Ambulatory Visit (INDEPENDENT_AMBULATORY_CARE_PROVIDER_SITE_OTHER): Payer: Medicare PPO

## 2020-11-14 DIAGNOSIS — I6523 Occlusion and stenosis of bilateral carotid arteries: Secondary | ICD-10-CM | POA: Diagnosis not present

## 2020-11-15 ENCOUNTER — Encounter (INDEPENDENT_AMBULATORY_CARE_PROVIDER_SITE_OTHER): Payer: Self-pay | Admitting: *Deleted

## 2020-11-18 ENCOUNTER — Other Ambulatory Visit: Payer: Self-pay | Admitting: Family Medicine

## 2020-11-18 DIAGNOSIS — I10 Essential (primary) hypertension: Secondary | ICD-10-CM

## 2020-11-28 ENCOUNTER — Other Ambulatory Visit: Payer: Self-pay | Admitting: Family Medicine

## 2020-11-28 DIAGNOSIS — M5416 Radiculopathy, lumbar region: Secondary | ICD-10-CM

## 2020-11-28 NOTE — Telephone Encounter (Signed)
Requested medications are due for refill today.  yes  Requested medications are on the active medications list.  yes  Last refill. 06/06/2020  Future visit scheduled.   no  Notes to clinic.  Pt last seen 11/20/2019. Labs are expired.

## 2020-12-10 ENCOUNTER — Other Ambulatory Visit: Payer: Self-pay | Admitting: Family Medicine

## 2020-12-10 DIAGNOSIS — E7800 Pure hypercholesterolemia, unspecified: Secondary | ICD-10-CM

## 2020-12-10 NOTE — Telephone Encounter (Signed)
Requested medications are due for refill today yes  Requested medications are on the active medication list yes  Last refill 09/11/20  Last visit 11/20/2019  Future visit scheduled NO  Notes to clinic failed protocol of visit within 12 months, labs within 360 days, no upcoming visit scheduled, please assess. Requested Prescriptions  Pending Prescriptions Disp Refills   rosuvastatin (CRESTOR) 40 MG tablet [Pharmacy Med Name: ROSUVASTATIN CALCIUM 40 MG TAB] 90 tablet 1    Sig: TAKE 1 TABLET BY MOUTH EVERY DAY     Cardiovascular:  Antilipid - Statins Failed - 12/10/2020  1:27 AM      Failed - Total Cholesterol in normal range and within 360 days    Cholesterol, Total  Date Value Ref Range Status  11/20/2019 162 100 - 199 mg/dL Final          Failed - LDL in normal range and within 360 days    LDL Chol Calc (NIH)  Date Value Ref Range Status  11/20/2019 74 0 - 99 mg/dL Final          Failed - HDL in normal range and within 360 days    HDL  Date Value Ref Range Status  11/20/2019 61 >39 mg/dL Final          Failed - Triglycerides in normal range and within 360 days    Triglycerides  Date Value Ref Range Status  11/20/2019 160 (H) 0 - 149 mg/dL Final          Failed - Valid encounter within last 12 months    Recent Outpatient Visits           1 year ago Essential (primary) hypertension   [facility] Birdie Sons, MD   1 year ago Aortic atherosclerosis [facility])   [facility] Birdie Sons, MD   2 years ago Annual physical exam   [facility] Birdie Sons, MD   2 years ago Hypercholesterolemia without hypertriglyceridemia   [facility] Birdie Sons, MD   3 years ago Annual physical exam   [facility] Birdie Sons, MD              Passed - Patient is not pregnant

## 2021-01-10 DIAGNOSIS — H43813 Vitreous degeneration, bilateral: Secondary | ICD-10-CM | POA: Diagnosis not present

## 2021-01-10 DIAGNOSIS — H524 Presbyopia: Secondary | ICD-10-CM | POA: Diagnosis not present

## 2021-01-28 ENCOUNTER — Other Ambulatory Visit: Payer: Self-pay | Admitting: Family Medicine

## 2021-01-30 ENCOUNTER — Other Ambulatory Visit: Payer: Self-pay | Admitting: Family Medicine

## 2021-01-30 DIAGNOSIS — Z1231 Encounter for screening mammogram for malignant neoplasm of breast: Secondary | ICD-10-CM

## 2021-02-06 ENCOUNTER — Encounter: Payer: Self-pay | Admitting: Ophthalmology

## 2021-02-06 DIAGNOSIS — H2511 Age-related nuclear cataract, right eye: Secondary | ICD-10-CM | POA: Diagnosis not present

## 2021-02-17 NOTE — Discharge Instructions (Signed)

## 2021-02-19 ENCOUNTER — Ambulatory Visit: Payer: Medicare PPO | Admitting: Anesthesiology

## 2021-02-19 ENCOUNTER — Other Ambulatory Visit: Payer: Self-pay

## 2021-02-19 ENCOUNTER — Ambulatory Visit
Admission: RE | Admit: 2021-02-19 | Discharge: 2021-02-19 | Disposition: A | Discharge status: Home / Self Care | Payer: Medicare PPO | Attending: Ophthalmology | Admitting: Ophthalmology

## 2021-02-19 ENCOUNTER — Encounter
Admission: RE | Disposition: A | Discharge status: Home / Self Care | Payer: Self-pay | Source: Home / Self Care | Attending: Ophthalmology

## 2021-02-19 ENCOUNTER — Encounter: Payer: Self-pay | Admitting: Ophthalmology

## 2021-02-19 DIAGNOSIS — Z791 Long term (current) use of non-steroidal anti-inflammatories (NSAID): Secondary | ICD-10-CM | POA: Insufficient documentation

## 2021-02-19 DIAGNOSIS — F419 Anxiety disorder, unspecified: Secondary | ICD-10-CM | POA: Insufficient documentation

## 2021-02-19 DIAGNOSIS — Z79899 Other long term (current) drug therapy: Secondary | ICD-10-CM | POA: Diagnosis not present

## 2021-02-19 DIAGNOSIS — H2511 Age-related nuclear cataract, right eye: Secondary | ICD-10-CM | POA: Insufficient documentation

## 2021-02-19 DIAGNOSIS — F17210 Nicotine dependence, cigarettes, uncomplicated: Secondary | ICD-10-CM | POA: Insufficient documentation

## 2021-02-19 DIAGNOSIS — J449 Chronic obstructive pulmonary disease, unspecified: Secondary | ICD-10-CM | POA: Insufficient documentation

## 2021-02-19 DIAGNOSIS — H25811 Combined forms of age-related cataract, right eye: Secondary | ICD-10-CM | POA: Diagnosis not present

## 2021-02-19 DIAGNOSIS — I10 Essential (primary) hypertension: Secondary | ICD-10-CM | POA: Insufficient documentation

## 2021-02-19 HISTORY — PX: CATARACT EXTRACTION W/PHACO: SHX586

## 2021-02-19 SURGERY — PHACOEMULSIFICATION, CATARACT, WITH IOL INSERTION
Anesthesia: Monitor Anesthesia Care | Site: Eye | Laterality: Right

## 2021-02-19 MED ORDER — TETRACAINE HCL 0.5 % OP SOLN
1.0000 [drp] | OPHTHALMIC | Status: AC | PRN
  Administered 2021-02-19 (×3): 1 [drp] via OPHTHALMIC

## 2021-02-19 MED ORDER — CEFUROXIME OPHTHALMIC INJECTION 1 MG/0.1 ML
INJECTION | OPHTHALMIC | Status: DC | PRN
  Administered 2021-02-19: 1 mg via OPHTHALMIC

## 2021-02-19 MED ORDER — FENTANYL CITRATE (PF) 100 MCG/2ML IJ SOLN
INTRAMUSCULAR | Status: DC | PRN
Start: 2021-02-19 — End: 2021-02-19
  Administered 2021-02-19: 50 ug via INTRAVENOUS

## 2021-02-19 MED ORDER — SIGHTPATH DOSE#1 BSS IO SOLN
INTRAOCULAR | Status: DC | PRN
  Administered 2021-02-19: 15 mL

## 2021-02-19 MED ORDER — ACETAMINOPHEN 325 MG PO TABS: 325.0000 mg | ORAL_TABLET | ORAL | Status: DC | PRN

## 2021-02-19 MED ORDER — ARMC OPHTHALMIC DILATING DROPS
1.0000 "application " | OPHTHALMIC | Status: AC | PRN
  Administered 2021-02-19 (×3): 1 via OPHTHALMIC

## 2021-02-19 MED ORDER — ONDANSETRON HCL 4 MG/2ML IJ SOLN: 4.0000 mg | Freq: Once | INTRAMUSCULAR | Status: DC | PRN

## 2021-02-19 MED ORDER — MIDAZOLAM HCL 2 MG/2ML IJ SOLN
INTRAMUSCULAR | Status: DC | PRN
  Administered 2021-02-19: 1 mg via INTRAVENOUS

## 2021-02-19 MED ORDER — ACETAMINOPHEN 160 MG/5ML PO SOLN: 325.0000 mg | ORAL | Status: DC | PRN

## 2021-02-19 MED ORDER — SIGHTPATH DOSE#1 BSS IO SOLN
INTRAOCULAR | Status: DC | PRN
  Administered 2021-02-19: 1 mL via INTRAMUSCULAR

## 2021-02-19 MED ORDER — SIGHTPATH DOSE#1 NA HYALUR & NA CHOND-NA HYALUR IO KIT
PACK | INTRAOCULAR | Status: DC | PRN
  Administered 2021-02-19: 1 via OPHTHALMIC

## 2021-02-19 MED ORDER — LACTATED RINGERS IV SOLN: INTRAVENOUS | Status: DC

## 2021-02-19 MED ORDER — BRIMONIDINE TARTRATE-TIMOLOL 0.2-0.5 % OP SOLN
OPHTHALMIC | Status: DC | PRN
  Administered 2021-02-19: 1 [drp] via OPHTHALMIC

## 2021-02-19 MED ORDER — SIGHTPATH DOSE#1 BSS IO SOLN
INTRAOCULAR | Status: DC | PRN
  Administered 2021-02-19: 09:00:00 79 mL via OPHTHALMIC

## 2021-02-19 SURGICAL SUPPLY — 11 items
CATARACT SUITE SIGHTPATH (MISCELLANEOUS) ×2 IMPLANT
FEE CATARACT SUITE SIGHTPATH (MISCELLANEOUS) ×1 IMPLANT
GLOVE SRG 8 PF TXTR STRL LF DI (GLOVE) ×1 IMPLANT
GLOVE SURG ENC TEXT LTX SZ7.5 (GLOVE) ×2 IMPLANT
GLOVE SURG UNDER POLY LF SZ8 (GLOVE) ×2
LENS IOL TECNIS EYHANCE 22.0 (Intraocular Lens) ×1 IMPLANT
NDL FILTER BLUNT 18X1 1/2 (NEEDLE) ×1 IMPLANT
NEEDLE FILTER BLUNT 18X 1/2SAF (NEEDLE) ×1
NEEDLE FILTER BLUNT 18X1 1/2 (NEEDLE) ×1 IMPLANT
SYR 3ML LL SCALE MARK (SYRINGE) ×2 IMPLANT
WATER STERILE IRR 250ML POUR (IV SOLUTION) ×2 IMPLANT

## 2021-02-19 NOTE — Op Note (Signed)
LOCATION:  [place]   PREOPERATIVE DIAGNOSIS:    Nuclear sclerotic cataract right eye. H25.11   POSTOPERATIVE DIAGNOSIS:  Nuclear sclerotic cataract right eye.     PROCEDURE:  Phacoemusification with posterior chamber intraocular lens placement of the right eye   ULTRASOUND TIME: Procedure(s) with comments: CATARACT EXTRACTION PHACO AND INTRAOCULAR LENS PLACEMENT (IOC) RIGHT (Right) - 9.68 01:21.2  LENS:   Implant Name Type Inv. Item Serial No. Manufacturer Lot No. LRB No. Used Action  LENS IOL TECNIS EYHANCE 22.0 - [id number] Intraocular Lens LENS IOL TECNIS EYHANCE 22.0 [id number] [employer]  Right 1 Implanted         SURGEON:  Wyonia Hough, MD   ANESTHESIA:  Topical with tetracaine drops and 2% Xylocaine jelly, augmented with 1% preservative-free intracameral lidocaine.    COMPLICATIONS:  None.   DESCRIPTION OF PROCEDURE:  The patient was identified in the holding room and transported to the operating room and placed in the supine position under the operating microscope.  The right eye was identified as the operative eye and it was prepped and draped in the usual sterile ophthalmic fashion.   A 1 millimeter clear-corneal paracentesis was made at the 12:00 position.  0.5 ml of preservative-free 1% lidocaine was injected into the anterior chamber. The anterior chamber was filled with Viscoat viscoelastic.  A 2.4 millimeter keratome was used to make a near-clear corneal incision at the 9:00 position.  A curvilinear capsulorrhexis was made with a cystotome and capsulorrhexis forceps.  Balanced salt solution was used to hydrodissect and hydrodelineate the nucleus.   Phacoemulsification was then used in stop and chop fashion to remove the lens nucleus and epinucleus.  The remaining cortex was then removed using the irrigation and aspiration handpiece. Provisc was then placed into the capsular bag to distend it for lens placement.  A lens was then injected into the  capsular bag.  The remaining viscoelastic was aspirated.   Wounds were hydrated with balanced salt solution.  The anterior chamber was inflated to a physiologic pressure with balanced salt solution.  No wound leaks were noted. Cefuroxime 0.1 ml of a 10mg /ml solution was injected into the anterior chamber for a dose of 1 mg of intracameral antibiotic at the completion of the case.   Timolol and Brimonidine drops were applied to the eye.  The patient was taken to the recovery room in stable condition without complications of anesthesia or surgery.   Abri Vacca 02/19/2021, 9:03 AM

## 2021-02-19 NOTE — Anesthesia Postprocedure Evaluation (Signed)
Anesthesia Post Note  Patient: Angela Winters  Procedure(s) Performed: CATARACT EXTRACTION PHACO AND INTRAOCULAR LENS PLACEMENT (IOC) RIGHT (Right: Eye)     Patient location during evaluation: PACU Anesthesia Type: MAC Level of consciousness: awake and alert Pain management: pain level controlled Vital Signs Assessment: post-procedure vital signs reviewed and stable Respiratory status: spontaneous breathing Cardiovascular status: stable Anesthetic complications: no   No notable events documented.  Gillian Scarce

## 2021-02-19 NOTE — H&P (Signed)
[facility]   Primary Care Physician:  Birdie Sons, MD Ophthalmologist: Dr. Leandrew Koyanagi  Pre-Procedure History & Physical: HPI:  Angela Winters is a 71 y.o. female here for ophthalmic surgery.   Past Medical History:  Diagnosis Date   Chronic radicular low back pain    Hypertension    Sciatica of left side    Spinal stenosis at L4-L5 level     Past Surgical History:  Procedure Laterality Date   REPLACEMENT TOTAL KNEE Left 07/24/2013   Dr. Marry Guan, [facility]    Prior to Admission medications   Medication Sig Start Date End Date Taking? Authorizing Provider  aspirin 81 MG tablet Take 81 mg by mouth daily.    Yes [provider]  cholecalciferol (VITAMIN D) 1000 UNITS tablet VITAMIN D, 1000UNIT (Oral Tablet)  1 Every Day for 0 days  Quantity: 0.00;  Refills: 0   Ordered :09-Jan-2010  Darlin Priestly ;  Started 11-Sep-2008 Active 09/11/08  Yes [provider]  citalopram (CELEXA) 40 MG tablet TAKE 1 TABLET BY MOUTH EVERY DAY 06/17/20  Yes Birdie Sons, MD  lisinopril (ZESTRIL) 10 MG tablet Take 1 tablet (10 mg total) by mouth daily. 08/22/20  Yes Birdie Sons, MD  meloxicam (MOBIC) 15 MG tablet TAKE 1 TABLET (15 MG TOTAL) BY MOUTH DAILY. AS NEEDED 11/28/20  Yes Birdie Sons, MD  metoprolol succinate (TOPROL-XL) 50 MG 24 hr tablet TAKE 1 TABLET (50 MG TOTAL) BY MOUTH DAILY. TAKE WITH OR IMMEDIATELY FOLLOWING A MEAL. 11/18/20  Yes Birdie Sons, MD  MULTIPLE VITAMIN PO 1 tablet daily. 09/11/08  Yes [provider]  rosuvastatin (CRESTOR) 40 MG tablet TAKE 1 TABLET BY MOUTH EVERY DAY 12/10/20  Yes Birdie Sons, MD    Allergies as of 01/14/2021 - Review Complete 11/23/2019  Allergen Reaction Noted   Codeine Nausea Only 06/14/2013    Family History  Problem Relation Age of Onset   Cancer Mother        Colon Cancer   Breast cancer Neg Hx     Social History   Socioeconomic History   Marital status: Married    Spouse  name: Not on file   Number of children: 1   Years of education: Not on file   Highest education level: 12th grade  Occupational History   Occupation: retired  Tobacco Use   Smoking status: Every Day    Packs/day: 0.75    Years: 50.00    Pack years: 37.50    Types: Cigarettes   Smokeless tobacco: Never   Tobacco comments:    Started around age 53  Vaping Use   Vaping Use: Never used  Substance and Sexual Activity   Alcohol use: Yes    Alcohol/week: 2.0 - 3.0 standard drinks    Types: 2 - 3 Shots of liquor per week   Drug use: No   Sexual activity: Not on file  Other Topics Concern   Not on file  Social History Narrative   Not on file   Social Determinants of Health   Financial Resource Strain: Not on file  Food Insecurity: Not on file  Transportation Needs: Not on file  Physical Activity: Not on file  Stress: Not on file  Social Connections: Not on file  Intimate Partner Violence: Not on file    Review of Systems: See HPI, otherwise negative ROS  Physical Exam: BP (!) 122/95    Pulse 68    Temp 97.7 F (36.5  C) (Temporal)    Resp 18    Ht 5\' 6"  (1.676 m)    Wt 74.4 kg    SpO2 97%    BMI 26.47 kg/m  General:   Alert,  pleasant and cooperative in NAD Head:  Normocephalic and atraumatic. Lungs:  Clear to auscultation.    Heart:  Regular rate and rhythm.   Impression/Plan: Angela Winters is here for ophthalmic surgery.  Risks, benefits, limitations, and alternatives regarding ophthalmic surgery have been reviewed with the patient.  Questions have been answered.  All parties agreeable.   Leandrew Koyanagi, MD  02/19/2021, 8:11 AM

## 2021-02-19 NOTE — Anesthesia Preprocedure Evaluation (Signed)
Anesthesia Evaluation  Patient identified by MRN, date of birth, ID band Patient awake    Reviewed: Allergy & Precautions, H&P , NPO status , Patient's Chart, lab work & pertinent test results  Airway Mallampati: II  TM Distance: >3 FB Neck ROM: full    Dental no notable dental hx.    Pulmonary COPD, Current Smoker,    Pulmonary exam normal        Cardiovascular hypertension, On Medications Normal cardiovascular exam Rhythm:regular Rate:Normal     Neuro/Psych Anxiety negative neurological ROS     GI/Hepatic negative GI ROS, Neg liver ROS,   Endo/Other  negative endocrine ROS  Renal/GU negative Renal ROS     Musculoskeletal   Abdominal   Peds  Hematology negative hematology ROS (+)   Anesthesia Other Findings   Reproductive/Obstetrics                             Anesthesia Physical Anesthesia Plan  ASA: 2  Anesthesia Plan: MAC   Post-op Pain Management:    Induction:   PONV Risk Score and Plan: 1 and TIVA, Midazolam and Treatment may vary due to age or medical condition  Airway Management Planned:   Additional Equipment:   Intra-op Plan:   Post-operative Plan:   Informed Consent: I have reviewed the patients History and Physical, chart, labs and discussed the procedure including the risks, benefits and alternatives for the proposed anesthesia with the patient or authorized representative who has indicated his/her understanding and acceptance.       Plan Discussed with:   Anesthesia Plan Comments:         Anesthesia Quick Evaluation

## 2021-02-19 NOTE — Transfer of Care (Signed)
Immediate Anesthesia Transfer of Care Note  Patient: Angela Winters  Procedure(s) Performed: CATARACT EXTRACTION PHACO AND INTRAOCULAR LENS PLACEMENT (IOC) RIGHT (Right: Eye)  Patient Location: PACU  Anesthesia Type: MAC  Level of Consciousness: awake, alert  and patient cooperative  Airway and Oxygen Therapy: Patient Spontanous Breathing and Patient connected to supplemental oxygen  Post-op Assessment: Post-op Vital signs reviewed, Patient's Cardiovascular Status Stable, Respiratory Function Stable, Patent Airway and No signs of Nausea or vomiting  Post-op Vital Signs: Reviewed and stable  Complications: No notable events documented.

## 2021-02-20 ENCOUNTER — Encounter: Payer: Self-pay | Admitting: Ophthalmology

## 2021-03-03 NOTE — Discharge Instructions (Signed)

## 2021-03-04 ENCOUNTER — Other Ambulatory Visit: Payer: Self-pay | Admitting: *Deleted

## 2021-03-04 DIAGNOSIS — Z87891 Personal history of nicotine dependence: Secondary | ICD-10-CM

## 2021-03-04 DIAGNOSIS — F17210 Nicotine dependence, cigarettes, uncomplicated: Secondary | ICD-10-CM

## 2021-03-05 ENCOUNTER — Ambulatory Visit: Payer: Medicare PPO | Admitting: Anesthesiology

## 2021-03-05 ENCOUNTER — Ambulatory Visit
Admission: RE | Admit: 2021-03-05 | Discharge: 2021-03-05 | Disposition: A | Discharge status: Home / Self Care | Payer: Medicare PPO | Attending: Ophthalmology | Admitting: Ophthalmology

## 2021-03-05 ENCOUNTER — Encounter: Payer: Self-pay | Admitting: Ophthalmology

## 2021-03-05 ENCOUNTER — Encounter
Admission: RE | Disposition: A | Discharge status: Home / Self Care | Payer: Self-pay | Source: Home / Self Care | Attending: Ophthalmology

## 2021-03-05 ENCOUNTER — Other Ambulatory Visit: Payer: Self-pay

## 2021-03-05 DIAGNOSIS — H2512 Age-related nuclear cataract, left eye: Secondary | ICD-10-CM | POA: Insufficient documentation

## 2021-03-05 DIAGNOSIS — Z79899 Other long term (current) drug therapy: Secondary | ICD-10-CM | POA: Diagnosis not present

## 2021-03-05 DIAGNOSIS — H25812 Combined forms of age-related cataract, left eye: Secondary | ICD-10-CM | POA: Diagnosis not present

## 2021-03-05 DIAGNOSIS — I10 Essential (primary) hypertension: Secondary | ICD-10-CM | POA: Insufficient documentation

## 2021-03-05 DIAGNOSIS — F17210 Nicotine dependence, cigarettes, uncomplicated: Secondary | ICD-10-CM | POA: Diagnosis not present

## 2021-03-05 DIAGNOSIS — J449 Chronic obstructive pulmonary disease, unspecified: Secondary | ICD-10-CM | POA: Diagnosis not present

## 2021-03-05 HISTORY — PX: CATARACT EXTRACTION W/PHACO: SHX586

## 2021-03-05 SURGERY — PHACOEMULSIFICATION, CATARACT, WITH IOL INSERTION
Anesthesia: Monitor Anesthesia Care | Site: Eye | Laterality: Left

## 2021-03-05 MED ORDER — SIGHTPATH DOSE#1 BSS IO SOLN
INTRAOCULAR | Status: DC | PRN
  Administered 2021-03-05: 69 mL via OPHTHALMIC

## 2021-03-05 MED ORDER — ARMC OPHTHALMIC DILATING DROPS
1.0000 "application " | OPHTHALMIC | Status: DC | PRN
  Administered 2021-03-05 (×3): 1 via OPHTHALMIC

## 2021-03-05 MED ORDER — CEFUROXIME OPHTHALMIC INJECTION 1 MG/0.1 ML
INJECTION | OPHTHALMIC | Status: DC | PRN
  Administered 2021-03-05: 0.1 mL via INTRACAMERAL

## 2021-03-05 MED ORDER — MIDAZOLAM HCL 2 MG/2ML IJ SOLN
INTRAMUSCULAR | Status: DC | PRN
  Administered 2021-03-05 (×2): 1 mg via INTRAVENOUS

## 2021-03-05 MED ORDER — BRIMONIDINE TARTRATE-TIMOLOL 0.2-0.5 % OP SOLN
OPHTHALMIC | Status: DC | PRN
  Administered 2021-03-05: 1 [drp] via OPHTHALMIC

## 2021-03-05 MED ORDER — ACETAMINOPHEN 160 MG/5ML PO SOLN: 325.0000 mg | ORAL | Status: DC | PRN

## 2021-03-05 MED ORDER — LACTATED RINGERS IV SOLN: INTRAVENOUS | Status: DC

## 2021-03-05 MED ORDER — TETRACAINE HCL 0.5 % OP SOLN
1.0000 [drp] | OPHTHALMIC | Status: DC | PRN
  Administered 2021-03-05 (×3): 1 [drp] via OPHTHALMIC

## 2021-03-05 MED ORDER — SIGHTPATH DOSE#1 BSS IO SOLN
INTRAOCULAR | Status: DC | PRN
  Administered 2021-03-05: 15 mL

## 2021-03-05 MED ORDER — SIGHTPATH DOSE#1 NA HYALUR & NA CHOND-NA HYALUR IO KIT
PACK | INTRAOCULAR | Status: DC | PRN
  Administered 2021-03-05: 1 via OPHTHALMIC

## 2021-03-05 MED ORDER — FENTANYL CITRATE (PF) 100 MCG/2ML IJ SOLN
INTRAMUSCULAR | Status: DC | PRN
  Administered 2021-03-05: 50 ug via INTRAVENOUS
  Administered 2021-03-05: 25 ug via INTRAVENOUS

## 2021-03-05 MED ORDER — SIGHTPATH DOSE#1 BSS IO SOLN
INTRAOCULAR | Status: DC | PRN
  Administered 2021-03-05: 1 mL via INTRAMUSCULAR

## 2021-03-05 MED ORDER — ACETAMINOPHEN 325 MG PO TABS: 325.0000 mg | ORAL_TABLET | ORAL | Status: DC | PRN

## 2021-03-05 SURGICAL SUPPLY — 11 items
CATARACT SUITE SIGHTPATH (MISCELLANEOUS) ×2 IMPLANT
FEE CATARACT SUITE SIGHTPATH (MISCELLANEOUS) ×1 IMPLANT
GLOVE SRG 8 PF TXTR STRL LF DI (GLOVE) ×1 IMPLANT
GLOVE SURG ENC TEXT LTX SZ7.5 (GLOVE) ×2 IMPLANT
GLOVE SURG UNDER POLY LF SZ8 (GLOVE) ×2
LENS IOL TECNIS EYHANCE 21.5 (Intraocular Lens) ×1 IMPLANT
NDL FILTER BLUNT 18X1 1/2 (NEEDLE) ×1 IMPLANT
NEEDLE FILTER BLUNT 18X 1/2SAF (NEEDLE) ×1
NEEDLE FILTER BLUNT 18X1 1/2 (NEEDLE) ×1 IMPLANT
SYR 3ML LL SCALE MARK (SYRINGE) ×2 IMPLANT
WATER STERILE IRR 250ML POUR (IV SOLUTION) ×2 IMPLANT

## 2021-03-05 NOTE — Anesthesia Postprocedure Evaluation (Signed)
Anesthesia Post Note  Patient: Angela Winters  Procedure(s) Performed: CATARACT EXTRACTION PHACO AND INTRAOCULAR LENS PLACEMENT (IOC) LEFT 6.90 01:10.4 (Left: Eye)     Patient location during evaluation: PACU Anesthesia Type: MAC Level of consciousness: awake and alert Pain management: pain level controlled Vital Signs Assessment: post-procedure vital signs reviewed and stable Respiratory status: spontaneous breathing, nonlabored ventilation, respiratory function stable and patient connected to nasal cannula oxygen Cardiovascular status: stable and blood pressure returned to baseline Postop Assessment: no apparent nausea or vomiting Anesthetic complications: no   No notable events documented.  Trecia Rogers

## 2021-03-05 NOTE — Anesthesia Preprocedure Evaluation (Signed)
Anesthesia Evaluation  Patient identified by MRN, date of birth, ID band Patient awake    Reviewed: Allergy & Precautions, H&P , NPO status , Patient's Chart, lab work & pertinent test results, reviewed documented beta blocker date and time   Airway Mallampati: II  TM Distance: >3 FB Neck ROM: full    Dental no notable dental hx.    Pulmonary COPD, Current Smoker,    Pulmonary exam normal breath sounds clear to auscultation       Cardiovascular Exercise Tolerance: Good hypertension, Normal cardiovascular exam Rhythm:regular Rate:Normal     Neuro/Psych negative neurological ROS  negative psych ROS   GI/Hepatic negative GI ROS, Neg liver ROS,   Endo/Other  negative endocrine ROS  Renal/GU negative Renal ROS  negative genitourinary   Musculoskeletal   Abdominal   Peds  Hematology negative hematology ROS (+)   Anesthesia Other Findings   Reproductive/Obstetrics negative OB ROS                             Anesthesia Physical Anesthesia Plan  ASA: 2  Anesthesia Plan: MAC   Post-op Pain Management:    Induction:   PONV Risk Score and Plan:   Airway Management Planned:   Additional Equipment:   Intra-op Plan:   Post-operative Plan:   Informed Consent: I have reviewed the patients History and Physical, chart, labs and discussed the procedure including the risks, benefits and alternatives for the proposed anesthesia with the patient or authorized representative who has indicated his/her understanding and acceptance.     Dental Advisory Given  Plan Discussed with: CRNA and Anesthesiologist  Anesthesia Plan Comments:         Anesthesia Quick Evaluation

## 2021-03-05 NOTE — H&P (Signed)
[facility]   Primary Care Physician:  Birdie Sons, MD Ophthalmologist: Dr. Leandrew Koyanagi  Pre-Procedure History & Physical: HPI:  Angela Winters is a 71 y.o. female here for ophthalmic surgery.   Past Medical History:  Diagnosis Date   Chronic radicular low back pain    Hypertension    Sciatica of left side    Spinal stenosis at L4-L5 level     Past Surgical History:  Procedure Laterality Date   CATARACT EXTRACTION W/PHACO Right 02/19/2021   Procedure: CATARACT EXTRACTION PHACO AND INTRAOCULAR LENS PLACEMENT ([place]) RIGHT;  Surgeon: Leandrew Koyanagi, MD;  Location: [place];  Service: Ophthalmology;  Laterality: Right;  9.68 01:21.2   REPLACEMENT TOTAL KNEE Left 07/24/2013   Dr. Marry Guan, [facility]    Prior to Admission medications   Medication Sig Start Date End Date Taking? Authorizing Provider  aspirin 81 MG tablet Take 81 mg by mouth daily.    Yes [provider]  cholecalciferol (VITAMIN D) 1000 UNITS tablet VITAMIN D, 1000UNIT (Oral Tablet)  1 Every Day for 0 days  Quantity: 0.00;  Refills: 0   Ordered :09-Jan-2010  Darlin Priestly ;  Started 11-Sep-2008 Active 09/11/08  Yes [provider]  citalopram (CELEXA) 40 MG tablet TAKE 1 TABLET BY MOUTH EVERY DAY 06/17/20  Yes Birdie Sons, MD  lisinopril (ZESTRIL) 10 MG tablet Take 1 tablet (10 mg total) by mouth daily. 08/22/20  Yes Birdie Sons, MD  meloxicam (MOBIC) 15 MG tablet TAKE 1 TABLET (15 MG TOTAL) BY MOUTH DAILY. AS NEEDED 11/28/20  Yes Birdie Sons, MD  metoprolol succinate (TOPROL-XL) 50 MG 24 hr tablet TAKE 1 TABLET (50 MG TOTAL) BY MOUTH DAILY. TAKE WITH OR IMMEDIATELY FOLLOWING A MEAL. 11/18/20  Yes Birdie Sons, MD  MULTIPLE VITAMIN PO 1 tablet daily. 09/11/08  Yes [provider]  rosuvastatin (CRESTOR) 40 MG tablet TAKE 1 TABLET BY MOUTH EVERY DAY 12/10/20  Yes Birdie Sons, MD    Allergies as of 01/14/2021 - Review Complete 11/23/2019   Allergen Reaction Noted   Codeine Nausea Only 06/14/2013    Family History  Problem Relation Age of Onset   Cancer Mother        Colon Cancer   Breast cancer Neg Hx     Social History   Socioeconomic History   Marital status: Married    Spouse name: Not on file   Number of children: 1   Years of education: Not on file   Highest education level: 12th grade  Occupational History   Occupation: retired  Tobacco Use   Smoking status: Every Day    Packs/day: 0.75    Years: 50.00    Pack years: 37.50    Types: Cigarettes   Smokeless tobacco: Never   Tobacco comments:    Started around age 7  Vaping Use   Vaping Use: Never used  Substance and Sexual Activity   Alcohol use: Yes    Alcohol/week: 2.0 - 3.0 standard drinks    Types: 2 - 3 Shots of liquor per week   Drug use: No   Sexual activity: Not on file  Other Topics Concern   Not on file  Social History Narrative   Not on file   Social Determinants of Health   Financial Resource Strain: Not on file  Food Insecurity: Not on file  Transportation Needs: Not on file  Physical Activity: Not on file  Stress: Not on file  Social Connections: Not  on file  Intimate Partner Violence: Not on file    Review of Systems: See HPI, otherwise negative ROS  Physical Exam: BP 116/81    Pulse 79    Temp 97.6 F (36.4 C) (Temporal)    Resp 16    Ht 5\' 6"  (1.676 m)    Wt 74.4 kg    SpO2 97%    BMI 26.47 kg/m  General:   Alert,  pleasant and cooperative in NAD Head:  Normocephalic and atraumatic. Lungs:  Clear to auscultation.    Heart:  Regular rate and rhythm.   Impression/Plan: Angela Winters is here for ophthalmic surgery.  Risks, benefits, limitations, and alternatives regarding ophthalmic surgery have been reviewed with the patient.  Questions have been answered.  All parties agreeable.   Leandrew Koyanagi, MD  03/05/2021, 10:35 AM

## 2021-03-05 NOTE — Transfer of Care (Signed)
Immediate Anesthesia Transfer of Care Note  Patient: Angela Winters  Procedure(s) Performed: CATARACT EXTRACTION PHACO AND INTRAOCULAR LENS PLACEMENT (IOC) LEFT 6.90 01:10.4 (Left: Eye)  Patient Location: PACU  Anesthesia Type: MAC  Level of Consciousness: awake, alert  and patient cooperative  Airway and Oxygen Therapy: Patient Spontanous Breathing and Patient connected to supplemental oxygen  Post-op Assessment: Post-op Vital signs reviewed, Patient's Cardiovascular Status Stable, Respiratory Function Stable, Patent Airway and No signs of Nausea or vomiting  Post-op Vital Signs: Reviewed and stable  Complications: No notable events documented.

## 2021-03-05 NOTE — Op Note (Signed)
OPERATIVE NOTE  Elleni Mozingo [id number] 03/05/2021   PREOPERATIVE DIAGNOSIS:  Nuclear sclerotic cataract left eye. H25.12   POSTOPERATIVE DIAGNOSIS:    Nuclear sclerotic cataract left eye.     PROCEDURE:  Phacoemusification with posterior chamber intraocular lens placement of the left eye  Ultrasound time: Procedure(s): CATARACT EXTRACTION PHACO AND INTRAOCULAR LENS PLACEMENT (IOC) LEFT 6.90 01:10.4 (Left)  LENS:   Implant Name Type Inv. Item Serial No. Manufacturer Lot No. LRB No. Used Action  LENS IOL TECNIS EYHANCE 21.5 - [id number] Intraocular Lens LENS IOL TECNIS EYHANCE 21.5 [id number] [employer]  Left 1 Implanted      SURGEON:  Wyonia Hough, MD   ANESTHESIA:  Topical with tetracaine drops and 2% Xylocaine jelly, augmented with 1% preservative-free intracameral lidocaine.    COMPLICATIONS:  None.   DESCRIPTION OF PROCEDURE:  The patient was identified in the holding room and transported to the operating room and placed in the supine position under the operating microscope.  The left eye was identified as the operative eye and it was prepped and draped in the usual sterile ophthalmic fashion.   A 1 millimeter clear-corneal paracentesis was made at the 1:30 position.  0.5 ml of preservative-free 1% lidocaine was injected into the anterior chamber.  The anterior chamber was filled with Viscoat viscoelastic.  A 2.4 millimeter keratome was used to make a near-clear corneal incision at the 10:30 position.  .  A curvilinear capsulorrhexis was made with a cystotome and capsulorrhexis forceps.  Balanced salt solution was used to hydrodissect and hydrodelineate the nucleus.   Phacoemulsification was then used in stop and chop fashion to remove the lens nucleus and epinucleus.  The remaining cortex was then removed using the irrigation and aspiration handpiece. Provisc was then placed into the capsular bag to distend it for lens placement.  A lens was then injected into the capsular  bag.  The remaining viscoelastic was aspirated.   Wounds were hydrated with balanced salt solution.  The anterior chamber was inflated to a physiologic pressure with balanced salt solution.  No wound leaks were noted. Cefuroxime 0.1 ml of a 10mg /ml solution was injected into the anterior chamber for a dose of 1 mg of intracameral antibiotic at the completion of the case.   Timolol and Brimonidine drops were applied to the eye.  The patient was taken to the recovery room in stable condition without complications of anesthesia or surgery.  Luzelena Heeg 03/05/2021, 11:23 AM

## 2021-03-06 ENCOUNTER — Encounter: Payer: Self-pay | Admitting: Ophthalmology

## 2021-03-14 ENCOUNTER — Other Ambulatory Visit: Payer: Self-pay | Admitting: Family Medicine

## 2021-03-14 DIAGNOSIS — E7800 Pure hypercholesterolemia, unspecified: Secondary | ICD-10-CM

## 2021-03-24 ENCOUNTER — Other Ambulatory Visit: Payer: Self-pay | Admitting: Family Medicine

## 2021-03-24 DIAGNOSIS — E7800 Pure hypercholesterolemia, unspecified: Secondary | ICD-10-CM

## 2021-03-26 ENCOUNTER — Other Ambulatory Visit: Payer: Self-pay

## 2021-03-26 ENCOUNTER — Ambulatory Visit
Admission: RE | Admit: 2021-03-26 | Discharge: 2021-03-26 | Disposition: A | Discharge status: Home / Self Care | Payer: Medicare PPO | Source: Ambulatory Visit | Attending: Acute Care | Admitting: Acute Care

## 2021-03-26 DIAGNOSIS — Z87891 Personal history of nicotine dependence: Secondary | ICD-10-CM | POA: Diagnosis not present

## 2021-03-26 DIAGNOSIS — F17210 Nicotine dependence, cigarettes, uncomplicated: Secondary | ICD-10-CM | POA: Insufficient documentation

## 2021-03-28 ENCOUNTER — Other Ambulatory Visit: Payer: Self-pay

## 2021-03-28 DIAGNOSIS — Z87891 Personal history of nicotine dependence: Secondary | ICD-10-CM

## 2021-03-28 DIAGNOSIS — F17210 Nicotine dependence, cigarettes, uncomplicated: Secondary | ICD-10-CM

## 2021-04-07 ENCOUNTER — Other Ambulatory Visit: Payer: Self-pay

## 2021-04-07 ENCOUNTER — Ambulatory Visit
Admission: RE | Admit: 2021-04-07 | Discharge: 2021-04-07 | Disposition: A | Discharge status: Home / Self Care | Payer: Medicare PPO | Source: Ambulatory Visit | Attending: Family Medicine | Admitting: Family Medicine

## 2021-04-07 DIAGNOSIS — Z1231 Encounter for screening mammogram for malignant neoplasm of breast: Secondary | ICD-10-CM | POA: Insufficient documentation

## 2021-04-19 ENCOUNTER — Telehealth: Payer: Self-pay | Admitting: Family Medicine

## 2021-04-19 ENCOUNTER — Other Ambulatory Visit: Payer: Self-pay | Admitting: Family Medicine

## 2021-04-19 DIAGNOSIS — E7800 Pure hypercholesterolemia, unspecified: Secondary | ICD-10-CM

## 2021-04-20 NOTE — Telephone Encounter (Signed)
This patient has not been since 2021. She needs to schedule office visit before refill can be approved. Can sent 30 day prescription to get by until seen in office.  ?

## 2021-04-21 NOTE — Telephone Encounter (Signed)
Looks like she is scheduled with you until 08/11/2021. Do we need to offer a sooner appointment? ? ?

## 2021-04-21 NOTE — Telephone Encounter (Signed)
Yes, she needs to be seen within the next month.  ?

## 2021-04-21 NOTE — Telephone Encounter (Signed)
Requested Prescriptions  ?Pending Prescriptions Disp Refills  ?? citalopram (CELEXA) 40 MG tablet [Pharmacy Med Name: CITALOPRAM HBR 40 MG TABLET] 90 tablet 0  ?  Sig: TAKE 1 TABLET BY MOUTH EVERY DAY  ?  ? Psychiatry:  Antidepressants - SSRI Failed - 04/21/2021 12:42 PM  ?  ?  Failed - Valid encounter within last 6 months  ?  Recent Outpatient Visits   ?      ? 1 year ago Essential (primary) hypertension  ? [facility] Caryn Section, Kirstie Peri, MD  ? 1 year ago Aortic atherosclerosis [facility])  ? [facility] Caryn Section, Kirstie Peri, MD  ? 2 years ago Annual physical exam  ? [facility] Birdie Sons, MD  ? 3 years ago Hypercholesterolemia without hypertriglyceridemia  ? [facility] Caryn Section, Kirstie Peri, MD  ? 3 years ago Annual physical exam  ? [facility] Birdie Sons, MD  ?  ?  ?Future Appointments   ?        ? In 3 months Fisher, Kirstie Peri, MD [facility]  ?  ? ?  ?  ?  ? ?

## 2021-04-22 NOTE — Telephone Encounter (Signed)
Left vm to schedule OV as stated in previous messages.  Okay for [facility] to schedule if pt calls back.

## 2021-04-23 NOTE — Telephone Encounter (Signed)
It looks like patient has scheduled an appointment for a CPE in July. Its fine that she keep the physical appointment in July, but she needs to schedule a follow up appointment within the next 30 days for medication refills since she has not been seen since 2021. Left message to call back, OK for [facility] triage to advise and scheduled appointment for medication follow up.  ?

## 2021-05-13 ENCOUNTER — Other Ambulatory Visit: Payer: Self-pay | Admitting: Family Medicine

## 2021-05-13 DIAGNOSIS — E7800 Pure hypercholesterolemia, unspecified: Secondary | ICD-10-CM

## 2021-06-10 ENCOUNTER — Other Ambulatory Visit: Payer: Self-pay | Admitting: Family Medicine

## 2021-06-10 DIAGNOSIS — E7800 Pure hypercholesterolemia, unspecified: Secondary | ICD-10-CM

## 2021-07-03 ENCOUNTER — Other Ambulatory Visit: Payer: Self-pay | Admitting: Family Medicine

## 2021-07-03 DIAGNOSIS — E7800 Pure hypercholesterolemia, unspecified: Secondary | ICD-10-CM

## 2021-07-20 ENCOUNTER — Other Ambulatory Visit: Payer: Self-pay | Admitting: Family Medicine

## 2021-07-26 ENCOUNTER — Other Ambulatory Visit: Payer: Self-pay | Admitting: Family Medicine

## 2021-07-26 DIAGNOSIS — E7800 Pure hypercholesterolemia, unspecified: Secondary | ICD-10-CM

## 2021-08-11 ENCOUNTER — Encounter: Payer: Medicare PPO | Admitting: Family Medicine

## 2021-08-21 ENCOUNTER — Other Ambulatory Visit: Payer: Self-pay | Admitting: Family Medicine

## 2021-08-21 DIAGNOSIS — E7800 Pure hypercholesterolemia, unspecified: Secondary | ICD-10-CM

## 2021-09-09 ENCOUNTER — Other Ambulatory Visit: Payer: Self-pay | Admitting: Family Medicine

## 2021-09-09 DIAGNOSIS — I10 Essential (primary) hypertension: Secondary | ICD-10-CM

## 2021-10-03 ENCOUNTER — Other Ambulatory Visit: Payer: Self-pay | Admitting: Family Medicine

## 2021-10-03 DIAGNOSIS — I10 Essential (primary) hypertension: Secondary | ICD-10-CM

## 2021-10-11 ENCOUNTER — Other Ambulatory Visit: Payer: Self-pay | Admitting: Family Medicine

## 2021-10-21 NOTE — Progress Notes (Unsigned)
I,Angela Winters,acting as a scribe for Lelon Huh, MD.,have documented all relevant documentation on the behalf of Lelon Huh, MD,as directed by  Lelon Huh, MD while in the presence of Lelon Huh, MD.    Complete Physical Exam      Patient: Angela Winters, Female    DOB: 24-Feb-1950, 71 y.o.   MRN: [id number] Visit Date: 10/22/2021  Today's Provider: Lelon Huh, MD   No chief complaint on file.  Subjective    Angela Winters is a 71 y.o. female who presents today for her complete physical examination. She reports consuming a general diet. The patient does not participate in regular exercise at present. She generally feels fairly well. She reports sleeping fairly well. She does have additional problems to discuss today.   She is followed by vascular for carotid artery disease and has upcoming appointment. LDCT also revealed 3 V CAD and left main disease. She is taking high intensity statin and denies chest pains , heart flutters, or dyspnea.  HPI Lipid/Cholesterol, Follow-up  Last lipid panel Other pertinent labs  Lab Results  Component Value Date   CHOL 162 11/20/2019   HDL 61 11/20/2019   LDLCALC 74 11/20/2019   TRIG 160 (H) 11/20/2019   CHOLHDL 2.7 11/20/2019   Lab Results  Component Value Date   ALT 20 11/20/2019   AST 17 11/20/2019   PLT 219 11/20/2019   TSH 0.718 08/21/2016     She was last seen for this on 11/20/2019.   Management since that visit includes continue same medications.  She reports good compliance with treatment. She is not having side effects.   Symptoms: No chest pain No chest pressure/discomfort  No dyspnea No lower extremity edema  No numbness or tingling of extremity No orthopnea  No palpitations No paroxysmal nocturnal dyspnea  No speech difficulty No syncope   Current diet: in general, an "unhealthy" diet Current exercise: none  The 10-year ASCVD risk score (Arnett DK, et al., 2019) is:  21.5%  ---------------------------------------------------------------------------------------------------   Hypertension, follow-up  BP Readings from Last 3 Encounters:  10/22/21 126/66  02/19/21 98/71  11/23/19 137/75   Wt Readings from Last 3 Encounters:  10/22/21 166 lb (75.3 kg)  03/26/21 165 lb (74.8 kg)  02/19/21 164 lb (74.4 kg)     She was last seen for hypertension on 11/20/2019.  BP at that visit was 108/80. Management since that visit includes continue same medications.  She reports good compliance with treatment. She is not having side effects.  She is following a Regular diet. She is not exercising. She does smoke.  Use of agents associated with hypertension: NSAIDS.   Outside blood pressures are not checked. Symptoms: No chest pain No chest pressure  No palpitations No syncope  No dyspnea No orthopnea  No paroxysmal nocturnal dyspnea No lower extremity edema      ---------------------------------------------------------------------------------------------------  Prediabetes, Follow-up  Lab Results  Component Value Date   HGBA1C 6.2 (A) 05/30/2019   HGBA1C 6.1 (H) 11/04/2018   HGBA1C 5.9 (H) 01/06/2018   GLUCOSE 116 (H) 11/20/2019   GLUCOSE 125 (H) 11/04/2018   GLUCOSE 104 (H) 01/06/2018    Last seen for for this on 11/19/2021.   Management since that visit includes continuing dietary modifications. Current symptoms include none and have been stable.  Prior visit with dietician: no Current diet: in general, an "unhealthy" diet Current exercise: none  -----------------------------------------------------------------------------------------    Medications: Outpatient Medications Prior to Visit  Medication Sig  aspirin 81 MG tablet Take 81 mg by mouth daily.    cholecalciferol (VITAMIN D) 1000 UNITS tablet VITAMIN D, 1000UNIT (Oral Tablet)  1 Every Day for 0 days  Quantity: 0.00;  Refills: 0   Ordered :09-Jan-2010  Darlin Priestly ;   Started 11-Sep-2008 Active   citalopram (CELEXA) 40 MG tablet Take 1 tablet (40 mg total) by mouth daily. KEEP SCHEDULED VISIT FOR ADDITIONAL REFILLS   lisinopril (ZESTRIL) 10 MG tablet TAKE 1 TABLET BY MOUTH EVERY DAY   meloxicam (MOBIC) 15 MG tablet TAKE 1 TABLET (15 MG TOTAL) BY MOUTH DAILY. AS NEEDED   metoprolol succinate (TOPROL-XL) 50 MG 24 hr tablet TAKE 1 TABLET (50 MG TOTAL) BY MOUTH DAILY. TAKE WITH OR IMMEDIATELY FOLLOWING A MEAL.   MULTIPLE VITAMIN PO 1 tablet daily.   rosuvastatin (CRESTOR) 40 MG tablet TAKE 1 TABLET BY MOUTH EVERY DAY   No facility-administered medications prior to visit.    Allergies  Allergen Reactions   Codeine Nausea Only    Patient Care Team: Birdie Sons, MD as PCP - General (Family Medicine) Anell Barr, OD as Consulting Physician (Optometry)  Review of Systems  Constitutional:  Negative for chills, fatigue and fever.  HENT:  Negative for congestion, ear pain, rhinorrhea, sneezing and sore throat.   Eyes: Negative.  Negative for pain and redness.  Respiratory:  Negative for cough, shortness of breath and wheezing.   Cardiovascular:  Negative for chest pain and leg swelling.  Gastrointestinal:  Positive for abdominal pain. Negative for blood in stool, constipation, diarrhea and nausea.  Endocrine: Negative for polydipsia and polyphagia.  Genitourinary:  Positive for frequency (x2-3 weeks). Negative for dysuria, flank pain, hematuria, pelvic pain, vaginal bleeding and vaginal discharge.       Malodorous urine  Musculoskeletal:  Negative for arthralgias, back pain, gait problem and joint swelling.  Skin:  Negative for rash.  Neurological: Negative.  Negative for dizziness, tremors, seizures, weakness, light-headedness, numbness and headaches.  Hematological:  Negative for adenopathy.  Psychiatric/Behavioral: Negative.  Negative for behavioral problems, confusion and dysphoric mood. The patient is not nervous/anxious and is not hyperactive.          Objective    Vitals: BP 126/66 (BP Location: Right Arm, Patient Position: Sitting, Cuff Size: Normal)   Pulse 75   Temp 98 F (36.7 C) (Oral)   Resp 14   Ht '5\' 6"'$  (1.676 m)   Wt 166 lb (75.3 kg)   SpO2 100%   BMI 26.79 kg/m    Physical Exam  General Appearance:    Well developed, well nourished female. Alert, cooperative, in no acute distress, appears stated age  Head:    Normocephalic, without obvious abnormality, atraumatic  Eyes:    PERRL, conjunctiva/corneas clear, EOM's intact, fundi    benign, both eyes       Ears:    Normal TM's and external ear canals, both ears  Nose:   Nares normal, septum midline, mucosa normal, no drainage   or sinus tenderness  Throat:   Lips, mucosa, and tongue normal; teeth and gums normal  Neck:   Supple, symmetrical, trachea midline, no adenopathy;       thyroid:  No enlargement/tenderness/nodules; bilateral carotid bruits.   Back:     Symmetric, no curvature, ROM normal, no CVA tenderness  Lungs:     Clear to auscultation bilaterally, respirations unlabored  Chest wall:    No tenderness or deformity  Heart:    Normal heart rate. Normal  rhythm.  3/6 systolic murmur at right upper sternal border S1 and S2 normal  Abdomen:     Soft, non-tender, bowel sounds active all four quadrants,    no masses, no organomegaly. Abdominal bruit present.   Genitalia:    deferred  Rectal:    deferred  Extremities:   All extremities are intact. No cyanosis or edema  Pulses:   2+ and symmetric all extremities  Skin:   Skin color, texture, turgor normal, no rashes or lesions  Lymph nodes:   Cervical, supraclavicular, and axillary nodes normal  Neurologic:   CNII-XII intact. Normal strength, sensation and reflexes      throughout     Results for orders placed or performed in visit on 10/22/21  POCT Urinalysis Dipstick  Result Value Ref Range   Color, UA yellow    Clarity, UA cloudy    Glucose, UA Negative Negative   Bilirubin, UA small     Ketones, UA negative    Spec Grav, UA 1.020 1.010 - 1.025   Blood, UA moderate (hemolyzed)    pH, UA 6.0 5.0 - 8.0   Protein, UA Positive (A) Negative   Urobilinogen, UA 0.2 0.2 or 1.0 E.U./dL   Nitrite, UA positive    Leukocytes, UA Moderate (2+) (A) Negative   Appearance     Odor      EKG - Nonspecific T wave abnormality.   Assessment & Plan     1. Annual physical exam   2. Urinary frequency  - Urine Culture  3. Acute cystitis with hematuria  - Urine Culture - cephALEXin (KEFLEX) 500 MG capsule; Take 1 capsule (500 mg total) by mouth 2 (two) times daily for 5 days.  Dispense: 10 capsule; Refill: 0  4. Pre-diabetes  - Hemoglobin A1c  5. Essential (primary) hypertension Fairly well controlled. Continue current medications.   - Magnesium - EKG 12-Lead  6. Hypercholesterolemia without hypertriglyceridemia She is tolerating rosuvastatin well with no adverse effects.   - CBC - Comprehensive metabolic panel - Lipid panel  7. Smoking greater than 30 pack years Discussed that it is very important to stop smoking and discussed medication to facility smoking cessation. She is not ready to quit, but did agree to take printed prescription for  Varenicline Tartrate, Starter, (CHANTIX STARTING MONTH PAK) 0.5 MG X 11 & 1 MG X 42 TBPK; Take one 0.5 mg tablet daily for 3 days, then take one 0.5 mg tablet twice daily for 4 days, then take one 1 mg tablet twice daily  Dispense: 53 each; Refill: 1, and will consider filling when ready to quit.   8. Aortic atherosclerosis ([facility])  - US AORTA; Future  9. Abdominal bruit  - US AORTA; Future  10. Heart murmur, systolic    11. Coronary artery disease Asymptomatic but high risk for future cardiac event. Reinforced importance of smoking cessation an continue high intensity statin therapy.  Refer cardiology for further evaluation.     The entirety of the information documented in the History of Present Illness, Review of Systems and  Physical Exam were personally obtained by me. Portions of this information were initially documented by the CMA and reviewed by me for thoroughness and accuracy.     Lelon Huh, MD  [facility] [phone] (phone) [phone] (fax)  [place]

## 2021-10-22 ENCOUNTER — Other Ambulatory Visit: Payer: Self-pay | Admitting: Family Medicine

## 2021-10-22 ENCOUNTER — Other Ambulatory Visit (INDEPENDENT_AMBULATORY_CARE_PROVIDER_SITE_OTHER): Payer: Self-pay | Admitting: Nurse Practitioner

## 2021-10-22 ENCOUNTER — Encounter: Payer: Self-pay | Admitting: Family Medicine

## 2021-10-22 ENCOUNTER — Ambulatory Visit (INDEPENDENT_AMBULATORY_CARE_PROVIDER_SITE_OTHER): Payer: Medicare PPO | Admitting: Family Medicine

## 2021-10-22 VITALS — BP 126/66 | HR 75 | Temp 98.0°F | Resp 14 | Ht 66.0 in | Wt 166.0 lb

## 2021-10-22 DIAGNOSIS — R350 Frequency of micturition: Secondary | ICD-10-CM | POA: Diagnosis not present

## 2021-10-22 DIAGNOSIS — R7303 Prediabetes: Secondary | ICD-10-CM | POA: Diagnosis not present

## 2021-10-22 DIAGNOSIS — Z23 Encounter for immunization: Secondary | ICD-10-CM | POA: Diagnosis not present

## 2021-10-22 DIAGNOSIS — I10 Essential (primary) hypertension: Secondary | ICD-10-CM

## 2021-10-22 DIAGNOSIS — I700 Atherosclerosis of aorta: Secondary | ICD-10-CM | POA: Diagnosis not present

## 2021-10-22 DIAGNOSIS — E7800 Pure hypercholesterolemia, unspecified: Secondary | ICD-10-CM

## 2021-10-22 DIAGNOSIS — Z0000 Encounter for general adult medical examination without abnormal findings: Secondary | ICD-10-CM | POA: Diagnosis not present

## 2021-10-22 DIAGNOSIS — I6523 Occlusion and stenosis of bilateral carotid arteries: Secondary | ICD-10-CM

## 2021-10-22 DIAGNOSIS — I2510 Atherosclerotic heart disease of native coronary artery without angina pectoris: Secondary | ICD-10-CM

## 2021-10-22 DIAGNOSIS — R011 Cardiac murmur, unspecified: Secondary | ICD-10-CM

## 2021-10-22 DIAGNOSIS — N3001 Acute cystitis with hematuria: Secondary | ICD-10-CM

## 2021-10-22 DIAGNOSIS — F17210 Nicotine dependence, cigarettes, uncomplicated: Secondary | ICD-10-CM | POA: Diagnosis not present

## 2021-10-22 DIAGNOSIS — R0989 Other specified symptoms and signs involving the circulatory and respiratory systems: Secondary | ICD-10-CM

## 2021-10-22 LAB — POCT URINALYSIS DIPSTICK
Glucose, UA: NEGATIVE
Ketones, UA: NEGATIVE
Nitrite, UA: POSITIVE
Protein, UA: POSITIVE — AB
Spec Grav, UA: 1.02 (ref 1.010–1.025)
Urobilinogen, UA: 0.2 E.U./dL
pH, UA: 6 (ref 5.0–8.0)

## 2021-10-22 MED ORDER — CEPHALEXIN 500 MG PO CAPS: 500.0000 mg | ORAL_CAPSULE | Freq: Two times a day (BID) | ORAL | 0 refills | Status: AC

## 2021-10-22 MED ORDER — VARENICLINE TARTRATE (STARTER) 0.5 MG X 11 & 1 MG X 42 PO TBPK: ORAL_TABLET | ORAL | 1 refills | Status: AC

## 2021-10-22 NOTE — Telephone Encounter (Signed)
Requested Prescriptions  Pending Prescriptions Disp Refills  . rosuvastatin (CRESTOR) 40 MG tablet [Pharmacy Med Name: ROSUVASTATIN CALCIUM 40 MG TAB] 90 tablet 0    Sig: TAKE 1 TABLET BY MOUTH EVERY DAY     Cardiovascular:  Antilipid - Statins 2 Failed - 10/22/2021 11:32 AM      Failed - Cr in normal range and within 360 days    Creatinine  Date Value Ref Range Status  07/26/2013 0.60 0.60 - 1.30 mg/dL Final   Creatinine, Ser  Date Value Ref Range Status  11/20/2019 0.78 0.57 - 1.00 mg/dL Final         Failed - Lipid Panel in normal range within the last 12 months    Cholesterol, Total  Date Value Ref Range Status  11/20/2019 162 100 - 199 mg/dL Final   LDL Chol Calc (NIH)  Date Value Ref Range Status  11/20/2019 74 0 - 99 mg/dL Final   HDL  Date Value Ref Range Status  11/20/2019 61 >39 mg/dL Final   Triglycerides  Date Value Ref Range Status  11/20/2019 160 (H) 0 - 149 mg/dL Final         Passed - Patient is not pregnant      Passed - Valid encounter within last 12 months    Recent Outpatient Visits          Today Annual physical exam   [facility] Birdie Sons, MD   1 year ago Essential (primary) hypertension   [facility] Birdie Sons, MD   2 years ago Aortic atherosclerosis [facility])   [facility] Birdie Sons, MD   2 years ago Annual physical exam   [facility] Birdie Sons, MD   3 years ago Hypercholesterolemia without hypertriglyceridemia   [facility] Birdie Sons, MD

## 2021-10-22 NOTE — Progress Notes (Signed)
Annual Wellness Visit     Patient: Angela Winters, Female    DOB: 1950/09/02, 71 y.o.   MRN: [id number] Visit Date: 10/22/2021  Today's   Subjective    Angela Winters is a 71 y.o. female who presents today for her Annual Wellness Visit.   Medications: Outpatient Medications Prior to Visit  Medication Sig   aspirin 81 MG tablet Take 81 mg by mouth daily.    cholecalciferol (VITAMIN D) 1000 UNITS tablet VITAMIN D, 1000UNIT (Oral Tablet)  1 Every Day for 0 days  Quantity: 0.00;  Refills: 0   Ordered :09-Jan-2010  Darlin Priestly ;  Started 11-Sep-2008 Active   citalopram (CELEXA) 40 MG tablet Take 1 tablet (40 mg total) by mouth daily. KEEP SCHEDULED VISIT FOR ADDITIONAL REFILLS   lisinopril (ZESTRIL) 10 MG tablet TAKE 1 TABLET BY MOUTH EVERY DAY   meloxicam (MOBIC) 15 MG tablet TAKE 1 TABLET (15 MG TOTAL) BY MOUTH DAILY. AS NEEDED   metoprolol succinate (TOPROL-XL) 50 MG 24 hr tablet TAKE 1 TABLET (50 MG TOTAL) BY MOUTH DAILY. TAKE WITH OR IMMEDIATELY FOLLOWING A MEAL.   MULTIPLE VITAMIN PO 1 tablet daily.   rosuvastatin (CRESTOR) 40 MG tablet TAKE 1 TABLET BY MOUTH EVERY DAY   No facility-administered medications prior to visit.    Allergies  Allergen Reactions   Codeine Nausea Only    Patient Care Team: Birdie Sons, MD as PCP - General (Family Medicine) Anell Barr, OD as Consulting Physician (Optometry)      Objective     Most recent functional status assessment:    10/22/2021   10:36 AM  In your present state of health, do you have any difficulty performing the following activities:  Hearing? 0  Vision? 0  Difficulty concentrating or making decisions? 0  Walking or climbing stairs? 0  Dressing or bathing? 0  Doing errands, shopping? 0   Most recent fall risk assessment:    10/22/2021   10:32 AM  Fall Risk   Falls in the past year? 0  Number falls in past yr: 0  Injury with Fall? 0  Risk for fall due to : No Fall Risks  Follow up Falls  evaluation completed    Most recent depression screenings:    10/22/2021   10:33 AM 11/20/2019    8:26 AM  PHQ 2/9 Scores  PHQ - 2 Score 0 0  PHQ- 9 Score 0    Most recent cognitive screening:    11/20/2019    8:33 AM  6CIT Screen  What Year? 0 points  What month? 0 points  What time? 0 points  Count back from 20 0 points  Months in reverse 0 points  Repeat phrase 0 points  Total Score 0 points   Most recent Audit-C alcohol use screening    11/20/2019    8:26 AM  Alcohol Use Disorder Test (AUDIT)  1. How often do you have a drink containing alcohol? 2  2. How many drinks containing alcohol do you have on a typical day when you are drinking? 0  3. How often do you have six or more drinks on one occasion? 0  AUDIT-C Score 2  Alcohol Brief Interventions/Follow-up Referral   A score of 3 or more in women, and 4 or more in men indicates increased risk for alcohol abuse, EXCEPT if all of the points are from question 1   Results for orders placed or performed in visit on 10/22/21  POCT  Urinalysis Dipstick  Result Value Ref Range   Color, UA yellow    Clarity, UA cloudy    Glucose, UA Negative Negative   Bilirubin, UA small    Ketones, UA negative    Spec Grav, UA 1.020 1.010 - 1.025   Blood, UA moderate (hemolyzed)    pH, UA 6.0 5.0 - 8.0   Protein, UA Positive (A) Negative   Urobilinogen, UA 0.2 0.2 or 1.0 E.U./dL   Nitrite, UA positive    Leukocytes, UA Moderate (2+) (A) Negative   Appearance     Odor      Assessment & Plan     Annual wellness visit done today including the all of the following: Reviewed patient's Family Medical History Reviewed and updated list of patient's medical providers Assessment of cognitive impairment was done Assessed patient's functional ability Established a written schedule for health screening services Health Risk Assessent Completed and Reviewed  Exercise Activities and Dietary recommendations  Goals      Increase water  intake     Recommend increasing water intake to 4-6 glasses a day.       Quit Smoking        Immunization History  Administered Date(s) Administered   Fluad Quad(high Dose 65+) 11/04/2018, 11/20/2019   Influenza, High Dose Seasonal PF 11/23/2016, 12/28/2017, 11/22/2020   Influenza,inj,Quad PF,6+ Mos 01/30/2015   PFIZER(Purple Top)SARS-COV-2 Vaccination 03/07/2019, 03/28/2019, 12/12/2019   Pneumococcal Conjugate-13 01/30/2015   Pneumococcal Polysaccharide-23 08/21/2016   Tdap 09/17/2010   Zoster Recombinat (Shingrix) 04/29/2021, 08/07/2021    Health Maintenance  Topic Date Due   COLONOSCOPY (Pts 45-34yr Insurance coverage will need to be confirmed)  07/10/2019   COVID-19 Vaccine (4 - Pfizer series) 02/06/2020   TETANUS/TDAP  09/16/2020   DEXA SCAN  12/07/2020   INFLUENZA VACCINE  08/26/2021   MAMMOGRAM  04/08/2023   Pneumonia Vaccine 72 Years old  Completed   Hepatitis C Screening  Completed   Zoster Vaccines- Shingrix  Completed   HPV VACCINES  Aged Out     Discussed health benefits of physical activity, and encouraged her to engage in regular exercise appropriate for her age and condition.        The entirety of the information documented in the History of Present Illness, Review of Systems and Physical Exam were personally obtained by me. Portions of this information were initially documented by the CMA and reviewed by me for thoroughness and accuracy.     DLelon Huh MD  [facility] [phone](phone) [phone](fax)  [place]

## 2021-10-23 LAB — COMPREHENSIVE METABOLIC PANEL
ALT: 19 IU/L (ref 0–32)
AST: 19 IU/L (ref 0–40)
Albumin/Globulin Ratio: 1.9 (ref 1.2–2.2)
Albumin: 4.6 g/dL (ref 3.8–4.8)
Alkaline Phosphatase: 79 IU/L (ref 44–121)
BUN/Creatinine Ratio: 32 — ABNORMAL HIGH (ref 12–28)
BUN: 21 mg/dL (ref 8–27)
Bilirubin Total: 0.2 mg/dL (ref 0.0–1.2)
CO2: 21 mmol/L (ref 20–29)
Calcium: 9.6 mg/dL (ref 8.7–10.3)
Chloride: 103 mmol/L (ref 96–106)
Creatinine, Ser: 0.66 mg/dL (ref 0.57–1.00)
Globulin, Total: 2.4 g/dL (ref 1.5–4.5)
Glucose: 113 mg/dL — ABNORMAL HIGH (ref 70–99)
Potassium: 4.5 mmol/L (ref 3.5–5.2)
Sodium: 142 mmol/L (ref 134–144)
Total Protein: 7 g/dL (ref 6.0–8.5)
eGFR: 93 mL/min/{1.73_m2} (ref >59)

## 2021-10-23 LAB — CBC
Hematocrit: 44.3 % (ref 34.0–46.6)
Hemoglobin: 15 g/dL (ref 11.1–15.9)
MCH: 31.3 pg (ref 26.6–33.0)
MCHC: 33.9 g/dL (ref 31.5–35.7)
MCV: 92 fL (ref 79–97)
Platelets: 214 10*3/uL (ref 150–450)
RBC: 4.8 x10E6/uL (ref 3.77–5.28)
RDW: 12.7 % (ref 11.7–15.4)
WBC: 10 10*3/uL (ref 3.4–10.8)

## 2021-10-23 LAB — LIPID PANEL
Chol/HDL Ratio: 2.7 ratio (ref 0.0–4.4)
Cholesterol, Total: 174 mg/dL (ref 100–199)
HDL: 64 mg/dL (ref >39)
LDL Chol Calc (NIH): 84 mg/dL (ref 0–99)
Triglycerides: 151 mg/dL — ABNORMAL HIGH (ref 0–149)
VLDL Cholesterol Cal: 26 mg/dL (ref 5–40)

## 2021-10-23 LAB — MAGNESIUM: Magnesium: 1.9 mg/dL (ref 1.6–2.3)

## 2021-10-23 LAB — HEMOGLOBIN A1C
Est. average glucose Bld gHb Est-mCnc: 137 mg/dL
Hgb A1c MFr Bld: 6.4 % — ABNORMAL HIGH (ref 4.8–5.6)

## 2021-10-24 LAB — URINE CULTURE

## 2021-10-24 NOTE — Progress Notes (Signed)
MRN : [id number]  Angela Winters is a 71 y.o. (29-Jul-1950) female who presents with chief complaint of check carotid arteries.  History of Present Illness:   The patient is seen for evaluation of diminished pulses in the left arm. Patient denies pain in the left arm.  She denies lower extremity claudication system.   The patient denies rest pain or dangling of an extremity off the side of the bed during the night for relief. No open wounds or sores at this time. No prior interventions or surgeries.   No history of back problems or DJD of the lumbar sacral spine but she does suffer from DJD ans is s/p left TKR.    The patient denies rest pain symptoms.  No new ulcers or wounds of the left hand or of the foot.   The patient's blood pressure has been stable and relatively well controlled. The patient denies amaurosis fugax or recent TIA symptoms. There are no recent neurological changes noted. The patient denies history of DVT, PE or superficial thrombophlebitis.    Previus ABI 1.22 on the right and 1.23 on the left.  The patient has triphasic tibial artery waveforms bilaterally with good toe waveforms bilaterally.   Today the patient has a carotid artery duplex which reveals 40 to 59% stenosis noted bilaterally.  The right vertebral artery has antegrade flow while the left vertebral artery demonstrates bidirectional flow.  Left subclavian steal syndrome is again noted  No outpatient medications have been marked as taking for the 10/27/21 encounter (Appointment) with Delana Meyer, Dolores Lory, MD.    Past Medical History:  Diagnosis Date   Chronic radicular low back pain    Hypertension    Sciatica of left side    Spinal stenosis at L4-L5 level     Past Surgical History:  Procedure Laterality Date   CATARACT EXTRACTION W/PHACO Right 02/19/2021   Procedure: CATARACT EXTRACTION PHACO AND INTRAOCULAR LENS PLACEMENT ([place]) RIGHT;  Surgeon: Leandrew Koyanagi, MD;  Location: [place];  Service: Ophthalmology;  Laterality: Right;  9.68 01:21.2   CATARACT EXTRACTION W/PHACO Left 03/05/2021   Procedure: CATARACT EXTRACTION PHACO AND INTRAOCULAR LENS PLACEMENT ([place]) LEFT 6.90 01:10.4;  Surgeon: Leandrew Koyanagi, MD;  Location: [place];  Service: Ophthalmology;  Laterality: Left;   REPLACEMENT TOTAL KNEE Left 07/24/2013   Dr. Marry Guan, [facility]    Social History Social History   Tobacco Use   Smoking status: Every Day    Packs/day: 0.75    Years: 50.00    Total pack years: 37.50    Types: Cigarettes   Smokeless tobacco: Never   Tobacco comments:    Started around age 16  Vaping Use   Vaping Use: Never used  Substance Use Topics   Alcohol use: Yes    Alcohol/week: 2.0 - 3.0 standard drinks of alcohol    Types: 2 - 3 Shots of liquor per week   Drug use: No    Family History Family History  Problem Relation Age of Onset   Cancer Mother        Colon Cancer   Breast cancer Neg Hx     Allergies  Allergen Reactions   Codeine Nausea Only     REVIEW OF SYSTEMS (Negative unless checked)  Constitutional: '[]'$ Weight loss  '[]'$ Fever  '[]'$ Chills Cardiac: '[]'$ Chest pain   '[]'$ Chest pressure   '[]'$ Palpitations   '[]'$ Shortness of breath when laying flat   '[]'$ Shortness of breath with exertion. Vascular:  '[x]'$ Pain in legs with  walking   '[]'$ Pain in legs at rest  '[]'$ History of DVT   '[]'$ Phlebitis   '[]'$ Swelling in legs   '[]'$ Varicose veins   '[]'$ Non-healing ulcers Pulmonary:   '[]'$ Uses home oxygen   '[]'$ Productive cough   '[]'$ Hemoptysis   '[]'$ Wheeze  '[]'$ COPD   '[]'$ Asthma Neurologic:  '[]'$ Dizziness   '[]'$ Seizures   '[]'$ History of stroke   '[]'$ History of TIA  '[]'$ Aphasia   '[]'$ Vissual changes   '[]'$ Weakness or numbness in arm   '[]'$ Weakness or numbness in leg Musculoskeletal:   '[]'$ Joint swelling   '[]'$ Joint pain   '[]'$ Low back pain Hematologic:  '[]'$ Easy bruising  '[]'$ Easy bleeding   '[]'$ Hypercoagulable state   '[]'$ Anemic Gastrointestinal:  '[]'$ Diarrhea   '[]'$ Vomiting  '[]'$ Gastroesophageal reflux/heartburn   '[]'$ Difficulty  swallowing. Genitourinary:  '[]'$ Chronic kidney disease   '[]'$ Difficult urination  '[]'$ Frequent urination   '[]'$ Blood in urine Skin:  '[]'$ Rashes   '[]'$ Ulcers  Psychological:  '[]'$ History of anxiety   '[]'$  History of major depression.  Physical Examination  There were no vitals filed for this visit. There is no height or weight on file to calculate BMI. Gen: WD/WN, NAD Head: [place]/AT, No temporalis wasting.  Ear/Nose/Throat: Hearing grossly intact, nares w/o erythema or drainage Eyes: PER, EOMI, sclera nonicteric.  Neck: Supple, no masses.  No bruit or JVD.  Pulmonary:  Good air movement, no audible wheezing, no use of accessory muscles.  Cardiac: RRR, normal S1, S2, no Murmurs. Vascular:  carotid bruit noted Vessel Right Left  Radial Palpable Trace Palpable  Carotid  Palpable  Palpable  Gastrointestinal: soft, non-distended. No guarding/no peritoneal signs.  Musculoskeletal: M/S 5/5 throughout.  No visible deformity.  Neurologic: CN 2-12 intact. Pain and light touch intact in extremities.  Symmetrical.  Speech is fluent. Motor exam as listed above. Psychiatric: Judgment intact, Mood & affect appropriate for pt's clinical situation. Dermatologic: No rashes or ulcers noted.  No changes consistent with cellulitis.   CBC Lab Results  Component Value Date   WBC 10.0 10/22/2021   HGB 15.0 10/22/2021   HCT 44.3 10/22/2021   MCV 92 10/22/2021   PLT 214 10/22/2021    BMET    Component Value Date/Time   NA 142 10/22/2021 1136   NA 135 (L) 07/26/2013 0530   K 4.5 10/22/2021 1136   K 4.4 07/26/2013 0530   CL 103 10/22/2021 1136   CL 104 07/26/2013 0530   CO2 21 10/22/2021 1136   CO2 25 07/26/2013 0530   GLUCOSE 113 (H) 10/22/2021 1136   GLUCOSE 105 (H) 07/26/2013 0530   BUN 21 10/22/2021 1136   BUN 11 07/26/2013 0530   CREATININE 0.66 10/22/2021 1136   CREATININE 0.60 07/26/2013 0530   CALCIUM 9.6 10/22/2021 1136   CALCIUM 8.1 (L) 07/26/2013 0530   GFRNONAA 77 11/20/2019 1015   GFRNONAA >60  07/26/2013 0530   GFRAA 89 11/20/2019 1015   GFRAA >60 07/26/2013 0530   Estimated Creatinine Clearance: 65.9 mL/min (by C-G formula based on SCr of 0.66 mg/dL).  COAG Lab Results  Component Value Date   INR 0.9 07/12/2013    Radiology No results found.   Assessment/Plan 1. Carotid atherosclerosis, bilateral Recommend:   Given the patient's asymptomatic subcritical stenosis no further invasive testing or surgery at this time.   Duplex ultrasound shows 40 to 59% internal carotid stenosis bilaterally.    There is also noted left subclavian artery stenosis as well however the patient has no signs symptoms of worsening currently.    Continue antiplatelet therapy as prescribed Continue management of CAD,  HTN and Hyperlipidemia Healthy heart diet,  encouraged exercise at least 4 times per week Follow up in 12 months with duplex ultrasound and physical exam    - VAS US CAROTID; Future  2. Aortic atherosclerosis (HCC) Recommend:   I do not find evidence of life style limiting vascular disease. The patient specifically denies life style limitation.   Previous noninvasive studies including ABI's of the legs do not identify critical vascular problems.   The patient should continue walking and begin a more formal exercise program. The patient should continue his antiplatelet therapy and aggressive treatment of the lipid abnormalities.   The patient should begin wearing graduated compression socks 15-20 mmHg strength to control her mild edema.  3. Essential (primary) hypertension Continue antihypertensive medications as already ordered, these medications have been reviewed and there are no changes at this time.   4. Pulmonary emphysema, unspecified emphysema type ([place]) Continue pulmonary medications and aerosols as already ordered, these medications have been reviewed and there are no changes at this time.    5. Hypercholesterolemia without hypertriglyceridemia Continue statin  as ordered and reviewed, no changes at this time   6. Bilateral carotid artery stenosis See #1 - VAS US CAROTID    Hortencia Pilar, MD  10/24/2021 5:55 PM

## 2021-10-27 ENCOUNTER — Encounter (INDEPENDENT_AMBULATORY_CARE_PROVIDER_SITE_OTHER): Payer: Self-pay | Admitting: Vascular Surgery

## 2021-10-27 ENCOUNTER — Ambulatory Visit (INDEPENDENT_AMBULATORY_CARE_PROVIDER_SITE_OTHER): Payer: Medicare PPO

## 2021-10-27 ENCOUNTER — Ambulatory Visit (INDEPENDENT_AMBULATORY_CARE_PROVIDER_SITE_OTHER): Payer: Medicare PPO | Admitting: Vascular Surgery

## 2021-10-27 VITALS — BP 117/69 | HR 81 | Resp 18 | Ht 67.0 in | Wt 167.8 lb

## 2021-10-27 DIAGNOSIS — I700 Atherosclerosis of aorta: Secondary | ICD-10-CM | POA: Diagnosis not present

## 2021-10-27 DIAGNOSIS — J439 Emphysema, unspecified: Secondary | ICD-10-CM

## 2021-10-27 DIAGNOSIS — E7800 Pure hypercholesterolemia, unspecified: Secondary | ICD-10-CM | POA: Diagnosis not present

## 2021-10-27 DIAGNOSIS — I10 Essential (primary) hypertension: Secondary | ICD-10-CM | POA: Diagnosis not present

## 2021-10-27 DIAGNOSIS — I6523 Occlusion and stenosis of bilateral carotid arteries: Secondary | ICD-10-CM

## 2021-10-28 ENCOUNTER — Other Ambulatory Visit: Payer: Self-pay | Admitting: Family Medicine

## 2021-10-28 DIAGNOSIS — I10 Essential (primary) hypertension: Secondary | ICD-10-CM

## 2021-10-31 ENCOUNTER — Ambulatory Visit
Admission: RE | Admit: 2021-10-31 | Discharge: 2021-10-31 | Disposition: A | Discharge status: Home / Self Care | Payer: Medicare PPO | Source: Ambulatory Visit | Attending: Family Medicine | Admitting: Family Medicine

## 2021-10-31 ENCOUNTER — Other Ambulatory Visit: Payer: Self-pay | Admitting: Family Medicine

## 2021-10-31 DIAGNOSIS — R0989 Other specified symptoms and signs involving the circulatory and respiratory systems: Secondary | ICD-10-CM | POA: Insufficient documentation

## 2021-10-31 DIAGNOSIS — I700 Atherosclerosis of aorta: Secondary | ICD-10-CM | POA: Insufficient documentation

## 2021-10-31 DIAGNOSIS — Z0389 Encounter for observation for other suspected diseases and conditions ruled out: Secondary | ICD-10-CM | POA: Diagnosis not present

## 2021-11-02 ENCOUNTER — Encounter (INDEPENDENT_AMBULATORY_CARE_PROVIDER_SITE_OTHER): Payer: Self-pay | Admitting: Vascular Surgery

## 2021-11-02 ENCOUNTER — Other Ambulatory Visit: Payer: Self-pay | Admitting: Family Medicine

## 2021-11-02 MED ORDER — EZETIMIBE 10 MG PO TABS: 10.0000 mg | ORAL_TABLET | Freq: Every day | ORAL | 0 refills | Status: DC

## 2021-11-04 ENCOUNTER — Other Ambulatory Visit: Payer: Self-pay | Admitting: Family Medicine

## 2021-11-17 ENCOUNTER — Encounter (INDEPENDENT_AMBULATORY_CARE_PROVIDER_SITE_OTHER): Payer: Medicare PPO

## 2021-11-17 ENCOUNTER — Ambulatory Visit (INDEPENDENT_AMBULATORY_CARE_PROVIDER_SITE_OTHER): Payer: Medicare PPO | Admitting: Vascular Surgery

## 2021-11-20 ENCOUNTER — Other Ambulatory Visit: Payer: Self-pay | Admitting: Family Medicine

## 2021-11-20 DIAGNOSIS — I10 Essential (primary) hypertension: Secondary | ICD-10-CM

## 2021-11-24 ENCOUNTER — Encounter (INDEPENDENT_AMBULATORY_CARE_PROVIDER_SITE_OTHER): Payer: Self-pay

## 2021-12-07 ENCOUNTER — Other Ambulatory Visit: Payer: Self-pay | Admitting: Family Medicine

## 2021-12-07 DIAGNOSIS — I10 Essential (primary) hypertension: Secondary | ICD-10-CM

## 2022-01-16 ENCOUNTER — Other Ambulatory Visit: Payer: Self-pay | Admitting: Family Medicine

## 2022-01-16 DIAGNOSIS — E7800 Pure hypercholesterolemia, unspecified: Secondary | ICD-10-CM

## 2022-01-16 NOTE — Telephone Encounter (Signed)
Requested Prescriptions  Pending Prescriptions Disp Refills   rosuvastatin (CRESTOR) 40 MG tablet [Pharmacy Med Name: ROSUVASTATIN CALCIUM 40 MG TAB] 90 tablet 2    Sig: TAKE 1 TABLET BY MOUTH EVERY DAY     Cardiovascular:  Antilipid - Statins 2 Failed - 01/16/2022  2:25 AM      Failed - Lipid Panel in normal range within the last 12 months    Cholesterol, Total  Date Value Ref Range Status  10/22/2021 174 100 - 199 mg/dL Final   LDL Chol Calc (NIH)  Date Value Ref Range Status  10/22/2021 84 0 - 99 mg/dL Final   HDL  Date Value Ref Range Status  10/22/2021 64 >39 mg/dL Final   Triglycerides  Date Value Ref Range Status  10/22/2021 151 (H) 0 - 149 mg/dL Final         Passed - Cr in normal range and within 360 days    Creatinine  Date Value Ref Range Status  07/26/2013 0.60 0.60 - 1.30 mg/dL Final   Creatinine, Ser  Date Value Ref Range Status  10/22/2021 0.66 0.57 - 1.00 mg/dL Final         Passed - Patient is not pregnant      Passed - Valid encounter within last 12 months    Recent Outpatient Visits           2 months ago Annual physical exam   [facility] Birdie Sons, MD   2 years ago Essential (primary) hypertension   [facility] Birdie Sons, MD   2 years ago Aortic atherosclerosis [facility])   [facility] Birdie Sons, MD   3 years ago Annual physical exam   [facility] Birdie Sons, MD   4 years ago Hypercholesterolemia without hypertriglyceridemia   [facility] Birdie Sons, MD

## 2022-01-29 ENCOUNTER — Other Ambulatory Visit: Payer: Self-pay | Admitting: Family Medicine

## 2022-02-20 ENCOUNTER — Other Ambulatory Visit: Payer: Self-pay | Admitting: Family Medicine

## 2022-02-20 DIAGNOSIS — I10 Essential (primary) hypertension: Secondary | ICD-10-CM

## 2022-03-06 ENCOUNTER — Other Ambulatory Visit: Payer: Self-pay | Admitting: Family Medicine

## 2022-03-06 DIAGNOSIS — I10 Essential (primary) hypertension: Secondary | ICD-10-CM

## 2022-03-10 ENCOUNTER — Other Ambulatory Visit: Payer: Self-pay | Admitting: Family Medicine

## 2022-03-10 DIAGNOSIS — Z1231 Encounter for screening mammogram for malignant neoplasm of breast: Secondary | ICD-10-CM

## 2022-03-25 ENCOUNTER — Other Ambulatory Visit: Payer: Self-pay

## 2022-03-25 DIAGNOSIS — Z87891 Personal history of nicotine dependence: Secondary | ICD-10-CM

## 2022-03-25 DIAGNOSIS — F17210 Nicotine dependence, cigarettes, uncomplicated: Secondary | ICD-10-CM

## 2022-03-27 ENCOUNTER — Ambulatory Visit: Admission: RE | Admit: 2022-03-27 | Payer: Medicare PPO | Source: Ambulatory Visit

## 2022-03-31 ENCOUNTER — Other Ambulatory Visit: Payer: Self-pay | Admitting: Family Medicine

## 2022-03-31 DIAGNOSIS — I10 Essential (primary) hypertension: Secondary | ICD-10-CM

## 2022-03-31 NOTE — Telephone Encounter (Signed)
90 day courtesy refill  Requested Prescriptions  Pending Prescriptions Disp Refills   metoprolol succinate (TOPROL-XL) 50 MG 24 hr tablet [Pharmacy Med Name: METOPROLOL SUCC ER 50 MG TAB] 90 tablet 0    Sig: TAKE 1 TABLET BY MOUTH EVERY DAY WITH OR IMMEDIATELY FOLLOWING A MEAL     Cardiovascular:  Beta Blockers Passed - 03/31/2022  8:32 AM      Passed - Last BP in normal range    BP Readings from Last 1 Encounters:  10/27/21 117/69         Passed - Last Heart Rate in normal range    Pulse Readings from Last 1 Encounters:  10/27/21 81         Passed - Valid encounter within last 6 months    Recent Outpatient Visits           5 months ago Annual physical exam   [place] Birdie Sons, MD   2 years ago Essential (primary) hypertension   [place], Donald E, MD   2 years ago Aortic atherosclerosis [facility])   [place] Birdie Sons, MD   3 years ago Annual physical exam   [facility] Birdie Sons, MD   4 years ago Hypercholesterolemia without hypertriglyceridemia   [place], MD       Future Appointments             In 3 weeks Fisher, Kirstie Peri, MD [facility]

## 2022-04-13 ENCOUNTER — Ambulatory Visit
Admission: RE | Admit: 2022-04-13 | Discharge: 2022-04-13 | Disposition: A | Discharge status: Home / Self Care | Payer: Medicare PPO | Source: Ambulatory Visit | Attending: Family Medicine | Admitting: Family Medicine

## 2022-04-13 DIAGNOSIS — Z1231 Encounter for screening mammogram for malignant neoplasm of breast: Secondary | ICD-10-CM | POA: Diagnosis not present

## 2022-04-15 ENCOUNTER — Ambulatory Visit
Admission: RE | Admit: 2022-04-15 | Discharge: 2022-04-15 | Disposition: A | Discharge status: Home / Self Care | Payer: Medicare PPO | Source: Ambulatory Visit | Attending: Acute Care | Admitting: Acute Care

## 2022-04-15 DIAGNOSIS — F17210 Nicotine dependence, cigarettes, uncomplicated: Secondary | ICD-10-CM

## 2022-04-15 DIAGNOSIS — Z87891 Personal history of nicotine dependence: Secondary | ICD-10-CM

## 2022-04-17 ENCOUNTER — Telehealth: Payer: Self-pay | Admitting: Acute Care

## 2022-04-17 DIAGNOSIS — Z87891 Personal history of nicotine dependence: Secondary | ICD-10-CM

## 2022-04-17 DIAGNOSIS — R911 Solitary pulmonary nodule: Secondary | ICD-10-CM

## 2022-04-17 NOTE — Telephone Encounter (Signed)
I have called the patient with the results of her low dose Ct Chest. I explained her scan was read as a Lung RADS 4 A : suspicious findings, either short term follow up in 3 months or alternatively  PET Scan evaluation may be considered when there is a solid component of  8 mm or larger.  There is a new  New nodule of the left mainstem bronchus measuring 4 mm , possibly due to adherent debris.We will do a 3 month follow up scan to re-evaluate. This will be due 06/2022/  I also spoke with her about the finding of severe coronary artery calcifications and also about the notation of Aortic Atherosclerosis . She has a family histpry of heart disease. She is on a statin, but not followed by cardiology.She is going to talk with her PCP about a cardiology referral when she sees him 04/21/2022.  Langley Gauss, 3 month follow up, please fax results to PCP and let him know plan for follow up scanning. Thanks so much

## 2022-04-20 NOTE — Progress Notes (Unsigned)
I,Sha'taria Tyson,acting as a Education administrator for Lelon Huh, MD.,have documented all relevant documentation on the behalf of Lelon Huh, MD,as directed by  Lelon Huh, MD while in the presence of Lelon Huh, MD.   Established patient visit   Patient: Angela Winters   DOB: 1950-05-12   72 y.o. Female  MRN: [id number] Visit Date: 04/21/2022  Today's healthcare provider: Lelon Huh, MD   No chief complaint on file.  Subjective    HPI  Hypertension, follow-up  BP Readings from Last 3 Encounters:  10/27/21 117/69  10/22/21 126/66  02/19/21 98/71   Wt Readings from Last 3 Encounters:  10/27/21 167 lb 12.8 oz (76.1 kg)  10/22/21 166 lb (75.3 kg)  03/26/21 165 lb (74.8 kg)     She was last seen for hypertension {NUMBERS 1-12:18279} {days/wks/mos/yrs:310907} ago.  BP at that visit was ***. Management since that visit includes ***.  She reports {excellent/good/fair/poor:19665} compliance with treatment. She {is/is not:9024} having side effects. {document side effects if present:1} She is following a {diet:21022986} diet. She {is/is not:9024} exercising. She {does/does not:200015} smoke.  Use of agents associated with hypertension: {bp agents assoc with hypertension:511::"none"}.   Outside blood pressures are {***enter patient reported home BP readings, or 'not being checked':1}. Symptoms: {Yes/No:20286} chest pain {Yes/No:20286} chest pressure  {Yes/No:20286} palpitations {Yes/No:20286} syncope  {Yes/No:20286} dyspnea {Yes/No:20286} orthopnea  {Yes/No:20286} paroxysmal nocturnal dyspnea {Yes/No:20286} lower extremity edema   Pertinent labs Lab Results  Component Value Date   CHOL 174 10/22/2021   HDL 64 10/22/2021   LDLCALC 84 10/22/2021   TRIG 151 (H) 10/22/2021   CHOLHDL 2.7 10/22/2021   Lab Results  Component Value Date   NA 142 10/22/2021   K 4.5 10/22/2021   CREATININE 0.66 10/22/2021   EGFR 93 10/22/2021   GLUCOSE 113 (H) 10/22/2021   TSH 0.718  08/21/2016     The 10-year ASCVD risk score (Arnett DK, et al., 2019) is: 19%  ---------------------------------------------------------------------------------------------------   Medications: Outpatient Medications Prior to Visit  Medication Sig   aspirin 81 MG tablet Take 81 mg by mouth daily.    cholecalciferol (VITAMIN D) 1000 UNITS tablet VITAMIN D, 1000UNIT (Oral Tablet)  1 Every Day for 0 days  Quantity: 0.00;  Refills: 0   Ordered :09-Jan-2010  Darlin Priestly ;  Started 11-Sep-2008 Active   citalopram (CELEXA) 40 MG tablet Take 1 tablet (40 mg total) by mouth daily.   ezetimibe (ZETIA) 10 MG tablet TAKE 1 TABLET BY MOUTH EVERY DAY   lisinopril (ZESTRIL) 10 MG tablet TAKE 1 TABLET BY MOUTH EVERY DAY   meloxicam (MOBIC) 15 MG tablet TAKE 1 TABLET (15 MG TOTAL) BY MOUTH DAILY. AS NEEDED   metoprolol succinate (TOPROL-XL) 50 MG 24 hr tablet TAKE 1 TABLET BY MOUTH EVERY DAY WITH OR IMMEDIATELY FOLLOWING A MEAL   MULTIPLE VITAMIN PO 1 tablet daily.   rosuvastatin (CRESTOR) 40 MG tablet TAKE 1 TABLET BY MOUTH EVERY DAY   No facility-administered medications prior to visit.    Review of Systems  {Labs  Heme  Chem  Endocrine  Serology  Results Review (optional):23779}   Objective    There were no vitals taken for this visit. {Show previous vital signs (optional):23777}  Physical Exam  ***  No results found for any visits on 04/21/22.  Assessment & Plan     ***  No follow-ups on file.      {provider attestation***:1}   Lelon Huh, MD  [place] [phone] (phone)  [phone] (fax)  [place]

## 2022-04-20 NOTE — Telephone Encounter (Signed)
Results/ plans faxed to PCP. Order placed for 3 mth nodule f/u CT.

## 2022-04-21 ENCOUNTER — Encounter: Payer: Self-pay | Admitting: Family Medicine

## 2022-04-21 ENCOUNTER — Ambulatory Visit: Payer: Medicare PPO | Admitting: Family Medicine

## 2022-04-21 VITALS — BP 142/77 | HR 78 | Ht 67.0 in | Wt 166.6 lb

## 2022-04-21 DIAGNOSIS — E2839 Other primary ovarian failure: Secondary | ICD-10-CM | POA: Diagnosis not present

## 2022-04-21 DIAGNOSIS — R7303 Prediabetes: Secondary | ICD-10-CM

## 2022-04-21 DIAGNOSIS — M8580 Other specified disorders of bone density and structure, unspecified site: Secondary | ICD-10-CM | POA: Diagnosis not present

## 2022-04-21 DIAGNOSIS — I6523 Occlusion and stenosis of bilateral carotid arteries: Secondary | ICD-10-CM | POA: Diagnosis not present

## 2022-04-21 DIAGNOSIS — J439 Emphysema, unspecified: Secondary | ICD-10-CM

## 2022-04-21 DIAGNOSIS — I700 Atherosclerosis of aorta: Secondary | ICD-10-CM | POA: Diagnosis not present

## 2022-04-21 DIAGNOSIS — I10 Essential (primary) hypertension: Secondary | ICD-10-CM

## 2022-04-21 DIAGNOSIS — E7800 Pure hypercholesterolemia, unspecified: Secondary | ICD-10-CM | POA: Diagnosis not present

## 2022-04-21 DIAGNOSIS — F17210 Nicotine dependence, cigarettes, uncomplicated: Secondary | ICD-10-CM

## 2022-04-21 DIAGNOSIS — R911 Solitary pulmonary nodule: Secondary | ICD-10-CM

## 2022-04-21 DIAGNOSIS — I2510 Atherosclerotic heart disease of native coronary artery without angina pectoris: Secondary | ICD-10-CM | POA: Diagnosis not present

## 2022-04-21 NOTE — Patient Instructions (Signed)
.   Please review the attached list of medications and notify my office if there are any errors.   . Please bring all of your medications to every appointment so we can make sure that our medication list is the same as yours.   

## 2022-04-22 LAB — CBC
Hematocrit: 45.3 % (ref 34.0–46.6)
Hemoglobin: 15.3 g/dL (ref 11.1–15.9)
MCH: 32 pg (ref 26.6–33.0)
MCHC: 33.8 g/dL (ref 31.5–35.7)
MCV: 95 fL (ref 79–97)
Platelets: 202 10*3/uL (ref 150–450)
RBC: 4.78 x10E6/uL (ref 3.77–5.28)
RDW: 12.9 % (ref 11.7–15.4)
WBC: 10.7 10*3/uL (ref 3.4–10.8)

## 2022-04-22 LAB — COMPREHENSIVE METABOLIC PANEL
ALT: 69 IU/L — ABNORMAL HIGH (ref 0–32)
AST: 49 IU/L — ABNORMAL HIGH (ref 0–40)
Albumin/Globulin Ratio: 1.8 (ref 1.2–2.2)
Albumin: 4.4 g/dL (ref 3.8–4.8)
Alkaline Phosphatase: 80 IU/L (ref 44–121)
BUN/Creatinine Ratio: 15 (ref 12–28)
BUN: 12 mg/dL (ref 8–27)
Bilirubin Total: 0.3 mg/dL (ref 0.0–1.2)
CO2: 23 mmol/L (ref 20–29)
Calcium: 9.7 mg/dL (ref 8.7–10.3)
Chloride: 107 mmol/L — ABNORMAL HIGH (ref 96–106)
Creatinine, Ser: 0.82 mg/dL (ref 0.57–1.00)
Globulin, Total: 2.4 g/dL (ref 1.5–4.5)
Glucose: 157 mg/dL — ABNORMAL HIGH (ref 70–99)
Potassium: 4 mmol/L (ref 3.5–5.2)
Sodium: 149 mmol/L — ABNORMAL HIGH (ref 134–144)
Total Protein: 6.8 g/dL (ref 6.0–8.5)
eGFR: 76 mL/min/{1.73_m2} (ref >59)

## 2022-04-22 LAB — LIPID PANEL
Chol/HDL Ratio: 2.1 ratio (ref 0.0–4.4)
Cholesterol, Total: 133 mg/dL (ref 100–199)
HDL: 62 mg/dL (ref >39)
LDL Chol Calc (NIH): 48 mg/dL (ref 0–99)
Triglycerides: 133 mg/dL (ref 0–149)
VLDL Cholesterol Cal: 23 mg/dL (ref 5–40)

## 2022-04-22 LAB — LDL CHOLESTEROL, DIRECT: LDL Direct: 39 mg/dL (ref 0–99)

## 2022-04-22 LAB — HEMOGLOBIN A1C
Est. average glucose Bld gHb Est-mCnc: 146 mg/dL
Hgb A1c MFr Bld: 6.7 % — ABNORMAL HIGH (ref 4.8–5.6)

## 2022-04-27 ENCOUNTER — Other Ambulatory Visit: Payer: Self-pay | Admitting: Family Medicine

## 2022-05-14 ENCOUNTER — Other Ambulatory Visit: Payer: Self-pay | Admitting: Family Medicine

## 2022-05-22 ENCOUNTER — Other Ambulatory Visit: Payer: Self-pay | Admitting: Family Medicine

## 2022-05-22 DIAGNOSIS — I10 Essential (primary) hypertension: Secondary | ICD-10-CM

## 2022-06-26 ENCOUNTER — Ambulatory Visit: Payer: Medicare PPO | Attending: Cardiology | Admitting: Cardiology

## 2022-06-26 ENCOUNTER — Encounter: Payer: Self-pay | Admitting: Cardiology

## 2022-06-26 VITALS — BP 92/69 | HR 87 | Ht 66.0 in | Wt 161.8 lb

## 2022-06-26 DIAGNOSIS — I10 Essential (primary) hypertension: Secondary | ICD-10-CM

## 2022-06-26 DIAGNOSIS — E785 Hyperlipidemia, unspecified: Secondary | ICD-10-CM

## 2022-06-26 DIAGNOSIS — I2510 Atherosclerotic heart disease of native coronary artery without angina pectoris: Secondary | ICD-10-CM

## 2022-06-26 DIAGNOSIS — F17200 Nicotine dependence, unspecified, uncomplicated: Secondary | ICD-10-CM

## 2022-06-26 MED ORDER — LISINOPRIL 5 MG PO TABS: 5.0000 mg | ORAL_TABLET | Freq: Every day | ORAL | 1 refills | Status: DC

## 2022-06-26 NOTE — Progress Notes (Signed)
Cardiology Office Note:    Date:  06/26/2022   ID:  [name], DOB 1950-12-02, MRN [id number]  PCP:  Malva Limes, MD   [facility] Providers Cardiologist:  Debbe Odea, MD     Referring MD: Malva Limes, MD   Chief Complaint  Patient presents with   New Patient (Initial Visit)    Evaluation for Aortic atherosclerosis seen on recent CT with no cardiac history.      History of Present Illness:    Angela Winters is a 72 y.o. female with a hx of hypertension, current smoker x 50+ years who presents due to aortic atherosclerosis and coronary artery calcifications.  Patient had a chest CT 03/2022 for lung cancer screening, aortic atherosclerosis and three-vessel coronary calcification noted.  She denies chest pain or shortness of breath.  States having dizziness sometimes when standing up or getting out of her car.  She still smokes, no history of heart disease.  Past Medical History:  Diagnosis Date   Chronic radicular low back pain    Hyperlipidemia    Hypertension    Sciatica of left side    Spinal stenosis at L4-L5 level     Past Surgical History:  Procedure Laterality Date   CATARACT EXTRACTION W/PHACO Right 02/19/2021   Procedure: CATARACT EXTRACTION PHACO AND INTRAOCULAR LENS PLACEMENT (IOC) RIGHT;  Surgeon: Lockie Mola, MD;  Location: [facility];  Service: Ophthalmology;  Laterality: Right;  9.68 01:21.2   CATARACT EXTRACTION W/PHACO Left 03/05/2021   Procedure: CATARACT EXTRACTION PHACO AND INTRAOCULAR LENS PLACEMENT (IOC) LEFT 6.90 01:10.4;  Surgeon: Lockie Mola, MD;  Location: [facility];  Service: Ophthalmology;  Laterality: Left;   REPLACEMENT TOTAL KNEE Left 07/24/2013   Dr. Ernest Pine, [facility]    Current Medications: Current Meds  Medication Sig   aspirin 81 MG tablet Take 81 mg by mouth daily.    cholecalciferol (VITAMIN D) 1000 UNITS tablet VITAMIN D, 1000UNIT (Oral Tablet)  1 Every Day for 0 days   Quantity: 0.00;  Refills: 0   Ordered :09-Jan-2010  Burnell Blanks ;  Started 11-Sep-2008 Active   citalopram (CELEXA) 40 MG tablet TAKE 1 TABLET BY MOUTH EVERY DAY   ezetimibe (ZETIA) 10 MG tablet TAKE 1 TABLET BY MOUTH EVERY DAY   lisinopril (ZESTRIL) 5 MG tablet Take 1 tablet (5 mg total) by mouth daily.   meloxicam (MOBIC) 15 MG tablet TAKE 1 TABLET (15 MG TOTAL) BY MOUTH DAILY. AS NEEDED   MULTIPLE VITAMIN PO 1 tablet daily.   rosuvastatin (CRESTOR) 40 MG tablet TAKE 1 TABLET BY MOUTH EVERY DAY   [DISCONTINUED] lisinopril (ZESTRIL) 10 MG tablet TAKE 1 TABLET BY MOUTH EVERY DAY   [DISCONTINUED] metoprolol succinate (TOPROL-XL) 50 MG 24 hr tablet TAKE 1 TABLET BY MOUTH EVERY DAY WITH OR IMMEDIATELY FOLLOWING A MEAL     Allergies:   Codeine   Social History   Socioeconomic History   Marital status: Married    Spouse name: Not on file   Number of children: 1   Years of education: Not on file   Highest education level: 12th grade  Occupational History   Occupation: retired  Tobacco Use   Smoking status: Every Day    Packs/day: 0.75    Years: 50.00    Additional pack years: 0.00    Total pack years: 37.50    Types: Cigarettes   Smokeless tobacco: Never   Tobacco comments:    Started around age 72  Vaping Use  Vaping Use: Never used  Substance and Sexual Activity   Alcohol use: Yes    Alcohol/week: 2.0 - 3.0 standard drinks of alcohol    Types: 2 - 3 Shots of liquor per week    Comment: occasional   Drug use: No   Sexual activity: Not on file  Other Topics Concern   Not on file  Social History Narrative   Not on file   Social Determinants of Health   Financial Resource Strain: Low Risk  (04/17/2022)   Overall Financial Resource Strain (CARDIA)    Difficulty of Paying Living Expenses: Not hard at all  Food Insecurity: No Food Insecurity (04/17/2022)   Hunger Vital Sign    Worried About Running Out of Food in the Last Year: Never true    Ran Out of Food in the Last  Year: Never true  Transportation Needs: No Transportation Needs (04/17/2022)   PRAPARE - Administrator, Civil Service (Medical): No    Lack of Transportation (Non-Medical): No  Physical Activity: Unknown (04/17/2022)   Exercise Vital Sign    Days of Exercise per Week: 0 days    Minutes of Exercise per Session: Not on file  Stress: No Stress Concern Present (04/17/2022)   [employer] of Occupational Health - Occupational Stress Questionnaire    Feeling of Stress : Only a little  Social Connections: Moderately Isolated (04/17/2022)   Social Connection and Isolation Panel [NHANES]    Frequency of Communication with Friends and Family: More than three times a week    Frequency of Social Gatherings with Friends and Family: More than three times a week    Attends Religious Services: Never    Database administrator or Organizations: No    Attends Engineer, structural: Not on file    Marital Status: Married     Family History: The patient's family history includes Cancer in her mother; Heart disease in her father. There is no history of Breast cancer.  ROS:   Please see the history of present illness.     All other systems reviewed and are negative.  EKGs/Labs/Other Studies Reviewed:    The following studies were reviewed today:   EKG:  EKG is  ordered today.  The ekg ordered today demonstrates normal sinus rhythm, heart rate 75  Recent Labs: 10/22/2021: Magnesium 1.9 04/21/2022: ALT 69; BUN 12; Creatinine, Ser 0.82; Hemoglobin 15.3; Platelets 202; Potassium 4.0; Sodium 149  Recent Lipid Panel    Component Value Date/Time   CHOL 133 04/21/2022 1308   TRIG 133 04/21/2022 1308   HDL 62 04/21/2022 1308   CHOLHDL 2.1 04/21/2022 1308   LDLCALC 48 04/21/2022 1308   LDLDIRECT 39 04/21/2022 1308     Risk Assessment/Calculations:               Physical Exam:    VS:  BP 92/69 (BP Location: Left Arm, Patient Position: Sitting, Cuff Size: Normal)   Pulse  87   Ht 5\' 6"  (1.676 m)   Wt 161 lb 12.8 oz (73.4 kg)   SpO2 96%   BMI 26.12 kg/m     Wt Readings from Last 3 Encounters:  06/26/22 161 lb 12.8 oz (73.4 kg)  04/21/22 166 lb 9.6 oz (75.6 kg)  10/27/21 167 lb 12.8 oz (76.1 kg)     GEN:  Well nourished, well developed in no acute distress HEENT: Normal NECK: No JVD; No carotid bruits CARDIAC: RRR, no murmurs, rubs, gallops RESPIRATORY:  Clear  to auscultation without rales, wheezing or rhonchi  ABDOMEN: Soft, non-tender, non-distended MUSCULOSKELETAL:  No edema; No deformity  SKIN: Warm and dry NEUROLOGIC:  Alert and oriented x 3 PSYCHIATRIC:  Normal affect   ASSESSMENT:    1. Coronary artery disease involving native coronary artery of native heart, unspecified whether angina present   2. Primary hypertension   3. Hyperlipidemia LDL goal <70   4. Smoking    PLAN:    In order of problems listed above:  CAD, three-vessel coronary calcification on chest CT.  LDL at goal.  Denies chest pain.  Continue aspirin 81 mg, Crestor 40 mg daily.  Get echocardiogram. History of hypertension, BP running low, occasional dizziness.  A1c 6.7.  Stop Toprol-XL, reduce lisinopril to 5 mg daily. Goal LDL less than 70.  Continue Zetia, Crestor 40 mg. Current smoker, smoking cessation advised  Follow-up in 2 months       Medication Adjustments/Labs and Tests Ordered: Current medicines are reviewed at length with the patient today.  Concerns regarding medicines are outlined above.  Orders Placed This Encounter  Procedures   EKG 12-Lead   ECHOCARDIOGRAM COMPLETE   Meds ordered this encounter  Medications   lisinopril (ZESTRIL) 5 MG tablet    Sig: Take 1 tablet (5 mg total) by mouth daily.    Dispense:  90 tablet    Refill:  1    Dose decrease    Patient Instructions  Medication Instructions:  - Your physician has recommended you make the following change in your medication:   1) STOP Metoprolol   2) DECREASE Lisinopril to 5  mg: - take 1 tablet by mouth once daily   *If you need a refill on your cardiac medications before your next appointment, please call your pharmacy*   Lab Work: - none ordered  If you have labs (blood work) drawn today and your tests are completely normal, you will receive your results only by: MyChart Message (if you have MyChart) OR A paper copy in the mail If you have any lab test that is abnormal or we need to change your treatment, we will call you to review the results.   Testing/Procedures:  1) Echocardiogram: - Your physician has requested that you have an echocardiogram. Echocardiography is a painless test that uses sound waves to create images of your heart. It provides your doctor with information about the size and shape of your heart and how well your heart's chambers and valves are working. This procedure takes approximately one hour. There are no restrictions for this procedure. There is a possibility that an IV may need to be started during your test to inject an image enhancing agent. This is done to obtain more optimal pictures of your heart. Therefore we ask that you do at least drink some water prior to coming in to hydrate your veins.   Please do NOT wear cologne, perfume, aftershave, or lotions (deodorant is allowed). Please arrive 15 minutes prior to your appointment time.    Follow-Up: At [facility], you and your health needs are our priority.  As part of our continuing mission to provide you with exceptional heart care, we have created designated Provider Care Teams.  These Care Teams include your primary Cardiologist (physician) and Advanced Practice Providers (APPs -  Physician Assistants and Nurse Practitioners) who all work together to provide you with the care you need, when you need it.  We recommend signing up for the patient portal called "MyChart".  Sign  up information is provided on this After Visit Summary.  MyChart is used to connect with  patients for Virtual Visits (Telemedicine).  Patients are able to view lab/test results, encounter notes, upcoming appointments, etc.  Non-urgent messages can be sent to your provider as well.   To learn more about what you can do with MyChart, go to [email or web address].    Your next appointment:   2 month(s)  Provider:   You may see Debbe Odea, MD or one of the following Advanced Practice Providers on your designated Care Team:   Nicolasa Ducking, NP Eula Listen, PA-C Cadence Fransico Michael, PA-C Charlsie Quest, NP    Other Instructions  Echocardiogram An echocardiogram is a test that uses sound waves (ultrasound) to produce images of the heart. Images from an echocardiogram can provide important information about: Heart size and shape. The size and thickness and movement of your heart's walls. Heart muscle function and strength. Heart valve function or if you have stenosis. Stenosis is when the heart valves are too narrow. If blood is flowing backward through the heart valves (regurgitation). A tumor or infectious growth around the heart valves. Areas of heart muscle that are not working well because of poor blood flow or injury from a heart attack. Aneurysm detection. An aneurysm is a weak or damaged part of an artery wall. The wall bulges out from the normal force of blood pumping through the body. Tell a health care provider about: Any allergies you have. All medicines you are taking, including vitamins, herbs, eye drops, creams, and over-the-counter medicines. Any blood disorders you have. Any surgeries you have had. Any medical conditions you have. Whether you are pregnant or may be pregnant. What are the risks? Generally, this is a safe test. However, problems may occur, including an allergic reaction to dye (contrast) that may be used during the test. What happens before the test? No specific preparation is needed. You may eat and drink normally. What happens during the  test?  You will take off your clothes from the waist up and put on a hospital gown. Electrodes or electrocardiogram (ECG)patches may be placed on your chest. The electrodes or patches are then connected to a device that monitors your heart rate and rhythm. You will lie down on a table for an ultrasound exam. A gel will be applied to your chest to help sound waves pass through your skin. A handheld device, called a transducer, will be pressed against your chest and moved over your heart. The transducer produces sound waves that travel to your heart and bounce back (or "echo" back) to the transducer. These sound waves will be captured in real-time and changed into images of your heart that can be viewed on a video monitor. The images will be recorded on a computer and reviewed by your health care provider. You may be asked to change positions or hold your breath for a short time. This makes it easier to get different views or better views of your heart. In some cases, you may receive contrast through an IV in one of your veins. This can improve the quality of the pictures from your heart. The procedure may vary among health care providers and hospitals. What can I expect after the test? You may return to your normal, everyday life, including diet, activities, and medicines, unless your health care provider tells you not to do that. Follow these instructions at home: It is up to you to get the results of your test. Ask your  health care provider, or the department that is doing the test, when your results will be ready. Keep all follow-up visits. This is important. Summary An echocardiogram is a test that uses sound waves (ultrasound) to produce images of the heart. Images from an echocardiogram can provide important information about the size and shape of your heart, heart muscle function, heart valve function, and other possible heart problems. You do not need to do anything to prepare before this  test. You may eat and drink normally. After the echocardiogram is completed, you may return to your normal, everyday life, unless your health care provider tells you not to do that. This information is not intended to replace advice given to you by your health care provider. Make sure you discuss any questions you have with your health care provider. Document Revised: 09/25/2020 Document Reviewed: 09/05/2019 Elsevier Patient Education  2024 Elsevier Inc.     Signed, Debbe Odea, MD  06/26/2022 1:27 PM    [facility]

## 2022-06-26 NOTE — Patient Instructions (Addendum)
Medication Instructions:  - Your physician has recommended you make the following change in your medication:   1) STOP Metoprolol   2) DECREASE Lisinopril to 5 mg: - take 1 tablet by mouth once daily   *If you need a refill on your cardiac medications before your next appointment, please call your pharmacy*   Lab Work: - none ordered  If you have labs (blood work) drawn today and your tests are completely normal, you will receive your results only by: MyChart Message (if you have MyChart) OR A paper copy in the mail If you have any lab test that is abnormal or we need to change your treatment, we will call you to review the results.   Testing/Procedures:  1) Echocardiogram: - Your physician has requested that you have an echocardiogram. Echocardiography is a painless test that uses sound waves to create images of your heart. It provides your doctor with information about the size and shape of your heart and how well your heart's chambers and valves are working. This procedure takes approximately one hour. There are no restrictions for this procedure. There is a possibility that an IV may need to be started during your test to inject an image enhancing agent. This is done to obtain more optimal pictures of your heart. Therefore we ask that you do at least drink some water prior to coming in to hydrate your veins.   Please do NOT wear cologne, perfume, aftershave, or lotions (deodorant is allowed). Please arrive 15 minutes prior to your appointment time.    Follow-Up: At [facility], you and your health needs are our priority.  As part of our continuing mission to provide you with exceptional heart care, we have created designated Provider Care Teams.  These Care Teams include your primary Cardiologist (physician) and Advanced Practice Providers (APPs -  Physician Assistants and Nurse Practitioners) who all work together to provide you with the care you need, when you need  it.  We recommend signing up for the patient portal called "MyChart".  Sign up information is provided on this After Visit Summary.  MyChart is used to connect with patients for Virtual Visits (Telemedicine).  Patients are able to view lab/test results, encounter notes, upcoming appointments, etc.  Non-urgent messages can be sent to your provider as well.   To learn more about what you can do with MyChart, go to [email or web address].    Your next appointment:   2 month(s)  Provider:   You may see Debbe Odea, MD or one of the following Advanced Practice Providers on your designated Care Team:   Nicolasa Ducking, NP Eula Listen, PA-C Cadence Fransico Michael, PA-C Charlsie Quest, NP    Other Instructions  Echocardiogram An echocardiogram is a test that uses sound waves (ultrasound) to produce images of the heart. Images from an echocardiogram can provide important information about: Heart size and shape. The size and thickness and movement of your heart's walls. Heart muscle function and strength. Heart valve function or if you have stenosis. Stenosis is when the heart valves are too narrow. If blood is flowing backward through the heart valves (regurgitation). A tumor or infectious growth around the heart valves. Areas of heart muscle that are not working well because of poor blood flow or injury from a heart attack. Aneurysm detection. An aneurysm is a weak or damaged part of an artery wall. The wall bulges out from the normal force of blood pumping through the body. Tell a health care provider about:  Any allergies you have. All medicines you are taking, including vitamins, herbs, eye drops, creams, and over-the-counter medicines. Any blood disorders you have. Any surgeries you have had. Any medical conditions you have. Whether you are pregnant or may be pregnant. What are the risks? Generally, this is a safe test. However, problems may occur, including an allergic reaction to dye  (contrast) that may be used during the test. What happens before the test? No specific preparation is needed. You may eat and drink normally. What happens during the test?  You will take off your clothes from the waist up and put on a hospital gown. Electrodes or electrocardiogram (ECG)patches may be placed on your chest. The electrodes or patches are then connected to a device that monitors your heart rate and rhythm. You will lie down on a table for an ultrasound exam. A gel will be applied to your chest to help sound waves pass through your skin. A handheld device, called a transducer, will be pressed against your chest and moved over your heart. The transducer produces sound waves that travel to your heart and bounce back (or "echo" back) to the transducer. These sound waves will be captured in real-time and changed into images of your heart that can be viewed on a video monitor. The images will be recorded on a computer and reviewed by your health care provider. You may be asked to change positions or hold your breath for a short time. This makes it easier to get different views or better views of your heart. In some cases, you may receive contrast through an IV in one of your veins. This can improve the quality of the pictures from your heart. The procedure may vary among health care providers and hospitals. What can I expect after the test? You may return to your normal, everyday life, including diet, activities, and medicines, unless your health care provider tells you not to do that. Follow these instructions at home: It is up to you to get the results of your test. Ask your health care provider, or the department that is doing the test, when your results will be ready. Keep all follow-up visits. This is important. Summary An echocardiogram is a test that uses sound waves (ultrasound) to produce images of the heart. Images from an echocardiogram can provide important information about the  size and shape of your heart, heart muscle function, heart valve function, and other possible heart problems. You do not need to do anything to prepare before this test. You may eat and drink normally. After the echocardiogram is completed, you may return to your normal, everyday life, unless your health care provider tells you not to do that. This information is not intended to replace advice given to you by your health care provider. Make sure you discuss any questions you have with your health care provider. Document Revised: 09/25/2020 Document Reviewed: 09/05/2019 Elsevier Patient Education  2024 [employer].

## 2022-06-30 ENCOUNTER — Other Ambulatory Visit: Payer: Self-pay | Admitting: Family Medicine

## 2022-06-30 DIAGNOSIS — I10 Essential (primary) hypertension: Secondary | ICD-10-CM

## 2022-06-30 NOTE — Telephone Encounter (Signed)
Medication no longer on current medication list Requested Prescriptions  Pending Prescriptions Disp Refills   metoprolol succinate (TOPROL-XL) 50 MG 24 hr tablet [Pharmacy Med Name: METOPROLOL SUCC ER 50 MG TAB] 90 tablet 0    Sig: TAKE 1 TABLET BY MOUTH EVERY DAY WITH OR IMMEDIATELY FOLLOWING A MEAL     Cardiovascular:  Beta Blockers Passed - 06/30/2022  2:30 AM      Passed - Last BP in normal range    BP Readings from Last 1 Encounters:  06/26/22 92/69         Passed - Last Heart Rate in normal range    Pulse Readings from Last 1 Encounters:  06/26/22 87         Passed - Valid encounter within last 6 months    Recent Outpatient Visits           2 months ago Coronary artery disease involving native coronary artery of native heart without angina pectoris   [facility] Malva Limes, MD   8 months ago Annual physical exam   [facility] Malva Limes, MD   2 years ago Essential (primary) hypertension   [facility] Malva Limes, MD   3 years ago Aortic atherosclerosis [facility])   [facility] Malva Limes, MD   3 years ago Annual physical exam   [facility] Malva Limes, MD       Future Appointments             In 1 month Fisher, Demetrios Isaacs, MD [facility]   In 1 month Agbor-Etang, Arlys John, MD [facility]

## 2022-07-01 ENCOUNTER — Other Ambulatory Visit: Payer: Self-pay

## 2022-07-02 ENCOUNTER — Other Ambulatory Visit: Payer: Medicare PPO

## 2022-07-09 ENCOUNTER — Telehealth: Payer: Self-pay | Admitting: Cardiology

## 2022-07-09 NOTE — Telephone Encounter (Signed)
Pt c/o medication issue:  1. Name of Medication:   metoprolol succinate (TOPROL-XL) 50 MG 24 hr tablet   2. How are you currently taking this medication (dosage and times per day)?   Currently not taking, stopped about 14 days ago  3. Are you having a reaction (difficulty breathing--STAT)?   4. What is your medication issue?   Patient is concerned she has been off of this medication as her BP was very low.  Patient states she is struggling to get through the day as she feels very weak.  Patient stated she took her BP at [facility] yesterday was 71/50.

## 2022-07-09 NOTE — Telephone Encounter (Signed)
Pt called reporting despite stopping metoprolol and decreasing lisinopril to 5 mg daily, BP continues to run low. Pt stated she checked her blood pressure yesterday at her daughter's home. She reported BP 71/50. Pt stated she feels very weak and will experience blurry vision at times. She also stated she feels dizzy when standing. No vitals to report currently.  Dr. Myriam Forehand made aware and recommended pt stop lisinopril and continue to monitor BP.  Nurse advised pt to change position slowly and increase hydration. Pt made aware of ED precaution should any new symptoms develop or worsen.

## 2022-07-16 ENCOUNTER — Ambulatory Visit
Admission: RE | Admit: 2022-07-16 | Discharge: 2022-07-16 | Disposition: A | Discharge status: Home / Self Care | Payer: Medicare PPO | Source: Ambulatory Visit | Attending: Acute Care | Admitting: Acute Care

## 2022-07-16 DIAGNOSIS — Z87891 Personal history of nicotine dependence: Secondary | ICD-10-CM | POA: Diagnosis not present

## 2022-07-16 DIAGNOSIS — R911 Solitary pulmonary nodule: Secondary | ICD-10-CM | POA: Insufficient documentation

## 2022-07-21 ENCOUNTER — Telehealth: Payer: Self-pay | Admitting: Acute Care

## 2022-07-21 ENCOUNTER — Ambulatory Visit: Payer: Self-pay | Admitting: *Deleted

## 2022-07-21 DIAGNOSIS — Z122 Encounter for screening for malignant neoplasm of respiratory organs: Secondary | ICD-10-CM

## 2022-07-21 DIAGNOSIS — F17210 Nicotine dependence, cigarettes, uncomplicated: Secondary | ICD-10-CM

## 2022-07-21 DIAGNOSIS — Z87891 Personal history of nicotine dependence: Secondary | ICD-10-CM

## 2022-07-21 NOTE — Telephone Encounter (Signed)
Call Report  

## 2022-07-21 NOTE — Telephone Encounter (Signed)
Called and spoke with pt . She reports that she has been having a lot of sinus issues and cough for past 2 weeks. I have advised pt to contact Dr Theodis Aguas office to get a sooner appt than 08/03/22. Pt verbalized understanding.  Will forward this message to Dr Sherrie Mustache so that he is aware.

## 2022-07-21 NOTE — Telephone Encounter (Signed)
IMPRESSION: 1. New patchy tree-in-bud opacity, ground-glass opacity, curvilinear thick parenchymal bands and patchy mucoid impaction in the right middle lobe and basilar left lower lobe compatible with mild multilobar bronchopneumonia. 2. Lung-RADS 2, benign appearance or behavior. Continue annual screening with low-dose chest CT without contrast in 12 months. 3. Three-vessel coronary atherosclerosis. 4. Stable left adrenal adenoma, for which no imaging follow-up is recommended. 5. Aortic Atherosclerosis (ICD10-I70.0) and Emphysema (ICD10-J43.9).

## 2022-07-21 NOTE — Telephone Encounter (Signed)
Summary: cough   Patient experiencing coughing, congestion. Patient had a routine CT today and the findings in her lungs revealed fluid and congestion. Patient was advised by pulmonary to contact PCP for treatment. Patient requesting antibiotics.       Chief Complaint: requesting medication / antibiotic s/p CT scan results Symptoms: cough x 2 weeks. Productive clear to pale yellow. Denies SOB no fever. Thought sx were sinusitis but did her annual CT scan today and results showed abnormal IMPRESSION: 1. New patchy tree-in-bud opacity, ground-glass opacity, curvilinear thick parenchymal bands and patchy mucoid impaction in the right middle lobe and basilar left lower lobe compatible with mild multilobar bronchopneumonia. 2. Lung-RADS 2, benign appearance or behavior. Continue annual screening with low-dose chest CT without contrast in 12 months. 3. Three-vessel coronary atherosclerosis. 4. Stable left adrenal adenoma, for which no imaging follow-up is recommended. 5. Aortic Atherosclerosis (ICD10-I70.0) and Emphysema (ICD10-J43.9).   These results will be called to the ordering clinician or representative by the Radiologist Assistant, and communication documented in the PACS or [employer].     Electronically Signed   By: Delbert Phenix M.D.   On: 07/21/2022 10:51          Frequency: 2 weeks Pertinent Negatives: Patient denies chest pain no difficulty breathing no SOB no fever Disposition: [] ED /[] Urgent Care (no appt availability in office) / [] Appointment(In office/virtual)/ []  [facility]/ [] Home Care/ [] Refused Recommended Disposition /[] [facility]/ [x]  Follow-up with PCP Additional Notes:   Requesting if medication can be prescribed, next OV scheduled in July. Please advise if another OV needed. Recommended if sx worsen to go to [facility] / ED. Please advise .       Reason for Disposition  Cough has been present for > 3 weeks    Atleast 2  weeks  Answer Assessment - Initial Assessment Questions 1. ONSET: "When did the cough begin?"      Couple of weeks  thought was sinus 2. SEVERITY: "How bad is the cough today?"      Worse at night  3. SPUTUM: "Describe the color of your sputum" (none, dry cough; clear, white, yellow, green)     Clear to pale yellow  blow nose for clear  4. HEMOPTYSIS: "Are you coughing up any blood?" If so ask: "How much?" (flecks, streaks, tablespoons, etc.)     No  5. DIFFICULTY BREATHING: "Are you having difficulty breathing?" If Yes, ask: "How bad is it?" (e.g., mild, moderate, severe)    - MILD: No SOB at rest, mild SOB with walking, speaks normally in sentences, can lie down, no retractions, pulse < 100.    - MODERATE: SOB at rest, SOB with minimal exertion and prefers to sit, cannot lie down flat, speaks in phrases, mild retractions, audible wheezing, pulse 100-120.    - SEVERE: Very SOB at rest, speaks in single words, struggling to breathe, sitting hunched forward, retractions, pulse > 120      No more than normal 6. FEVER: "Do you have a fever?" If Yes, ask: "What is your temperature, how was it measured, and when did it start?"     na 7. CARDIAC HISTORY: "Do you have any history of heart disease?" (e.g., heart attack, congestive heart failure)      No  8. LUNG HISTORY: "Do you have any history of lung disease?"  (e.g., pulmonary embolus, asthma, emphysema)     See hx  9. PE RISK FACTORS: "Do you have a history of blood  clots?" (or: recent major surgery, recent prolonged travel, bedridden)     Na  10. OTHER SYMPTOMS: "Do you have any other symptoms?" (e.g., runny nose, wheezing, chest pain)       Runny nose cough productive clear pale yellow mucus 11. PREGNANCY: "Is there any chance you are pregnant?" "When was your last menstrual period?"       na 12. TRAVEL: "Have you traveled out of the country in the last month?" (e.g., travel history, exposures)       na  Protocols used: Cough - Acute  Productive-A-AH

## 2022-07-22 ENCOUNTER — Telehealth: Payer: Self-pay | Admitting: Family Medicine

## 2022-07-22 ENCOUNTER — Other Ambulatory Visit: Payer: Self-pay | Admitting: Family Medicine

## 2022-07-22 DIAGNOSIS — J189 Pneumonia, unspecified organism: Secondary | ICD-10-CM | POA: Insufficient documentation

## 2022-07-22 MED ORDER — AZITHROMYCIN 250 MG PO TABS: ORAL_TABLET | ORAL | 0 refills | Status: AC

## 2022-07-22 MED ORDER — AMOXICILLIN-POT CLAVULANATE 875-125 MG PO TABS: 1.0000 | ORAL_TABLET | Freq: Two times a day (BID) | ORAL | 0 refills | Status: AC

## 2022-07-22 NOTE — Telephone Encounter (Signed)
Detailed message left per DPR. CRM created. Ok for [facility] [address] to advise

## 2022-07-22 NOTE — Telephone Encounter (Signed)
Treatment for pneumonia has been sent to pharmacy including Augmentin for 10 days and azithromycin for 5 days.

## 2022-07-22 NOTE — Telephone Encounter (Signed)
Pt was prescribed amoxicillin-clavulanate (AUGMENTIN) 875-125 MG tablet [[id number]] and azithromycin (ZITHROMAX) 250 MG tablet [[id number]] and the pharmacy says they are both antibiotics and wants to know does she need to take both medications or which one does she need to take. Please follow up with pt.

## 2022-07-23 ENCOUNTER — Ambulatory Visit: Payer: Self-pay | Admitting: *Deleted

## 2022-07-23 NOTE — Telephone Encounter (Signed)
Detailed message left per DPR. CRM created. Ok for [facility] to advise if patient returns call

## 2022-07-23 NOTE — Telephone Encounter (Signed)
Summary: how to take medication   Patient went to pick up her scripts azithromycin (ZITHROMAX) 250 MG tablet and amoxicillin-clavulanate (AUGMENTIN) 875-125 MG tablet. The pharmacy advised she would need to contact provider to see if she should take both together or if she should take one at a time. Please f/u with patient with directions       Called patient to review message from Dr. Roxan Hockey from 07/23/22 at 8:09 am .  Please advise patient to take both antibiotics prescribed.      Advised patient she can take antibiotics together and if sx occur call back.  Can also eat probiotic yogurt to decrease SE.    Reason for Disposition  [1] Follow-up call to recent contact AND [2] information only call, no triage required  Answer Assessment - Initial Assessment Questions 1. REASON FOR CALL or QUESTION: "What is your reason for calling today?" or "How can I best help you?" or "What question do you have that I can help answer?"     How to take prescribed antibiotics augementin and zithromax  Protocols used: Information Only Call - No Triage-A-AH

## 2022-07-23 NOTE — Telephone Encounter (Signed)
Please advise patient to take both antibiotics prescribed.

## 2022-08-03 ENCOUNTER — Encounter: Payer: Self-pay | Admitting: Family Medicine

## 2022-08-03 ENCOUNTER — Ambulatory Visit: Payer: Medicare PPO | Admitting: Family Medicine

## 2022-08-03 VITALS — BP 97/62 | HR 60 | Ht 66.0 in | Wt 156.0 lb

## 2022-08-03 DIAGNOSIS — R7303 Prediabetes: Secondary | ICD-10-CM | POA: Diagnosis not present

## 2022-08-03 NOTE — Progress Notes (Signed)
Established patient visit   Patient: Angela Winters   DOB: Jul 26, 1950   72 y.o. Female  MRN: [id number] Visit Date: 08/03/2022  Today's healthcare provider: Mila Merry, MD    Subjective    Discussed the use of AI scribe software for clinical note transcription with the patient, who gave verbal consent to proceed.  History of Present Illness   The patient, diagnosed with pre-diabetes, presented for a follow-up visit to monitor her blood glucose levels. She reported a recent change in dietary habits, specifically reducing the intake of sweets and soft drinks, in an attempt to manage her blood sugar levels.  She also mentioned a recent fondness for ginger snaps, which she has since discontinued due to the high sugar content. her a1c in March had risen to 6.7%  In addition to diabetes, the patient has been dealing with blood pressure issues. She reported a recent drop in blood pressure, which was so significant that her cardiologist provider adjusted her medication regimen, reducing the dosage of Lisinopril and discontinuing Metoprolol. However, this change led to feelings of dizziness and lethargy, prompting the healthcare provider to discontinue Lisinopril as well. Despite these changes, the patient's blood pressure remained low at the time of the visit, although she has been feeling better.   The patient also mentioned an upcoming echocardiogram, scheduled for the 25th, but did not report any cardiac symptoms such as chest pain or heart flutters. She also noted a recent weight loss of about 10 pounds, which she attributed to increased physical activity and stress following the death of her father and the need to care for her mother and granddaughters. Despite the weight loss, the patient admitted to not eating well due to her busy schedule.       Medications: Outpatient Medications Prior to Visit  Medication Sig   aspirin 81 MG tablet Take 81 mg by mouth daily.    cholecalciferol  (VITAMIN D) 1000 UNITS tablet VITAMIN D, 1000UNIT (Oral Tablet)  1 Every Day for 0 days  Quantity: 0.00;  Refills: 0   Ordered :09-Jan-2010  Angela Winters ;  Started 11-Sep-2008 Active   citalopram (CELEXA) 40 MG tablet TAKE 1 TABLET BY MOUTH EVERY DAY   ezetimibe (ZETIA) 10 MG tablet TAKE 1 TABLET BY MOUTH EVERY DAY   MULTIPLE VITAMIN PO 1 tablet daily.   rosuvastatin (CRESTOR) 40 MG tablet TAKE 1 TABLET BY MOUTH EVERY DAY   meloxicam (MOBIC) 15 MG tablet TAKE 1 TABLET (15 MG TOTAL) BY MOUTH DAILY. AS NEEDED (Patient not taking: Reported on 08/03/2022)   No facility-administered medications prior to visit.   Review of Systems  Constitutional:  Negative for appetite change, chills, fatigue and fever.  Respiratory:  Negative for chest tightness and shortness of breath.   Cardiovascular:  Negative for chest pain and palpitations.  Gastrointestinal:  Negative for abdominal pain, nausea and vomiting.  Neurological:  Negative for dizziness and weakness.       Objective    BP 97/62 (BP Location: Right Arm, Patient Position: Sitting, Cuff Size: Normal)   Pulse 60   Ht 5\' 6"  (1.676 m)   Wt 156 lb (70.8 kg)   SpO2 96%   BMI 25.18 kg/m    Physical Exam  General appearance: Well developed, well nourished female, cooperative and in no acute distress Head: Normocephalic, without obvious abnormality, atraumatic Respiratory: Respirations even and unlabored, normal respiratory rate Extremities: All extremities are intact.  Skin: Skin color, texture, turgor normal. No rashes  seen  Psych: Appropriate mood and affect. Neurologic: Mental status: Alert, oriented to person, place, and time, thought content appropriate.   Assessment & Plan     Assessment and Plan    Pre Diabetes Mellitus: A1c of 6.7 in March, indicating an average blood glucose of 146. Patient has made dietary changes, including reducing intake of sweets and soft drinks. -Check A1c today to assess current glycemic  control.  Hypertension: Recent episodes of low blood pressure, leading to discontinuation of Metoprolol and Lisinopril by her cardiologist. Patient reports feeling dizzy and lifeless after stopping these medications. -Continue monitoring blood pressure.  General Health Maintenance: -Scheduled for echocardiogram on 08/20/2022. -Weight loss noted, likely contributing to lower blood pressure. Encourage continued healthy lifestyle habits.     No follow-ups on file.        Mila Merry, MD  [facility] [phone] (phone) [phone] (fax)  [facility]

## 2022-08-04 ENCOUNTER — Other Ambulatory Visit: Payer: Self-pay | Admitting: Family Medicine

## 2022-08-04 DIAGNOSIS — E119 Type 2 diabetes mellitus without complications: Secondary | ICD-10-CM

## 2022-08-04 LAB — HEMOGLOBIN A1C
Est. average glucose Bld gHb Est-mCnc: 146 mg/dL
Hgb A1c MFr Bld: 6.7 % — ABNORMAL HIGH (ref 4.8–5.6)

## 2022-08-04 MED ORDER — METFORMIN HCL ER 500 MG PO TB24: 500.0000 mg | ORAL_TABLET | Freq: Every day | ORAL | 3 refills | Status: DC

## 2022-08-20 ENCOUNTER — Ambulatory Visit: Payer: Medicare PPO | Attending: Cardiology

## 2022-08-20 DIAGNOSIS — I2510 Atherosclerotic heart disease of native coronary artery without angina pectoris: Secondary | ICD-10-CM | POA: Diagnosis not present

## 2022-08-20 LAB — ECHOCARDIOGRAM COMPLETE
AR max vel: 2.47 cm2
AV Area VTI: 2.37 cm2
AV Area mean vel: 2.35 cm2
AV Mean grad: 4.5 mmHg
AV Peak grad: 8.1 mmHg
Ao pk vel: 1.42 m/s
Area-P 1/2: 4.53 cm2
S' Lateral: 2.6 cm
Single Plane A4C EF: 53.3 %

## 2022-08-27 ENCOUNTER — Ambulatory Visit: Payer: Medicare PPO | Attending: Cardiology | Admitting: Cardiology

## 2022-08-27 ENCOUNTER — Encounter: Payer: Self-pay | Admitting: Cardiology

## 2022-08-27 VITALS — BP 110/70 | HR 100 | Ht 66.5 in | Wt 155.5 lb

## 2022-08-27 DIAGNOSIS — I517 Cardiomegaly: Secondary | ICD-10-CM

## 2022-08-27 DIAGNOSIS — I10 Essential (primary) hypertension: Secondary | ICD-10-CM

## 2022-08-27 DIAGNOSIS — F17200 Nicotine dependence, unspecified, uncomplicated: Secondary | ICD-10-CM

## 2022-08-27 DIAGNOSIS — I2510 Atherosclerotic heart disease of native coronary artery without angina pectoris: Secondary | ICD-10-CM

## 2022-08-27 LAB — HEMOGLOBIN AND HEMATOCRIT, BLOOD
Hematocrit: 44.5 % (ref 34.0–46.6)
Hemoglobin: 14.7 g/dL (ref 11.1–15.9)

## 2022-08-27 NOTE — Progress Notes (Signed)
Cardiology Office Note:    Date:  08/27/2022   ID:  Angela Winters, DOB 1950/12/02, MRN [id number]  PCP:  Malva Limes, MD   [facility] Providers Cardiologist:  Debbe Odea, MD     Referring MD: Malva Limes, MD   Chief Complaint  Patient presents with   2 month follow up     Discuss Echo results.     History of Present Illness:    Angela Winters is a 72 y.o. female with a hx of hypertension, emphysema, current smoker x 50+ years who presents follow-up.  Previously seen due to coronary artery calcifications.  Echocardiogram obtained to evaluate any cardiac dysfunction.  Also endorses dizziness with getting up.  Her blood pressures were running low normal.  Lisinopril was reduced, metoprolol was stopped at last visit.  Patient however did stop taking lisinopril also.  She states feeling much better after stopping BP medications.  She feels dizzy far less frequently, with dizziness occurring when her blood pressure drops to the 90s.  Blood pressures are usually in the low 100s.  Denies palpitations, she still smokes, is working on quitting.  Prior notes Echo 7/24 EF 60 to 65%, severe basal septal LVH 1.6 cm,, no LVOT obstruction Patient had a chest CT 03/2022 for lung cancer screening, aortic atherosclerosis and three-vessel coronary calcification noted, emphysema also noted.    Past Medical History:  Diagnosis Date   Chronic radicular low back pain    Hyperlipidemia    Hypertension    Sciatica of left side    Spinal stenosis at L4-L5 level     Past Surgical History:  Procedure Laterality Date   CATARACT EXTRACTION W/PHACO Right 02/19/2021   Procedure: CATARACT EXTRACTION PHACO AND INTRAOCULAR LENS PLACEMENT (IOC) RIGHT;  Surgeon: Lockie Mola, MD;  Location: [facility];  Service: Ophthalmology;  Laterality: Right;  9.68 01:21.2   CATARACT EXTRACTION W/PHACO Left 03/05/2021   Procedure: CATARACT EXTRACTION PHACO AND INTRAOCULAR LENS  PLACEMENT (IOC) LEFT 6.90 01:10.4;  Surgeon: Lockie Mola, MD;  Location: [facility];  Service: Ophthalmology;  Laterality: Left;   REPLACEMENT TOTAL KNEE Left 07/24/2013   Dr. Ernest Pine, [facility]    Current Medications: Current Meds  Medication Sig   aspirin 81 MG tablet Take 81 mg by mouth daily.    cholecalciferol (VITAMIN D) 1000 UNITS tablet VITAMIN D, 1000UNIT (Oral Tablet)  1 Every Day for 0 days  Quantity: 0.00;  Refills: 0   Ordered :09-Jan-2010  Burnell Blanks ;  Started 11-Sep-2008 Active   citalopram (CELEXA) 40 MG tablet TAKE 1 TABLET BY MOUTH EVERY DAY   ezetimibe (ZETIA) 10 MG tablet TAKE 1 TABLET BY MOUTH EVERY DAY   meloxicam (MOBIC) 15 MG tablet TAKE 1 TABLET (15 MG TOTAL) BY MOUTH DAILY. AS NEEDED   metFORMIN (GLUCOPHAGE-XR) 500 MG 24 hr tablet Take 1 tablet (500 mg total) by mouth daily with breakfast.   MULTIPLE VITAMIN PO 1 tablet daily.   rosuvastatin (CRESTOR) 40 MG tablet TAKE 1 TABLET BY MOUTH EVERY DAY     Allergies:   Codeine   Social History   Socioeconomic History   Marital status: Married    Spouse name: Not on file   Number of children: 1   Years of education: Not on file   Highest education level: 12th grade  Occupational History   Occupation: retired  Tobacco Use   Smoking status: Every Day    Current packs/day: 0.75    Average packs/day: 0.8 packs/day  for 50.0 years (37.5 ttl pk-yrs)    Types: Cigarettes   Smokeless tobacco: Never   Tobacco comments:    Started around age 84  Vaping Use   Vaping status: Never Used  Substance and Sexual Activity   Alcohol use: Yes    Alcohol/week: 2.0 - 3.0 standard drinks of alcohol    Types: 2 - 3 Shots of liquor per week    Comment: occasional   Drug use: No   Sexual activity: Not on file  Other Topics Concern   Not on file  Social History Narrative   Not on file   Social Determinants of Health   Financial Resource Strain: Low Risk  (04/17/2022)   Overall Financial Resource Strain  (CARDIA)    Difficulty of Paying Living Expenses: Not hard at all  Food Insecurity: No Food Insecurity (04/17/2022)   Hunger Vital Sign    Worried About Running Out of Food in the Last Year: Never true    Ran Out of Food in the Last Year: Never true  Transportation Needs: No Transportation Needs (04/17/2022)   PRAPARE - Administrator, Civil Service (Medical): No    Lack of Transportation (Non-Medical): No  Physical Activity: Unknown (04/17/2022)   Exercise Vital Sign    Days of Exercise per Week: 0 days    Minutes of Exercise per Session: Not on file  Stress: No Stress Concern Present (04/17/2022)   [employer] of Occupational Health - Occupational Stress Questionnaire    Feeling of Stress : Only a little  Social Connections: Moderately Isolated (04/17/2022)   Social Connection and Isolation Panel [NHANES]    Frequency of Communication with Friends and Family: More than three times a week    Frequency of Social Gatherings with Friends and Family: More than three times a week    Attends Religious Services: Never    Database administrator or Organizations: No    Attends Engineer, structural: Not on file    Marital Status: Married     Family History: The patient's family history includes Cancer in her mother; Heart disease in her father. There is no history of Breast cancer.  ROS:   Please see the history of present illness.     All other systems reviewed and are negative.  EKGs/Labs/Other Studies Reviewed:    The following studies were reviewed today:   EKG:  EKG is  ordered today.  The ekg ordered today demonstrates normal sinus rhythm, heart rate 75  Recent Labs: 10/22/2021: Magnesium 1.9 04/21/2022: ALT 69; BUN 12; Creatinine, Ser 0.82; Hemoglobin 15.3; Platelets 202; Potassium 4.0; Sodium 149  Recent Lipid Panel    Component Value Date/Time   CHOL 133 04/21/2022 1308   TRIG 133 04/21/2022 1308   HDL 62 04/21/2022 1308   CHOLHDL 2.1 04/21/2022  1308   LDLCALC 48 04/21/2022 1308   LDLDIRECT 39 04/21/2022 1308     Risk Assessment/Calculations:               Physical Exam:    VS:  BP 110/70 (BP Location: Right Arm, Patient Position: Sitting, Cuff Size: Normal)   Pulse 100   Ht 5' 6.5" (1.689 m)   Wt 155 lb 8 oz (70.5 kg)   SpO2 93%   BMI 24.72 kg/m     Wt Readings from Last 3 Encounters:  08/27/22 155 lb 8 oz (70.5 kg)  08/03/22 156 lb (70.8 kg)  06/26/22 161 lb 12.8 oz (73.4 kg)  GEN:  Well nourished, well developed in no acute distress HEENT: Normal NECK: No JVD; No carotid bruits CARDIAC: RRR, 2/6 systolic murmur RESPIRATORY: Diminished breath sounds at bases ABDOMEN: Soft, non-tender, non-distended MUSCULOSKELETAL:  No edema; No deformity  SKIN: Warm and dry NEUROLOGIC:  Alert and oriented x 3 PSYCHIATRIC:  Normal affect   ASSESSMENT:    1. Coronary artery disease involving native coronary artery of native heart, unspecified whether angina present   2. LVH (left ventricular hypertrophy)   3. Primary hypertension   4. Smoking    PLAN:    In order of problems listed above:  CAD, three-vessel coronary calcification on chest CT.  LDL at goal.  Echo 7/24 EF 60 to 65%.  Denies chest pain.  Continue aspirin 81 mg, Crestor 40 mg daily.  LVH, severe basal septal thickness up to 1.6 cm, cannot evaluate for LVOT obstruction.  Obtain CMR to evaluate HCM, LVOT obstruction. Hypertension.  BP controlled off BP meds. patient feels far less dizzy.  Adequate hydration advised. Current smoker, smoking cessation advised  Follow-up in 3 months     Medication Adjustments/Labs and Tests Ordered: Current medicines are reviewed at length with the patient today.  Concerns regarding medicines are outlined above.  Orders Placed This Encounter  Procedures   MR CARDIAC MORPHOLOGY W WO CONTRAST   Hemoglobin and hematocrit, blood   No orders of the defined types were placed in this encounter.   Patient Instructions   Medication Instructions:   Your physician recommends that you continue on your current medications as directed. Please refer to the Current Medication list given to you today.  *If you need a refill on your cardiac medications before your next appointment, please call your pharmacy*   Lab Work:  Your physician recommends you get labs drawn today.   If you have labs (blood work) drawn today and your tests are completely normal, you will receive your results only by: MyChart Message (if you have MyChart) OR A paper copy in the mail If you have any lab test that is abnormal or we need to change your treatment, we will call you to review the results.   Testing/Procedures:   You are scheduled for Cardiac MRI on ______________. Please arrive for your appointment at ______________ ( arrive 30-45 minutes prior to test start time). ?  [facility] [address] [place], [place] [address] [phone] Please take advantage of the free valet parking available at the [facility] (Entrance C).  Proceed to the [facility] Radiology Department (First Floor) for check-in.   OR   [facility] [address] [place], [place] [address] [phone] Please go to the [facility] and check-in with the desk attendant.   Magnetic resonance imaging (MRI) is a painless test that produces images of the inside of the body without using Xrays.  During an MRI, strong magnets and radio waves work together in a Data processing manager to form detailed images.   MRI images may provide more details about a medical condition than X-rays, CT scans, and ultrasounds can provide.  You may be given earphones to listen for instructions.  You may eat a light breakfast and take medications as ordered with the exception of furosemide, hydrochlorothiazide, or spironolactone(fluid pill, other). Please avoid stimulants for 12 hr prior to test. (Ie. Caffeine,  nicotine, chocolate, or antihistamine medications)  If a contrast material will be used, an IV will be inserted into one of your  veins. Contrast material will be injected into your IV. It will leave your body through your urine within a day. You may be told to drink plenty of fluids to help flush the contrast material out of your system.  You will be asked to remove all metal, including: Watch, jewelry, and other metal objects including hearing aids, hair pieces and dentures. Also wearable glucose monitoring systems (ie. Freestyle Libre and Omnipods) (Braces and fillings normally are not a problem.)   TEST WILL TAKE APPROXIMATELY 1 HOUR  PLEASE NOTIFY SCHEDULING AT LEAST 24 HOURS IN ADVANCE IF YOU ARE UNABLE TO KEEP YOUR APPOINTMENT. [phone]  For more information and frequently asked questions, please visit our website : [email or web address]  Please call Rockwell Alexandria, cardiac imaging nurse navigator with any questions/concerns. Cardiac Imaging Nurse Navigators [facility] [phone] Office     Follow-Up: At [facility], you and your health needs are our priority.  As part of our continuing mission to provide you with exceptional heart care, we have created designated Provider Care Teams.  These Care Teams include your primary Cardiologist (physician) and Advanced Practice Providers (APPs -  Physician Assistants and Nurse Practitioners) who all work together to provide you with the care you need, when you need it.  We recommend signing up for the patient portal called "MyChart".  Sign up information is provided on this After Visit Summary.  MyChart is used to connect with patients for Virtual Visits (Telemedicine).  Patients are able to view lab/test results, encounter notes, upcoming appointments, etc.  Non-urgent messages can be sent to your provider as well.   To learn more about what you can do with MyChart, go to  [email or web address].    Your next appointment:    After Cardiac MRI  Provider:   You may see Debbe Odea, MD or one of the following Advanced Practice Providers on your designated Care Team:   Nicolasa Ducking, NP Eula Listen, PA-C Cadence Fransico Michael, PA-C Charlsie Quest, NP   Signed, Debbe Odea, MD  08/27/2022 11:27 AM    [facility]

## 2022-08-27 NOTE — Patient Instructions (Signed)
Medication Instructions:   Your physician recommends that you continue on your current medications as directed. Please refer to the Current Medication list given to you today.  *If you need a refill on your cardiac medications before your next appointment, please call your pharmacy*   Lab Work:  Your physician recommends you get labs drawn today.   If you have labs (blood work) drawn today and your tests are completely normal, you will receive your results only by: MyChart Message (if you have MyChart) OR A paper copy in the mail If you have any lab test that is abnormal or we need to change your treatment, we will call you to review the results.   Testing/Procedures:   You are scheduled for Cardiac MRI on ______________. Please arrive for your appointment at ______________ ( arrive 30-45 minutes prior to test start time). ?  [facility] [address] [place], [place] [address] [phone] Please take advantage of the free valet parking available at the [facility] and [employer] (Entrance C).  Proceed to the [facility] Radiology Department (First Floor) for check-in.   OR   [facility] [address] [place], [place] [address] [phone] Please go to the [facility] and check-in with the desk attendant.   Magnetic resonance imaging (MRI) is a painless test that produces images of the inside of the body without using Xrays.  During an MRI, strong magnets and radio waves work together in a Data processing manager to form detailed images.   MRI images may provide more details about a medical condition than X-rays, CT scans, and ultrasounds can provide.  You may be given earphones to listen for instructions.  You may eat a light breakfast and take medications as ordered with the exception of furosemide, hydrochlorothiazide, or spironolactone(fluid pill, other). Please avoid stimulants for 12 hr prior to test. (Ie. Caffeine,  nicotine, chocolate, or antihistamine medications)  If a contrast material will be used, an IV will be inserted into one of your veins. Contrast material will be injected into your IV. It will leave your body through your urine within a day. You may be told to drink plenty of fluids to help flush the contrast material out of your system.  You will be asked to remove all metal, including: Watch, jewelry, and other metal objects including hearing aids, hair pieces and dentures. Also wearable glucose monitoring systems (ie. Freestyle Libre and Omnipods) (Braces and fillings normally are not a problem.)   TEST WILL TAKE APPROXIMATELY 1 HOUR  PLEASE NOTIFY SCHEDULING AT LEAST 24 HOURS IN ADVANCE IF YOU ARE UNABLE TO KEEP YOUR APPOINTMENT. [phone]  For more information and frequently asked questions, please visit our website : [email or web address]  Please call Rockwell Alexandria, cardiac imaging nurse navigator with any questions/concerns. Cardiac Imaging Nurse Navigators  Heart and Vascular Services [phone] Office     Follow-Up: At [facility], you and your health needs are our priority.  As part of our continuing mission to provide you with exceptional heart care, we have created designated Provider Care Teams.  These Care Teams include your primary Cardiologist (physician) and Advanced Practice Providers (APPs -  Physician Assistants and Nurse Practitioners) who all work together to provide you with the care you need, when you need it.  We recommend signing up for the patient portal called "MyChart".  Sign up information is provided on this After Visit Summary.  MyChart is used to connect with patients for Virtual  Visits (Telemedicine).  Patients are able to view lab/test results, encounter notes, upcoming appointments, etc.  Non-urgent messages can be sent to your provider as well.   To learn more about what you can do with MyChart, go to  [email or web address].    Your next appointment:    After Cardiac MRI  Provider:   You may see Debbe Odea, MD or one of the following Advanced Practice Providers on your designated Care Team:   Nicolasa Ducking, NP Eula Listen, PA-C Cadence Fransico Michael, PA-C Charlsie Quest, NP

## 2022-10-09 ENCOUNTER — Ambulatory Visit: Payer: Medicare PPO | Admitting: Cardiology

## 2022-10-14 ENCOUNTER — Other Ambulatory Visit: Payer: Self-pay | Admitting: Family Medicine

## 2022-10-14 DIAGNOSIS — E7800 Pure hypercholesterolemia, unspecified: Secondary | ICD-10-CM

## 2022-10-19 ENCOUNTER — Encounter (HOSPITAL_COMMUNITY): Payer: Self-pay

## 2022-10-20 ENCOUNTER — Telehealth (HOSPITAL_COMMUNITY): Payer: Self-pay | Admitting: *Deleted

## 2022-10-20 NOTE — Telephone Encounter (Signed)
Reaching out to patient to offer assistance regarding upcoming cardiac imaging study; pt verbalizes understanding of appt date/time, parking situation and where to check in, pre-test NPO status and medications ordered, and verified current allergies; name and call back number provided for further questions should they arise Angela Frame RN Navigator Cardiac Imaging [facility] [phone] office [phone] cell  Patient reports prior knee replacement, denies claustrophobia.

## 2022-10-21 ENCOUNTER — Other Ambulatory Visit: Payer: Self-pay | Admitting: Cardiology

## 2022-10-21 ENCOUNTER — Ambulatory Visit
Admission: RE | Admit: 2022-10-21 | Discharge: 2022-10-21 | Disposition: A | Discharge status: Home / Self Care | Payer: Medicare PPO | Source: Ambulatory Visit | Attending: Cardiology | Admitting: Cardiology

## 2022-10-21 ENCOUNTER — Other Ambulatory Visit: Payer: Self-pay | Admitting: Family Medicine

## 2022-10-21 DIAGNOSIS — I517 Cardiomegaly: Secondary | ICD-10-CM | POA: Insufficient documentation

## 2022-10-21 DIAGNOSIS — I2510 Atherosclerotic heart disease of native coronary artery without angina pectoris: Secondary | ICD-10-CM | POA: Insufficient documentation

## 2022-10-21 MED ORDER — GADOBUTROL 1 MMOL/ML IV SOLN: 11.0000 mL | Freq: Once | INTRAVENOUS | Status: DC | PRN

## 2022-10-21 MED ORDER — GADOBUTROL 1 MMOL/ML IV SOLN
10.0000 mL | Freq: Once | INTRAVENOUS | Status: AC | PRN
  Administered 2022-10-21: 10 mL via INTRAVENOUS

## 2022-10-23 ENCOUNTER — Other Ambulatory Visit: Payer: Self-pay | Admitting: Family Medicine

## 2022-10-26 ENCOUNTER — Ambulatory Visit: Payer: Medicare PPO | Attending: Cardiology | Admitting: Cardiology

## 2022-10-26 ENCOUNTER — Encounter: Payer: Self-pay | Admitting: Cardiology

## 2022-10-26 VITALS — BP 97/68 | HR 104 | Ht 66.0 in | Wt 148.8 lb

## 2022-10-26 DIAGNOSIS — I2510 Atherosclerotic heart disease of native coronary artery without angina pectoris: Secondary | ICD-10-CM | POA: Diagnosis not present

## 2022-10-26 DIAGNOSIS — F17200 Nicotine dependence, unspecified, uncomplicated: Secondary | ICD-10-CM | POA: Diagnosis not present

## 2022-10-26 DIAGNOSIS — I517 Cardiomegaly: Secondary | ICD-10-CM | POA: Diagnosis not present

## 2022-10-26 DIAGNOSIS — I10 Essential (primary) hypertension: Secondary | ICD-10-CM

## 2022-10-26 NOTE — Patient Instructions (Signed)
Medication Instructions:   Your physician recommends that you continue on your current medications as directed. Please refer to the Current Medication list given to you today.  *If you need a refill on your cardiac medications before your next appointment, please call your pharmacy*   Lab Work:  None Ordered  If you have labs (blood work) drawn today and your tests are completely normal, you will receive your results only by: MyChart Message (if you have MyChart) OR A paper copy in the mail If you have any lab test that is abnormal or we need to change your treatment, we will call you to review the results.   Testing/Procedures:  None Ordered    Follow-Up: At [facility], you and your health needs are our priority.  As part of our continuing mission to provide you with exceptional heart care, we have created designated Provider Care Teams.  These Care Teams include your primary Cardiologist (physician) and Advanced Practice Providers (APPs -  Physician Assistants and Nurse Practitioners) who all work together to provide you with the care you need, when you need it.  We recommend signing up for the patient portal called "MyChart".  Sign up information is provided on this After Visit Summary.  MyChart is used to connect with patients for Virtual Visits (Telemedicine).  Patients are able to view lab/test results, encounter notes, upcoming appointments, etc.  Non-urgent messages can be sent to your provider as well.   To learn more about what you can do with MyChart, go to [email or web address].    Your next appointment:   12 month(s)  Provider:   You may see Brian Agbor-Etang, MD or one of the following Advanced Practice Providers on your designated Care Team:   Christopher Berge, NP Ryan Dunn, PA-C Cadence Furth, PA-C Sheri Hammock, NP  

## 2022-10-26 NOTE — Progress Notes (Signed)
Cardiology Office Note:    Date:  10/26/2022   ID:  Angela Winters, DOB February 21, 1950, MRN [id number]  PCP:  Malva Limes, MD   [facility] Providers Cardiologist:  Debbe Odea, MD     Referring MD: Malva Limes, MD   Chief Complaint  Patient presents with   Follow-up    Discuss cardiac testing results.  Patient denies new or acute cardiac problems/concerns today.      History of Present Illness:    Angela Winters is a 72 y.o. female with a hx of hypertension, emphysema, current smoker x 50+ years who presents follow-up.  Last seen due to LVH noted on echo.  Cardiac MRI obtained to evaluate any evidence for HCM or LVOT obstruction.  She feels well, denies dizziness, presyncope or syncope, denies palpitations.  Usually gets dizzy when her blood pressure drops to 90 systolic.  Not on any BP medications.  Denies any new concerns at this time.  Presents for cardiac MRI results.   Prior notes Echo 7/24 EF 60 to 65%, severe basal septal LVH 1.6 cm,, no LVOT obstruction Patient had a chest CT 03/2022 for lung cancer screening, aortic atherosclerosis and three-vessel coronary calcification noted, emphysema also noted.  Past Medical History:  Diagnosis Date   Chronic radicular low back pain    Hyperlipidemia    Hypertension    Sciatica of left side    Spinal stenosis at L4-L5 level     Past Surgical History:  Procedure Laterality Date   CATARACT EXTRACTION W/PHACO Right 02/19/2021   Procedure: CATARACT EXTRACTION PHACO AND INTRAOCULAR LENS PLACEMENT (IOC) RIGHT;  Surgeon: Lockie Mola, MD;  Location: [facility];  Service: Ophthalmology;  Laterality: Right;  9.68 01:21.2   CATARACT EXTRACTION W/PHACO Left 03/05/2021   Procedure: CATARACT EXTRACTION PHACO AND INTRAOCULAR LENS PLACEMENT (IOC) LEFT 6.90 01:10.4;  Surgeon: Lockie Mola, MD;  Location: [facility];  Service: Ophthalmology;  Laterality: Left;   REPLACEMENT TOTAL KNEE Left  07/24/2013   Dr. Ernest Pine, [facility]    Current Medications: Current Meds  Medication Sig   aspirin 81 MG tablet Take 81 mg by mouth daily.    cholecalciferol (VITAMIN D) 1000 UNITS tablet VITAMIN D, 1000UNIT (Oral Tablet)  1 Every Day for 0 days  Quantity: 0.00;  Refills: 0   Ordered :09-Jan-2010  Burnell Blanks ;  Started 11-Sep-2008 Active   citalopram (CELEXA) 40 MG tablet TAKE 1 TABLET BY MOUTH EVERY DAY   ezetimibe (ZETIA) 10 MG tablet TAKE 1 TABLET BY MOUTH EVERY DAY   meloxicam (MOBIC) 15 MG tablet TAKE 1 TABLET (15 MG TOTAL) BY MOUTH DAILY. AS NEEDED   metFORMIN (GLUCOPHAGE-XR) 500 MG 24 hr tablet Take 1 tablet (500 mg total) by mouth daily with breakfast.   MULTIPLE VITAMIN PO 1 tablet daily.   rosuvastatin (CRESTOR) 40 MG tablet TAKE 1 TABLET BY MOUTH EVERY DAY     Allergies:   Codeine   Social History   Socioeconomic History   Marital status: Married    Spouse name: Not on file   Number of children: 1   Years of education: Not on file   Highest education level: 12th grade  Occupational History   Occupation: retired  Tobacco Use   Smoking status: Every Day    Current packs/day: 0.75    Average packs/day: 0.8 packs/day for 50.0 years (37.5 ttl pk-yrs)    Types: Cigarettes   Smokeless tobacco: Never   Tobacco comments:    Started around  age 56  Vaping Use   Vaping status: Never Used  Substance and Sexual Activity   Alcohol use: Yes    Alcohol/week: 2.0 - 3.0 standard drinks of alcohol    Types: 2 - 3 Shots of liquor per week    Comment: occasional   Drug use: No   Sexual activity: Not on file  Other Topics Concern   Not on file  Social History Narrative   Not on file   Social Determinants of Health   Financial Resource Strain: Low Risk  (04/17/2022)   Overall Financial Resource Strain (CARDIA)    Difficulty of Paying Living Expenses: Not hard at all  Food Insecurity: No Food Insecurity (04/17/2022)   Hunger Vital Sign    Worried About Running Out of Food  in the Last Year: Never true    Ran Out of Food in the Last Year: Never true  Transportation Needs: No Transportation Needs (04/17/2022)   PRAPARE - Administrator, Civil Service (Medical): No    Lack of Transportation (Non-Medical): No  Physical Activity: Unknown (04/17/2022)   Exercise Vital Sign    Days of Exercise per Week: 0 days    Minutes of Exercise per Session: Not on file  Stress: No Stress Concern Present (04/17/2022)   [employer] of Occupational Health - Occupational Stress Questionnaire    Feeling of Stress : Only a little  Social Connections: Moderately Isolated (04/17/2022)   Social Connection and Isolation Panel [NHANES]    Frequency of Communication with Friends and Family: More than three times a week    Frequency of Social Gatherings with Friends and Family: More than three times a week    Attends Religious Services: Never    Database administrator or Organizations: No    Attends Engineer, structural: Not on file    Marital Status: Married     Family History: The patient's family history includes Cancer in her mother; Heart disease in her father. There is no history of Breast cancer.  ROS:   Please see the history of present illness.     All other systems reviewed and are negative.  EKGs/Labs/Other Studies Reviewed:    The following studies were reviewed today:  Recent Labs: 04/21/2022: ALT 69; BUN 12; Creatinine, Ser 0.82; Platelets 202; Potassium 4.0; Sodium 149 08/27/2022: Hemoglobin 14.7  Recent Lipid Panel    Component Value Date/Time   CHOL 133 04/21/2022 1308   TRIG 133 04/21/2022 1308   HDL 62 04/21/2022 1308   CHOLHDL 2.1 04/21/2022 1308   LDLCALC 48 04/21/2022 1308   LDLDIRECT 39 04/21/2022 1308     Risk Assessment/Calculations:               Physical Exam:    VS:  BP 97/68 (BP Location: Left Arm, Patient Position: Sitting, Cuff Size: Normal)   Pulse (!) 104   Ht 5\' 6"  (1.676 m)   Wt 148 lb 12.8 oz (67.5 kg)    SpO2 98%   BMI 24.02 kg/m     Wt Readings from Last 3 Encounters:  10/26/22 148 lb 12.8 oz (67.5 kg)  08/27/22 155 lb 8 oz (70.5 kg)  08/03/22 156 lb (70.8 kg)     GEN:  Well nourished, well developed in no acute distress HEENT: Normal NECK: No JVD; No carotid bruits CARDIAC: RRR, 2/6 systolic murmur RESPIRATORY: Diminished breath sounds at bases ABDOMEN: Soft, non-tender, non-distended MUSCULOSKELETAL:  No edema; No deformity  SKIN: Warm and dry  NEUROLOGIC:  Alert and oriented x 3 PSYCHIATRIC:  Normal affect   ASSESSMENT:    1. Coronary artery disease involving native coronary artery of native heart, unspecified whether angina present   2. LVH (left ventricular hypertrophy)   3. Primary hypertension   4. Smoking     PLAN:    In order of problems listed above:  CAD, three-vessel coronary calcification on chest CT.  LDL at goal.  Echo 7/24 EF 60 to 65%.  Denies chest pain.  Continue aspirin 81 mg, Crestor 40 mg daily.  LVH, cardiac MRI 9/24 shows moderate LVH, no LVOT obstruction, No findings to suggest HCM.   Hypertension.  BP low normal, avoid BP meds.  Adequate hydration advised Current smoker, smoking cessation advised  Follow-up in 12 months     Medication Adjustments/Labs and Tests Ordered: Current medicines are reviewed at length with the patient today.  Concerns regarding medicines are outlined above.  No orders of the defined types were placed in this encounter.  No orders of the defined types were placed in this encounter.   Patient Instructions  Medication Instructions:   Your physician recommends that you continue on your current medications as directed. Please refer to the Current Medication list given to you today.  *If you need a refill on your cardiac medications before your next appointment, please call your pharmacy*   Lab Work:  None Ordered  If you have labs (blood work) drawn today and your tests are completely normal, you will  receive your results only by: MyChart Message (if you have MyChart) OR A paper copy in the mail If you have any lab test that is abnormal or we need to change your treatment, we will call you to review the results.   Testing/Procedures:  None Ordered   Follow-Up: At [facility], you and your health needs are our priority.  As part of our continuing mission to provide you with exceptional heart care, we have created designated Provider Care Teams.  These Care Teams include your primary Cardiologist (physician) and Advanced Practice Providers (APPs -  Physician Assistants and Nurse Practitioners) who all work together to provide you with the care you need, when you need it.  We recommend signing up for the patient portal called "MyChart".  Sign up information is provided on this After Visit Summary.  MyChart is used to connect with patients for Virtual Visits (Telemedicine).  Patients are able to view lab/test results, encounter notes, upcoming appointments, etc.  Non-urgent messages can be sent to your provider as well.   To learn more about what you can do with MyChart, go to [email or web address].    Your next appointment:   12 month(s)  Provider:   You may see Debbe Odea, MD or one of the following Advanced Practice Providers on your designated Care Team:   Nicolasa Ducking, NP Eula Listen, PA-C Cadence Fransico Michael, PA-C Charlsie Quest, NP   Signed, Debbe Odea, MD  10/26/2022 11:20 AM    [facility]

## 2022-10-26 NOTE — Telephone Encounter (Signed)
Requested Prescriptions  Pending Prescriptions Disp Refills   citalopram (CELEXA) 40 MG tablet [Pharmacy Med Name: CITALOPRAM HBR 40 MG TABLET] 90 tablet 1    Sig: TAKE 1 TABLET BY MOUTH EVERY DAY     Psychiatry:  Antidepressants - SSRI Passed - 10/23/2022  7:20 PM      Passed - Valid encounter within last 6 months    Recent Outpatient Visits           2 months ago Pre-diabetes   [place] [facility] Malva Limes, MD   6 months ago Coronary artery disease involving native coronary artery of native heart without angina pectoris   [place] [facility] Malva Limes, MD   1 year ago Annual physical exam   [facility] Malva Limes, MD   2 years ago Essential (primary) hypertension   [place] Malva Limes, MD   3 years ago Aortic atherosclerosis [facility])   [facility] Sherrie Mustache, Demetrios Isaacs, MD       Future Appointments             Today Debbe Odea, MD [facility]   In 1 week Sherrie Mustache, Demetrios Isaacs, MD [facility]

## 2022-10-30 ENCOUNTER — Other Ambulatory Visit: Payer: Self-pay | Admitting: Family Medicine

## 2022-10-30 DIAGNOSIS — E119 Type 2 diabetes mellitus without complications: Secondary | ICD-10-CM

## 2022-10-30 NOTE — Telephone Encounter (Signed)
Reordered 08/04/22 #30 3 RF  Requested Prescriptions  Refused Prescriptions Disp Refills   metFORMIN (GLUCOPHAGE-XR) 500 MG 24 hr tablet [Pharmacy Med Name: METFORMIN HCL ER 500 MG TABLET] 90 tablet 1    Sig: TAKE 1 TABLET BY MOUTH EVERY DAY WITH BREAKFAST     Endocrinology:  Diabetes - Biguanides Failed - 10/30/2022  1:36 AM      Failed - B12 Level in normal range and within 720 days    No results found for: "VITAMINB12"       Failed - CBC within normal limits and completed in the last 12 months    WBC  Date Value Ref Range Status  04/21/2022 10.7 3.4 - 10.8 x10E3/uL Final  07/12/2013 9.8 3.6 - 11.0 x10 3/mm 3 Final   RBC  Date Value Ref Range Status  04/21/2022 4.78 3.77 - 5.28 x10E6/uL Final  07/12/2013 4.53 3.80 - 5.20 X10 6/mm 3 Final   Hemoglobin  Date Value Ref Range Status  08/27/2022 14.7 11.1 - 15.9 g/dL Final   Hematocrit  Date Value Ref Range Status  08/27/2022 44.5 34.0 - 46.6 % Final   MCHC  Date Value Ref Range Status  04/21/2022 33.8 31.5 - 35.7 g/dL Final  16/10/9602 54.0 32.0 - 36.0 g/dL Final   [facility]  Date Value Ref Range Status  04/21/2022 32.0 26.6 - 33.0 pg Final  07/12/2013 32.4 26.0 - 34.0 pg Final   MCV  Date Value Ref Range Status  04/21/2022 95 79 - 97 fL Final  07/12/2013 98 80 - 100 fL Final   No results found for: "PLTCOUNTKUC", "LABPLAT", "POCPLA" RDW  Date Value Ref Range Status  04/21/2022 12.9 11.7 - 15.4 % Final  07/12/2013 14.0 11.5 - 14.5 % Final         Passed - Cr in normal range and within 360 days    Creatinine  Date Value Ref Range Status  07/26/2013 0.60 0.60 - 1.30 mg/dL Final   Creatinine, Ser  Date Value Ref Range Status  04/21/2022 0.82 0.57 - 1.00 mg/dL Final         Passed - HBA1C is between 0 and 7.9 and within 180 days    Hgb A1c MFr Bld  Date Value Ref Range Status  08/03/2022 6.7 (H) 4.8 - 5.6 % Final    Comment:             Prediabetes: 5.7 - 6.4          Diabetes: >6.4          Glycemic control  for adults with diabetes: <7.0          Passed - eGFR in normal range and within 360 days    EGFR (African American)  Date Value Ref Range Status  07/26/2013 >60  Final   GFR calc Af Amer  Date Value Ref Range Status  11/20/2019 89 >59 mL/min/1.73 Final    Comment:    **In accordance with recommendations from the NKF-ASN Task force,**   [facility] is in the process of updating its eGFR calculation to the   2021 CKD-EPI creatinine equation that estimates kidney function   without a race variable.    EGFR (Non-African Amer.)  Date Value Ref Range Status  07/26/2013 >60  Final    Comment:    eGFR values <70mL/min/1.73 m2 may be an indication of chronic kidney disease (CKD). Calculated eGFR is useful in patients with stable renal function. The eGFR calculation will not be reliable in  acutely ill patients when serum creatinine is changing rapidly. It is not useful in  patients on dialysis. The eGFR calculation may not be applicable to patients at the low and high extremes of body sizes, pregnant women, and vegetarians. POTASSIUM - Slight hemolysis, interpret results with  - caution...tpl    GFR calc non Af Amer  Date Value Ref Range Status  11/20/2019 77 >59 mL/min/1.73 Final   eGFR  Date Value Ref Range Status  04/21/2022 76 >59 mL/min/1.73 Final         Passed - Valid encounter within last 6 months    Recent Outpatient Visits           2 months ago Pre-diabetes   [place] [facility] Malva Limes, MD   6 months ago Coronary artery disease involving native coronary artery of native heart without angina pectoris   [facility] Malva Limes, MD   1 year ago Annual physical exam   [facility] Malva Limes, MD   2 years ago Essential (primary) hypertension   [place] [facility] Malva Limes, MD   3 years ago Aortic atherosclerosis [facility])   [place] [facility] Malva Limes, MD       Future Appointments             In 5 days Fisher, Demetrios Isaacs, MD [facility]

## 2022-10-31 ENCOUNTER — Other Ambulatory Visit: Payer: Self-pay | Admitting: Family Medicine

## 2022-10-31 DIAGNOSIS — E119 Type 2 diabetes mellitus without complications: Secondary | ICD-10-CM

## 2022-11-02 NOTE — Telephone Encounter (Signed)
Requested Prescriptions  Refused Prescriptions Disp Refills   metFORMIN (GLUCOPHAGE-XR) 500 MG 24 hr tablet [Pharmacy Med Name: METFORMIN HCL ER 500 MG TABLET] 90 tablet 1    Sig: TAKE 1 TABLET BY MOUTH EVERY DAY WITH BREAKFAST     Endocrinology:  Diabetes - Biguanides Failed - 10/31/2022 12:15 PM      Failed - B12 Level in normal range and within 720 days    No results found for: "VITAMINB12"       Failed - CBC within normal limits and completed in the last 12 months    WBC  Date Value Ref Range Status  04/21/2022 10.7 3.4 - 10.8 x10E3/uL Final  07/12/2013 9.8 3.6 - 11.0 x10 3/mm 3 Final   RBC  Date Value Ref Range Status  04/21/2022 4.78 3.77 - 5.28 x10E6/uL Final  07/12/2013 4.53 3.80 - 5.20 X10 6/mm 3 Final   Hemoglobin  Date Value Ref Range Status  08/27/2022 14.7 11.1 - 15.9 g/dL Final   Hematocrit  Date Value Ref Range Status  08/27/2022 44.5 34.0 - 46.6 % Final   MCHC  Date Value Ref Range Status  04/21/2022 33.8 31.5 - 35.7 g/dL Final  84/69/6295 28.4 32.0 - 36.0 g/dL Final   [facility]  Date Value Ref Range Status  04/21/2022 32.0 26.6 - 33.0 pg Final  07/12/2013 32.4 26.0 - 34.0 pg Final   MCV  Date Value Ref Range Status  04/21/2022 95 79 - 97 fL Final  07/12/2013 98 80 - 100 fL Final   No results found for: "PLTCOUNTKUC", "LABPLAT", "POCPLA" RDW  Date Value Ref Range Status  04/21/2022 12.9 11.7 - 15.4 % Final  07/12/2013 14.0 11.5 - 14.5 % Final         Passed - Cr in normal range and within 360 days    Creatinine  Date Value Ref Range Status  07/26/2013 0.60 0.60 - 1.30 mg/dL Final   Creatinine, Ser  Date Value Ref Range Status  04/21/2022 0.82 0.57 - 1.00 mg/dL Final         Passed - HBA1C is between 0 and 7.9 and within 180 days    Hgb A1c MFr Bld  Date Value Ref Range Status  08/03/2022 6.7 (H) 4.8 - 5.6 % Final    Comment:             Prediabetes: 5.7 - 6.4          Diabetes: >6.4          Glycemic control for adults with diabetes:  <7.0          Passed - eGFR in normal range and within 360 days    EGFR (African American)  Date Value Ref Range Status  07/26/2013 >60  Final   GFR calc Af Amer  Date Value Ref Range Status  11/20/2019 89 >59 mL/min/1.73 Final    Comment:    **In accordance with recommendations from the NKF-ASN Task force,**   [facility] is in the process of updating its eGFR calculation to the   2021 CKD-EPI creatinine equation that estimates kidney function   without a race variable.    EGFR (Non-African Amer.)  Date Value Ref Range Status  07/26/2013 >60  Final    Comment:    eGFR values <16mL/min/1.73 m2 may be an indication of chronic kidney disease (CKD). Calculated eGFR is useful in patients with stable renal function. The eGFR calculation will not be reliable in acutely ill patients when serum creatinine is  changing rapidly. It is not useful in  patients on dialysis. The eGFR calculation may not be applicable to patients at the low and high extremes of body sizes, pregnant women, and vegetarians. POTASSIUM - Slight hemolysis, interpret results with  - caution...tpl    GFR calc non Af Amer  Date Value Ref Range Status  11/20/2019 77 >59 mL/min/1.73 Final   eGFR  Date Value Ref Range Status  04/21/2022 76 >59 mL/min/1.73 Final         Passed - Valid encounter within last 6 months    Recent Outpatient Visits           3 months ago Pre-diabetes   [place] [facility] Malva Limes, MD   6 months ago Coronary artery disease involving native coronary artery of native heart without angina pectoris   [place] [facility] Malva Limes, MD   1 year ago Annual physical exam   [facility] Malva Limes, MD   2 years ago Essential (primary) hypertension   [place] [facility] Malva Limes, MD   3 years ago Aortic atherosclerosis [facility])   [place] [facility] Malva Limes, MD       Future Appointments             In 2 days Fisher, Demetrios Isaacs, MD [facility]

## 2022-11-03 ENCOUNTER — Ambulatory Visit (INDEPENDENT_AMBULATORY_CARE_PROVIDER_SITE_OTHER): Payer: Medicare PPO

## 2022-11-03 VITALS — Ht 66.5 in | Wt 148.0 lb

## 2022-11-03 DIAGNOSIS — Z0000 Encounter for general adult medical examination without abnormal findings: Secondary | ICD-10-CM

## 2022-11-03 NOTE — Patient Instructions (Signed)
Ms. Angela Winters , Thank you for taking time to come for your Medicare Wellness Visit. I appreciate your ongoing commitment to your health goals. Please review the following plan we discussed and let me know if I can assist you in the future.   Referrals/Orders/Follow-Ups/Clinician Recommendations: none  This is a list of the screening recommended for you and due dates:  Health Maintenance  Topic Date Due   Complete foot exam   Never done   Eye exam for diabetics  Never done   Yearly kidney health urinalysis for diabetes  Never done   COVID-19 Vaccine (4 - 2023-24 season) 11/19/2022*   Flu Shot  04/26/2023*   DTaP/Tdap/Td vaccine (2 - Td or Tdap) 11/03/2023*   DEXA scan (bone density measurement)  11/03/2023*   Colon Cancer Screening  11/03/2023*   Hemoglobin A1C  02/03/2023   Yearly kidney function blood test for diabetes  04/21/2023   Screening for Lung Cancer  07/16/2023   Medicare Annual Wellness Visit  11/03/2023   Mammogram  04/12/2024   Pneumonia Vaccine  Completed   Hepatitis C Screening  Completed   Zoster (Shingles) Vaccine  Completed   HPV Vaccine  Aged Out  *Topic was postponed. The date shown is not the original due date.    Advanced directives: (ACP Link)Information on Advanced Care Planning can be found at [facility] Directives Advance Health Care Directives ([email or web address]   Next Medicare Annual Wellness Visit scheduled for next year: Yes  11/09/23 @ 11:05am telephone

## 2022-11-03 NOTE — Progress Notes (Signed)
Subjective:   Angela Winters is a 72 y.o. female who presents for Medicare Annual (Subsequent) preventive examination.  Visit Complete: Virtual I connected with  [name] on 11/03/22 by a audio enabled telemedicine application and verified that I am speaking with the correct person using two identifiers.  Patient Location: Home  Provider Location: Office/Clinic  I discussed the limitations of evaluation and management by telemedicine. The patient expressed understanding and agreed to proceed.  Vital Signs: Because this visit was a virtual/telehealth visit, some criteria may be missing or patient reported. Any vitals not documented were not able to be obtained and vitals that have been documented are patient reported.  Patient Medicare AWV questionnaire was completed by the patient on (not done); I have confirmed that all information answered by patient is correct and no changes since this date. Cardiac Risk Factors include: advanced age (>3men, >62 women);diabetes mellitus;dyslipidemia;hypertension;sedentary lifestyle    Objective:    Today's Vitals   11/03/22 1110  Weight: 148 lb (67.1 kg)  Height: 5' 6.5" (1.689 m)   Body mass index is 23.53 kg/m.     11/03/2022   11:18 AM 03/05/2021   10:29 AM 02/19/2021    7:45 AM 11/20/2019    8:28 AM 08/29/2018   10:06 AM 08/25/2017    2:25 PM 08/21/2016    9:40 AM  Advanced Directives  Does Patient Have a Medical Advance Directive? No  Yes No No No No  Type of Best boy of [place];Living will      Does patient want to make changes to medical advance directive?   No - Patient declined   No - Patient declined   Copy of Healthcare Power of Attorney in Chart?   No - copy requested      Would patient like information on creating a medical advance directive?    No - Patient declined No - Patient declined  No - Patient declined     Information is confidential and restricted. Go to Review Flowsheets to unlock data.     Current Medications (verified) Outpatient Encounter Medications as of 11/03/2022  Medication Sig   aspirin 81 MG tablet Take 81 mg by mouth daily.    cholecalciferol (VITAMIN D) 1000 UNITS tablet VITAMIN D, 1000UNIT (Oral Tablet)  1 Every Day for 0 days  Quantity: 0.00;  Refills: 0   Ordered :09-Jan-2010  Burnell Blanks ;  Started 11-Sep-2008 Active   citalopram (CELEXA) 40 MG tablet TAKE 1 TABLET BY MOUTH EVERY DAY   ezetimibe (ZETIA) 10 MG tablet TAKE 1 TABLET BY MOUTH EVERY DAY   meloxicam (MOBIC) 15 MG tablet TAKE 1 TABLET (15 MG TOTAL) BY MOUTH DAILY. AS NEEDED   metFORMIN (GLUCOPHAGE-XR) 500 MG 24 hr tablet Take 1 tablet (500 mg total) by mouth daily with breakfast.   MULTIPLE VITAMIN PO 1 tablet daily.   rosuvastatin (CRESTOR) 40 MG tablet TAKE 1 TABLET BY MOUTH EVERY DAY   No facility-administered encounter medications on file as of 11/03/2022.    Allergies (verified) Codeine   History: Past Medical History:  Diagnosis Date   Chronic radicular low back pain    Hyperlipidemia    Hypertension    Sciatica of left side    Spinal stenosis at L4-L5 level    Past Surgical History:  Procedure Laterality Date   CATARACT EXTRACTION W/PHACO Right 02/19/2021   Procedure: CATARACT EXTRACTION PHACO AND INTRAOCULAR LENS PLACEMENT (IOC) RIGHT;  Surgeon: Lockie Mola, MD;  [facility];  Service: Ophthalmology;  Laterality: Right;  9.68 01:21.2   CATARACT EXTRACTION W/PHACO Left 03/05/2021   Procedure: CATARACT EXTRACTION PHACO AND INTRAOCULAR LENS PLACEMENT (IOC) LEFT 6.90 01:10.4;  Surgeon: Lockie Mola, MD;  Location: [facility];  Service: Ophthalmology;  Laterality: Left;   REPLACEMENT TOTAL KNEE Left 07/24/2013   Dr. Ernest Pine, [facility]   Family History  Problem Relation Age of Onset   Cancer Mother        Colon Cancer   Heart disease Father    Breast cancer Neg Hx    Social History   Socioeconomic History   Marital status: Married     Spouse name: Not on file   Number of children: 1   Years of education: Not on file   Highest education level: 12th grade  Occupational History   Occupation: retired  Tobacco Use   Smoking status: Every Day    Current packs/day: 0.75    Average packs/day: 0.8 packs/day for 50.0 years (37.5 ttl pk-yrs)    Types: Cigarettes   Smokeless tobacco: Never   Tobacco comments:    Started around age 69  Vaping Use   Vaping status: Never Used  Substance and Sexual Activity   Alcohol use: Yes    Alcohol/week: 2.0 - 3.0 standard drinks of alcohol    Types: 2 - 3 Shots of liquor per week    Comment: occasional   Drug use: No   Sexual activity: Not on file  Other Topics Concern   Not on file  Social History Narrative   Not on file   Social Determinants of Health   Financial Resource Strain: Low Risk  (04/17/2022)   Overall Financial Resource Strain (CARDIA)    Difficulty of Paying Living Expenses: Not hard at all  Food Insecurity: No Food Insecurity (11/03/2022)   Hunger Vital Sign    Worried About Running Out of Food in the Last Year: Never true    Ran Out of Food in the Last Year: Never true  Transportation Needs: No Transportation Needs (11/03/2022)   PRAPARE - Administrator, Civil Service (Medical): No    Lack of Transportation (Non-Medical): No  Physical Activity: Inactive (11/03/2022)   Exercise Vital Sign    Days of Exercise per Week: 0 days    Minutes of Exercise per Session: 0 min  Stress: Stress Concern Present (11/03/2022)   [employer] of Occupational Health - Occupational Stress Questionnaire    Feeling of Stress : Rather much  Social Connections: Moderately Isolated (11/03/2022)   Social Connection and Isolation Panel [NHANES]    Frequency of Communication with Friends and Family: More than three times a week    Frequency of Social Gatherings with Friends and Family: Twice a week    Attends Religious Services: Never    Database administrator or  Organizations: No    Attends Engineer, structural: Never    Marital Status: Married    Tobacco Counseling Ready to quit: Not Answered Counseling given: Not Answered Tobacco comments: Started around age 54   Clinical Intake:  Pre-visit preparation completed: Yes  Pain : No/denies pain     BMI - recorded: 23.53 Nutritional Status: BMI of 19-24  Normal Nutritional Risks: None Diabetes: Yes CBG done?: No Did pt. bring in CBG monitor from home?: No  How often do you need to have someone help you when you read instructions, pamphlets, or other written materials from your doctor or pharmacy?: 1 - Never  Interpreter Needed?: No  Comments: lives with husband Information entered by :: B.Noriah Osgood,LPN   Activities of Daily Living    11/03/2022   11:18 AM 08/03/2022   10:10 AM  In your present state of health, do you have any difficulty performing the following activities:  Hearing? 0 0  Vision? 0 0  Difficulty concentrating or making decisions? 0 0  Walking or climbing stairs? 0 0  Dressing or bathing? 0 0  Doing errands, shopping? 0 0  Preparing Food and eating ? N   Using the Toilet? N   In the past six months, have you accidently leaked urine? N   Do you have problems with loss of bowel control? N   Managing your Medications? N   Managing your Finances? N   Housekeeping or managing your Housekeeping? N     Patient Care Team: Malva Limes, MD as PCP - General (Family Medicine) Debbe Odea, MD as PCP - Cardiology (Cardiology) Isla Pence, OD as Consulting Physician (Optometry)  Indicate any recent Medical Services you may have received from other than [facility] providers in the past year (date may be approximate).     Assessment:   This is a routine wellness examination for [place].  Hearing/Vision screen Hearing Screening - Comments:: Hearing is good per pt Vision Screening - Comments:: Pt says her vision is good;reqaders only Dr Clydene Pugh    Goals Addressed             This Visit's Progress    COMPLETED: Increase water intake       Recommend increasing water intake to 4-6 glasses a day.      Quit Smoking   Not on track      Depression Screen    11/03/2022   11:16 AM 08/03/2022   10:10 AM 04/21/2022   11:26 AM 10/22/2021   10:33 AM 11/20/2019    8:26 AM 08/29/2018   10:06 AM 01/06/2018    9:42 AM  PHQ 2/9 Scores  PHQ - 2 Score 0 0 0 0 0 0 0  PHQ- 9 Score  1 1 0       Fall Risk    11/03/2022   11:12 AM 08/03/2022   10:09 AM 04/21/2022   11:26 AM 10/22/2021   10:32 AM 11/20/2019    8:28 AM  Fall Risk   Falls in the past year? 0 0 0 0 0  Number falls in past yr: 0 0 0 0 0  Injury with Fall? 0 0 0 0 0  Risk for fall due to : No Fall Risks  No Fall Risks No Fall Risks   Follow up Education provided;Falls prevention discussed  Falls evaluation completed Falls evaluation completed     MEDICARE RISK AT HOME: Medicare Risk at Home Any stairs in or around the home?: Yes If so, are there any without handrails?: Yes Home free of loose throw rugs in walkways, pet beds, electrical cords, etc?: Yes Adequate lighting in your home to reduce risk of falls?: Yes Life alert?: No Use of a cane, walker or w/c?: No Grab bars in the bathroom?: No Shower chair or bench in shower?: Yes Elevated toilet seat or a handicapped toilet?: No  TIMED UP AND GO:  Was the test performed?  No    Cognitive Function:        11/03/2022   11:21 AM 11/20/2019    8:33 AM 08/29/2018   10:09 AM 08/25/2017    2:28 PM 08/21/2016  9:42 AM  6CIT Screen  What Year? 0 points 0 points 0 points 0 points 0 points  What month? 0 points 0 points 0 points 0 points 0 points  What time? 0 points 0 points 0 points 0 points 0 points  Count back from 20 0 points 0 points 0 points 0 points 0 points  Months in reverse 0 points 0 points 0 points 0 points 0 points  Repeat phrase 0 points 0 points 0 points 2 points 0 points  Total Score 0 points 0 points 0  points 2 points 0 points    Immunizations Immunization History  Administered Date(s) Administered   Fluad Quad(high Dose 65+) 11/04/2018, 11/20/2019, 10/22/2021   Influenza, High Dose Seasonal PF 11/23/2016, 12/28/2017, 11/22/2020   Influenza,inj,Quad PF,6+ Mos 01/30/2015   PFIZER(Purple Top)SARS-COV-2 Vaccination 03/07/2019, 03/28/2019, 12/12/2019   Pneumococcal Conjugate-13 01/30/2015   Pneumococcal Polysaccharide-23 08/21/2016   Tdap 09/17/2010   Zoster Recombinant(Shingrix) 04/29/2021, 08/07/2021    TDAP status: Up to date  Flu Vaccine status: Due, Education has been provided regarding the importance of this vaccine. Advised may receive this vaccine at local pharmacy or Health Dept. Aware to provide a copy of the vaccination record if obtained from local pharmacy or Health Dept. Verbalized acceptance and understanding.  Pneumococcal vaccine status: Up to date  Covid-19 vaccine status: Completed vaccines  Qualifies for Shingles Vaccine? Yes   Zostavax completed Yes   Shingrix Completed?: Yes  Screening Tests Health Maintenance  Topic Date Due   FOOT EXAM  Never done   OPHTHALMOLOGY EXAM  Never done   Diabetic kidney evaluation - Urine ACR  Never done   COVID-19 Vaccine (4 - 2023-24 season) 11/19/2022 (Originally 09/27/2022)   INFLUENZA VACCINE  04/26/2023 (Originally 08/27/2022)   DTaP/Tdap/Td (2 - Td or Tdap) 11/03/2023 (Originally 09/16/2020)   DEXA SCAN  11/03/2023 (Originally 12/07/2020)   Colonoscopy  11/03/2023 (Originally 07/10/2019)   HEMOGLOBIN A1C  02/03/2023   Diabetic kidney evaluation - eGFR measurement  04/21/2023   Lung Cancer Screening  07/16/2023   Medicare Annual Wellness (AWV)  11/03/2023   MAMMOGRAM  04/12/2024   Pneumonia Vaccine 61+ Years old  Completed   Hepatitis C Screening  Completed   Zoster Vaccines- Shingrix  Completed   HPV VACCINES  Aged Out    Health Maintenance  Health Maintenance Due  Topic Date Due   FOOT EXAM  Never done    OPHTHALMOLOGY EXAM  Never done   Diabetic kidney evaluation - Urine ACR  Never done    Colorectal cancer screening: Type of screening: Colonoscopy. Completed 07/10/2019. Repeat every 5-10 years Pt defers  Mammogram status: Completed 04/13/22. Repeat every year  Bone Density status: Completed 12/07/20. Results reflect: Bone density results: OSTEOPENIA. Repeat every 5 years.  Lung Cancer Screening: (Low Dose CT Chest recommended if Age 70-80 years, 20 pack-year currently smoking OR have quit w/in 15years.) does qualify.   Lung Cancer Screening Referral: has orders/appt every day  Additional Screening:  Hepatitis C Screening: does not qualify; Completed 09/22/2010  Vision Screening: Recommended annual ophthalmology exams for early detection of glaucoma and other disorders of the eye. Is the patient up to date with their annual eye exam?  Yes  Who is the provider or what is the name of the office in which the patient attends annual eye exams? Dr Clydene Pugh If pt is not established with a provider, would they like to be referred to a provider to establish care? No .   Dental Screening: Recommended  annual dental exams for proper oral hygiene  Diabetic Foot Exam: Diabetic Foot Exam: Overdue, Pt has been advised about the importance in completing this exam. Pt is scheduled for diabetic foot exam on 11/04/22 w/PCP visit.  Community Resource Referral / Chronic Care Management: CRR required this visit?  No   CCM required this visit?  No    Plan:     I have personally reviewed and noted the following in the patient's chart:   Medical and social history Use of alcohol, tobacco or illicit drugs  Current medications and supplements including opioid prescriptions. Patient is not currently taking opioid prescriptions. Functional ability and status Nutritional status Physical activity Advanced directives List of other physicians Hospitalizations, surgeries, and ER visits in previous 12  months Vitals Screenings to include cognitive, depression, and falls Referrals and appointments  In addition, I have reviewed and discussed with patient certain preventive protocols, quality metrics, and best practice recommendations. A written personalized care plan for preventive services as well as general preventive health recommendations were provided to patient.    Sue Lush, LPN   28/04/1322   After Visit Summary: (MyChart) Due to this being a telephonic visit, the after visit summary with patients personalized plan was offered to patient via MyChart   Nurse Notes: The patient states she is doing well and has no concerns or questions at this time. She defers her care gaps saying she will address later.

## 2022-11-04 ENCOUNTER — Ambulatory Visit: Payer: Medicare PPO | Admitting: Family Medicine

## 2022-11-04 ENCOUNTER — Other Ambulatory Visit (INDEPENDENT_AMBULATORY_CARE_PROVIDER_SITE_OTHER): Payer: Self-pay | Admitting: Vascular Surgery

## 2022-11-04 VITALS — BP 98/62 | HR 92 | Temp 97.8°F | Resp 16 | Ht 66.5 in | Wt 146.0 lb

## 2022-11-04 DIAGNOSIS — I10 Essential (primary) hypertension: Secondary | ICD-10-CM

## 2022-11-04 DIAGNOSIS — E7800 Pure hypercholesterolemia, unspecified: Secondary | ICD-10-CM

## 2022-11-04 DIAGNOSIS — E119 Type 2 diabetes mellitus without complications: Secondary | ICD-10-CM

## 2022-11-04 DIAGNOSIS — R7989 Other specified abnormal findings of blood chemistry: Secondary | ICD-10-CM | POA: Diagnosis not present

## 2022-11-04 DIAGNOSIS — I6523 Occlusion and stenosis of bilateral carotid arteries: Secondary | ICD-10-CM

## 2022-11-04 DIAGNOSIS — R7401 Elevation of levels of liver transaminase levels: Secondary | ICD-10-CM | POA: Diagnosis not present

## 2022-11-04 DIAGNOSIS — Z7984 Long term (current) use of oral hypoglycemic drugs: Secondary | ICD-10-CM

## 2022-11-04 DIAGNOSIS — Z23 Encounter for immunization: Secondary | ICD-10-CM

## 2022-11-04 NOTE — Progress Notes (Signed)
Established patient visit   Patient: Angela Winters   DOB: [birth year]   72 y.o. Female  MRN: [id number] Visit Date: 11/04/2022  Today's healthcare provider: Mila Merry, MD   Chief Complaint  Patient presents with   Diabetes    Patient was last seen for this on 08/03/22.   Last A1C     6.7 (H)             08/03/2022    She does not check her glucose at home            Subjective    Diabetes Pertinent negatives for hypoglycemia include no dizziness. Pertinent negatives for diabetes include no chest pain, no fatigue and no weakness.   Diabetes Mellitus Type II, Follow-up  Lab Results  Component Value Date   HGBA1C 6.7 (H) 08/03/2022   HGBA1C 6.7 (H) 04/21/2022   HGBA1C 6.4 (H) 10/22/2021   Wt Readings from Last 3 Encounters:  11/04/22 146 lb (66.2 kg)  11/03/22 148 lb (67.1 kg)  10/26/22 148 lb 12.8 oz (67.5 kg)   She reports excellent compliance with treatment. She is not having side effects.  Symptoms: No fatigue No foot ulcerations  No appetite changes No nausea  No paresthesia of the feet  No polydipsia  No polyuria No visual disturbances   No vomiting    Most Recent Eye Exam: LAST YEAR    Pertinent Labs: Lab Results  Component Value Date   CHOL 133 04/21/2022   HDL 62 04/21/2022   LDLCALC 48 04/21/2022   LDLDIRECT 39 04/21/2022   TRIG 133 04/21/2022   CHOLHDL 2.1 04/21/2022   Lab Results  Component Value Date   NA 149 (H) 04/21/2022   K 4.0 04/21/2022   CREATININE 0.82 04/21/2022   EGFR 76 04/21/2022     ---------------------------------------------------------------------------------------------------   Medications: Outpatient Medications Prior to Visit  Medication Sig   aspirin 81 MG tablet Take 81 mg by mouth daily.    cholecalciferol (VITAMIN D) 1000 UNITS tablet VITAMIN D, 1000UNIT (Oral Tablet)  1 Every Day for 0 days  Quantity: 0.00;  Refills: 0   Ordered :09-Jan-2010  Burnell Blanks ;  Started 11-Sep-2008 Active   citalopram  (CELEXA) 40 MG tablet TAKE 1 TABLET BY MOUTH EVERY DAY   ezetimibe (ZETIA) 10 MG tablet TAKE 1 TABLET BY MOUTH EVERY DAY   meloxicam (MOBIC) 15 MG tablet TAKE 1 TABLET (15 MG TOTAL) BY MOUTH DAILY. AS NEEDED   metFORMIN (GLUCOPHAGE-XR) 500 MG 24 hr tablet Take 1 tablet (500 mg total) by mouth daily with breakfast.   MULTIPLE VITAMIN PO 1 tablet daily.   rosuvastatin (CRESTOR) 40 MG tablet TAKE 1 TABLET BY MOUTH EVERY DAY   No facility-administered medications prior to visit.    Review of Systems  Constitutional:  Negative for appetite change, chills, fatigue and fever.  Respiratory:  Negative for chest tightness and shortness of breath.   Cardiovascular:  Negative for chest pain and palpitations.  Gastrointestinal:  Negative for abdominal pain, nausea and vomiting.  Neurological:  Negative for dizziness and weakness.       Objective    BP 98/62 (BP Location: Right Arm, Patient Position: Sitting, Cuff Size: Normal)   Pulse 92   Temp 97.8 F (36.6 C) (Oral)   Resp 16   Ht 5' 6.5" (1.689 m)   Wt 146 lb (66.2 kg)   BMI 23.21 kg/m    Physical Exam   General: Appearance:  Well developed, well nourished female in no acute distress  Eyes:    PERRL, conjunctiva/corneas clear, EOM's intact       Lungs:     Clear to auscultation bilaterally, respirations unlabored  Heart:    Normal heart rate. Normal rhythm.  2/6 systolic murmur   MS:   All extremities are intact.    Neurologic:   Awake, alert, oriented x 3. No apparent focal neurological defect.         Assessment & Plan     1. Type 2 diabetes mellitus without complication, without long-term current use of insulin (HCC) Doing well on daily metformin.   - Hemoglobin A1c - Urine Microalbumin w/creat. ratio  2. Primary hypertension Well controlled.  Continue current medications.    3. Elevated transaminase level  - Hepatic function panel  4. Hypercholesterolemia without hypertriglyceridemia She is tolerating  rosuvastatin and ezetimibe well with no adverse effects.    5. Encounter for immunization  - Flu Vaccine Trivalent High Dose (Fluad)         Mila Merry, MD  [facility] [phone] (phone) [phone] (fax)  [facility]

## 2022-11-05 LAB — HEPATIC FUNCTION PANEL
ALT: 49 [IU]/L — ABNORMAL HIGH (ref 0–32)
AST: 36 [IU]/L (ref 0–40)
Albumin: 4.1 g/dL (ref 3.8–4.8)
Alkaline Phosphatase: 73 [IU]/L (ref 44–121)
Bilirubin Total: 0.3 mg/dL (ref 0.0–1.2)
Bilirubin, Direct: 0.11 mg/dL (ref 0.00–0.40)
Total Protein: 6.4 g/dL (ref 6.0–8.5)

## 2022-11-05 LAB — HEMOGLOBIN A1C
Est. average glucose Bld gHb Est-mCnc: 137 mg/dL
Hgb A1c MFr Bld: 6.4 % — ABNORMAL HIGH (ref 4.8–5.6)

## 2022-11-05 LAB — MICROALBUMIN / CREATININE URINE RATIO
Creatinine, Urine: 168.5 mg/dL
Microalb/Creat Ratio: 42 mg/g{creat} — ABNORMAL HIGH (ref 0–29)
Microalbumin, Urine: 71.1 ug/mL

## 2022-11-09 ENCOUNTER — Ambulatory Visit (INDEPENDENT_AMBULATORY_CARE_PROVIDER_SITE_OTHER): Payer: Medicare PPO | Admitting: Vascular Surgery

## 2022-11-09 ENCOUNTER — Encounter (INDEPENDENT_AMBULATORY_CARE_PROVIDER_SITE_OTHER): Payer: Self-pay | Admitting: Vascular Surgery

## 2022-11-09 ENCOUNTER — Ambulatory Visit (INDEPENDENT_AMBULATORY_CARE_PROVIDER_SITE_OTHER): Payer: Medicare PPO

## 2022-11-09 VITALS — BP 140/91 | HR 99 | Resp 16 | Wt 147.8 lb

## 2022-11-09 DIAGNOSIS — I10 Essential (primary) hypertension: Secondary | ICD-10-CM | POA: Diagnosis not present

## 2022-11-09 DIAGNOSIS — E119 Type 2 diabetes mellitus without complications: Secondary | ICD-10-CM | POA: Diagnosis not present

## 2022-11-09 DIAGNOSIS — I6523 Occlusion and stenosis of bilateral carotid arteries: Secondary | ICD-10-CM

## 2022-11-09 DIAGNOSIS — I739 Peripheral vascular disease, unspecified: Secondary | ICD-10-CM

## 2022-11-09 DIAGNOSIS — E7800 Pure hypercholesterolemia, unspecified: Secondary | ICD-10-CM | POA: Diagnosis not present

## 2022-11-10 ENCOUNTER — Encounter (INDEPENDENT_AMBULATORY_CARE_PROVIDER_SITE_OTHER): Payer: Self-pay | Admitting: Vascular Surgery

## 2022-11-10 DIAGNOSIS — I739 Peripheral vascular disease, unspecified: Secondary | ICD-10-CM | POA: Insufficient documentation

## 2022-11-10 NOTE — Progress Notes (Signed)
MRN : [id number]  Angela Winters is a 72 y.o. (08-May-1950) female who presents with chief complaint of check carotid arteries.  History of Present Illness:   The patient is seen for evaluation of diminished pulses in the left arm. Patient denies pain in the left arm.  She denies lower extremity claudication system.   The patient denies rest pain or dangling of an extremity off the side of the bed during the night for relief. No open wounds or sores at this time. No prior interventions or surgeries.   No history of back problems or DJD of the lumbar sacral spine but she does suffer from DJD ans is s/p left TKR.    The patient denies rest pain symptoms.  No new ulcers or wounds of the left hand or of the foot.   The patient's blood pressure has been stable and relatively well controlled. The patient denies amaurosis fugax or recent TIA symptoms. There are no recent neurological changes noted. The patient denies history of DVT, PE or superficial thrombophlebitis.    Previous ABI 1.22 on the right and 1.23 on the left.  The patient has triphasic tibial artery waveforms bilaterally with good toe waveforms bilaterally.   Today the patient has a carotid artery duplex which reveals RICA 1-39% and LICA 40-59%.  The right vertebral artery has antegrade flow while the left vertebral artery demonstrates bidirectional flow.  Left subclavian steal syndrome is again noted  Current Meds  Medication Sig   aspirin 81 MG tablet Take 81 mg by mouth daily.    cholecalciferol (VITAMIN D) 1000 UNITS tablet VITAMIN D, 1000UNIT (Oral Tablet)  1 Every Day for 0 days  Quantity: 0.00;  Refills: 0   Ordered :09-Jan-2010  Burnell Blanks ;  Started 11-Sep-2008 Active   citalopram (CELEXA) 40 MG tablet TAKE 1 TABLET BY MOUTH EVERY DAY   ezetimibe (ZETIA) 10 MG tablet TAKE 1 TABLET BY MOUTH EVERY DAY   meloxicam (MOBIC) 15 MG tablet TAKE 1 TABLET (15 MG TOTAL) BY MOUTH DAILY. AS NEEDED    metFORMIN (GLUCOPHAGE-XR) 500 MG 24 hr tablet Take 1 tablet (500 mg total) by mouth daily with breakfast.   MULTIPLE VITAMIN PO 1 tablet daily.   rosuvastatin (CRESTOR) 40 MG tablet TAKE 1 TABLET BY MOUTH EVERY DAY    Past Medical History:  Diagnosis Date   Chronic radicular low back pain    Hyperlipidemia    Hypertension    Sciatica of left side    Spinal stenosis at L4-L5 level     Past Surgical History:  Procedure Laterality Date   CATARACT EXTRACTION W/PHACO Right 02/19/2021   Procedure: CATARACT EXTRACTION PHACO AND INTRAOCULAR LENS PLACEMENT (IOC) RIGHT;  Surgeon: Lockie Mola, MD;  Location: [facility];  Service: Ophthalmology;  Laterality: Right;  9.68 01:21.2   CATARACT EXTRACTION W/PHACO Left 03/05/2021   Procedure: CATARACT EXTRACTION PHACO AND INTRAOCULAR LENS PLACEMENT (IOC) LEFT 6.90 01:10.4;  Surgeon: Lockie Mola, MD;  Location: [facility];  Service: Ophthalmology;  Laterality: Left;   REPLACEMENT TOTAL KNEE Left 07/24/2013   Dr. Ernest Pine, [facility]    Social History Social History   Tobacco Use   Smoking status: Every Day    Current packs/day: 0.75    Average packs/day: 0.8 packs/day for 50.0 years (37.5 ttl pk-yrs)    Types: Cigarettes   Smokeless tobacco: Never  Tobacco comments:    Started around age 40  Vaping Use   Vaping status: Never Used  Substance Use Topics   Alcohol use: Yes    Alcohol/week: 2.0 - 3.0 standard drinks of alcohol    Types: 2 - 3 Shots of liquor per week    Comment: occasional   Drug use: No    Family History Family History  Problem Relation Age of Onset   Cancer Mother        Colon Cancer   Heart disease Father    Breast cancer Neg Hx     Allergies  Allergen Reactions   Codeine Nausea Only     REVIEW OF SYSTEMS (Negative unless checked)  Constitutional: [] Weight loss  [] Fever  [] Chills Cardiac: [] Chest pain   [] Chest pressure   [] Palpitations   [] Shortness of breath when laying flat    [] Shortness of breath with exertion. Vascular:  [x] Pain in legs with walking   [] Pain in legs at rest  [] History of DVT   [] Phlebitis   [] Swelling in legs   [] Varicose veins   [] Non-healing ulcers Pulmonary:   [] Uses home oxygen   [] Productive cough   [] Hemoptysis   [] Wheeze  [] COPD   [] Asthma Neurologic:  [] Dizziness   [] Seizures   [] History of stroke   [] History of TIA  [] Aphasia   [] Vissual changes   [] Weakness or numbness in arm   [] Weakness or numbness in leg Musculoskeletal:   [] Joint swelling   [] Joint pain   [] Low back pain Hematologic:  [] Easy bruising  [] Easy bleeding   [] Hypercoagulable state   [] Anemic Gastrointestinal:  [] Diarrhea   [] Vomiting  [] Gastroesophageal reflux/heartburn   [] Difficulty swallowing. Genitourinary:  [] Chronic kidney disease   [] Difficult urination  [] Frequent urination   [] Blood in urine Skin:  [] Rashes   [] Ulcers  Psychological:  [] History of anxiety   []  History of major depression.  Physical Examination  Vitals:   11/09/22 1032  BP: (!) 140/91  Pulse: 99  Resp: 16  Weight: 147 lb 12.8 oz (67 kg)   Body mass index is 23.5 kg/m. Gen: WD/WN, NAD Head: [place]/AT, No temporalis wasting.  Ear/Nose/Throat: Hearing grossly intact, nares w/o erythema or drainage Eyes: PER, EOMI, sclera nonicteric.  Neck: Supple, no masses.  No bruit or JVD.  Pulmonary:  Good air movement, no audible wheezing, no use of accessory muscles.  Cardiac: RRR, normal S1, S2, no Murmurs. Vascular:  carotid bruit noted Vessel Right Left  Radial Palpable Palpable  Carotid  Palpable  Palpable  Subclav  Palpable Palpable  Gastrointestinal: soft, non-distended. No guarding/no peritoneal signs.  Musculoskeletal: M/S 5/5 throughout.  No visible deformity.  Neurologic: CN 2-12 intact. Pain and light touch intact in extremities.  Symmetrical.  Speech is fluent. Motor exam as listed above. Psychiatric: Judgment intact, Mood & affect appropriate for pt's clinical situation. Dermatologic:  No rashes or ulcers noted.  No changes consistent with cellulitis.   CBC Lab Results  Component Value Date   WBC 10.7 04/21/2022   HGB 14.7 08/27/2022   HCT 44.5 08/27/2022   MCV 95 04/21/2022   PLT 202 04/21/2022    BMET    Component Value Date/Time   NA 149 (H) 04/21/2022 1308   NA 135 (L) 07/26/2013 0530   K 4.0 04/21/2022 1308   K 4.4 07/26/2013 0530   CL 107 (H) 04/21/2022 1308   CL 104 07/26/2013 0530   CO2 23 04/21/2022 1308   CO2 25 07/26/2013 0530   GLUCOSE 157 (H)  04/21/2022 1308   GLUCOSE 105 (H) 07/26/2013 0530   BUN 12 04/21/2022 1308   BUN 11 07/26/2013 0530   CREATININE 0.82 04/21/2022 1308   CREATININE 0.60 07/26/2013 0530   CALCIUM 9.7 04/21/2022 1308   CALCIUM 8.1 (L) 07/26/2013 0530   GFRNONAA 77 11/20/2019 1015   GFRNONAA >60 07/26/2013 0530   GFRAA 89 11/20/2019 1015   GFRAA >60 07/26/2013 0530   CrCl cannot be calculated (Patient's most recent lab result is older than the maximum 21 days allowed.).  COAG Lab Results  Component Value Date   INR 0.9 07/12/2013    Radiology MR CARDIAC MORPHOLOGY W WO CONTRAST  Result Date: 10/21/2022 CLINICAL DATA:  Hx of LVH EXAM: CARDIAC MRI TECHNIQUE: The patient was scanned on a 1.5 Tesla Siemens magnet. A dedicated cardiac coil was used. Functional imaging was done using Fiesta sequences. 2,3, and 4 chamber views were done to assess for RWMA's. Modified Simpson's rule using a short axis stack was used to calculate an ejection fraction on a dedicated work [employer]. The patient received 10 cc of Gadavist. After 10 minutes inversion recovery sequences were used to assess for infiltration and scar tissue. Velocity flow mapping performed in the ascending aorta and main pulmonary artery. CONTRAST:  10 cc  of Gadavist FINDINGS: 1. Normal left ventricular size, moderate septal wall thickness (1.4cm), normal systolic function (LVEF = 62%). There are no regional wall motion abnormalities. There is no  LVOT obstruction. There is no late gadolinium enhancement in the left ventricular myocardium. LVEDV: 104 ml LVESV: 39 ml SV: 65 ml CO: 5.2 L/min Myocardial mass: 92g 2. Normal right ventricular size, thickness and systolic function (RVEF = 51%). There are no regional wall motion abnormalities. 3.  Normal left and right atrial size. 4. Normal size of the aortic root, ascending aorta and pulmonary artery. 5.  No significant valvular abnormalities. 6.  Normal pericardium.  No pericardial effusion. IMPRESSION: 1.  Normal LV systolic function.  LVEF 62% 2.  Moderate LV septal hypertrophy, 1.4 cm in max thickness. 3.  No significant valvular abnormalities. 4.  Normal RV function. 5.  No LGE or scar. Electronically Signed   By: Debbe Odea M.D.   On: 10/21/2022 13:52   MR CARDIAC VELOCITY FLOW MAP  Result Date: 10/21/2022 CLINICAL DATA:  Hx of LVH EXAM: CARDIAC MRI TECHNIQUE: The patient was scanned on a 1.5 Tesla Siemens magnet. A dedicated cardiac coil was used. Functional imaging was done using Fiesta sequences. 2,3, and 4 chamber views were done to assess for RWMA's. Modified Simpson's rule using a short axis stack was used to calculate an ejection fraction on a dedicated work [employer], [employer]. The patient received 10 cc of Gadavist. After 10 minutes inversion recovery sequences were used to assess for infiltration and scar tissue. Velocity flow mapping performed in the ascending aorta and main pulmonary artery. CONTRAST:  10 cc  of Gadavist FINDINGS: 1. Normal left ventricular size, moderate septal wall thickness (1.4cm), normal systolic function (LVEF = 62%). There are no regional wall motion abnormalities. There is no LVOT obstruction. There is no late gadolinium enhancement in the left ventricular myocardium. LVEDV: 104 ml LVESV: 39 ml SV: 65 ml CO: 5.2 L/min Myocardial mass: 92g 2. Normal right ventricular size, thickness and systolic function (RVEF = 51%). There are no regional wall motion  abnormalities. 3.  Normal left and right atrial size. 4. Normal size of the aortic root, ascending aorta and pulmonary artery. 5.  No significant valvular abnormalities. 6.  Normal pericardium.  No pericardial effusion. IMPRESSION: 1.  Normal LV systolic function.  LVEF 62% 2.  Moderate LV septal hypertrophy, 1.4 cm in max thickness. 3.  No significant valvular abnormalities. 4.  Normal RV function. 5.  No LGE or scar. Electronically Signed   By: Debbe Odea M.D.   On: 10/21/2022 13:52   MR CARDIAC VELOCITY FLOW MAP  Result Date: 10/21/2022 CLINICAL DATA:  Hx of LVH EXAM: CARDIAC MRI TECHNIQUE: The patient was scanned on a 1.5 Tesla Siemens magnet. A dedicated cardiac coil was used. Functional imaging was done using Fiesta sequences. 2,3, and 4 chamber views were done to assess for RWMA's. Modified Simpson's rule using a short axis stack was used to calculate an ejection fraction on a dedicated work [employer], [employer]. The patient received 10 cc of Gadavist. After 10 minutes inversion recovery sequences were used to assess for infiltration and scar tissue. Velocity flow mapping performed in the ascending aorta and main pulmonary artery. CONTRAST:  10 cc  of Gadavist FINDINGS: 1. Normal left ventricular size, moderate septal wall thickness (1.4cm), normal systolic function (LVEF = 62%). There are no regional wall motion abnormalities. There is no LVOT obstruction. There is no late gadolinium enhancement in the left ventricular myocardium. LVEDV: 104 ml LVESV: 39 ml SV: 65 ml CO: 5.2 L/min Myocardial mass: 92g 2. Normal right ventricular size, thickness and systolic function (RVEF = 51%). There are no regional wall motion abnormalities. 3.  Normal left and right atrial size. 4. Normal size of the aortic root, ascending aorta and pulmonary artery. 5.  No significant valvular abnormalities. 6.  Normal pericardium.  No pericardial effusion. IMPRESSION: 1.  Normal LV systolic function.  LVEF 62% 2.   Moderate LV septal hypertrophy, 1.4 cm in max thickness. 3.  No significant valvular abnormalities. 4.  Normal RV function. 5.  No LGE or scar. Electronically Signed   By: Debbe Odea M.D.   On: 10/21/2022 13:52     Assessment/Plan 1. Carotid atherosclerosis, bilateral Recommend:   Given the patient's asymptomatic subcritical stenosis no further invasive testing or surgery at this time.   Today the patient has a carotid artery duplex which reveals RICA 1-39% and LICA 40-59%.  The right vertebral artery has antegrade flow while the left vertebral artery demonstrates bidirectional flow.  Left subclavian steal syndrome is again noted    There is also noted left subclavian artery stenosis as well however the patient has no signs symptoms of worsening currently.    Continue antiplatelet therapy as prescribed Continue management of CAD, HTN and Hyperlipidemia Healthy heart diet,  encouraged exercise at least 4 times per week.  Follow up in 12 months with duplex ultrasound and physical exam.   - VAS US CAROTID; Future  2. PAD (peripheral artery disease) (HCC)  Recommend:  The patient has evidence of atherosclerosis of the lower extremities with claudication.  The patient does not voice lifestyle limiting changes at this point in time.  Noninvasive studies do not suggest clinically significant change.  No invasive studies, angiography or surgery at this time The patient should continue walking and begin a more formal exercise program.  The patient should continue antiplatelet therapy and aggressive treatment of the lipid abnormalities  No changes in the patient's medications at this time  Continued surveillance is indicated as atherosclerosis is likely to progress with time.    The patient will continue follow up with noninvasive studies as ordered.  3. Primary hypertension Continue antihypertensive medications as already ordered, these medications have been reviewed and there  are no changes at this time.  4. Type 2 diabetes mellitus without complication, without long-term current use of insulin (HCC) Continue hypoglycemic medications as already ordered, these medications have been reviewed and there are no changes at this time.  Hgb A1C to be monitored as already arranged by primary service  5. Hypercholesterolemia without hypertriglyceridemia Continue statin as ordered and reviewed, no changes at this time    Levora Dredge, MD  11/10/2022 8:50 PM

## 2023-01-15 ENCOUNTER — Other Ambulatory Visit: Payer: Self-pay | Admitting: Family Medicine

## 2023-01-15 DIAGNOSIS — E119 Type 2 diabetes mellitus without complications: Secondary | ICD-10-CM

## 2023-01-15 NOTE — Telephone Encounter (Signed)
Requested Prescriptions  Pending Prescriptions Disp Refills   metFORMIN (GLUCOPHAGE-XR) 500 MG 24 hr tablet [Pharmacy Med Name: METFORMIN HCL ER 500 MG TABLET] 90 tablet 0    Sig: TAKE 1 TABLET BY MOUTH EVERY DAY WITH BREAKFAST     Endocrinology:  Diabetes - Biguanides Failed - 01/15/2023 10:45 AM      Failed - B12 Level in normal range and within 720 days    No results found for: "VITAMINB12"       Failed - CBC within normal limits and completed in the last 12 months    WBC  Date Value Ref Range Status  04/21/2022 10.7 3.4 - 10.8 x10E3/uL Final  07/12/2013 9.8 3.6 - 11.0 x10 3/mm 3 Final   RBC  Date Value Ref Range Status  04/21/2022 4.78 3.77 - 5.28 x10E6/uL Final  07/12/2013 4.53 3.80 - 5.20 X10 6/mm 3 Final   Hemoglobin  Date Value Ref Range Status  08/27/2022 14.7 11.1 - 15.9 g/dL Final   Hematocrit  Date Value Ref Range Status  08/27/2022 44.5 34.0 - 46.6 % Final   MCHC  Date Value Ref Range Status  04/21/2022 33.8 31.5 - 35.7 g/dL Final  57/32/2025 42.7 32.0 - 36.0 g/dL Final   [facility]  Date Value Ref Range Status  04/21/2022 32.0 26.6 - 33.0 pg Final  07/12/2013 32.4 26.0 - 34.0 pg Final   MCV  Date Value Ref Range Status  04/21/2022 95 79 - 97 fL Final  07/12/2013 98 80 - 100 fL Final   No results found for: "PLTCOUNTKUC", "LABPLAT", "POCPLA" RDW  Date Value Ref Range Status  04/21/2022 12.9 11.7 - 15.4 % Final  07/12/2013 14.0 11.5 - 14.5 % Final         Passed - Cr in normal range and within 360 days    Creatinine  Date Value Ref Range Status  07/26/2013 0.60 0.60 - 1.30 mg/dL Final   Creatinine, Ser  Date Value Ref Range Status  04/21/2022 0.82 0.57 - 1.00 mg/dL Final         Passed - HBA1C is between 0 and 7.9 and within 180 days    Hgb A1c MFr Bld  Date Value Ref Range Status  11/04/2022 6.4 (H) 4.8 - 5.6 % Final    Comment:             Prediabetes: 5.7 - 6.4          Diabetes: >6.4          Glycemic control for adults with diabetes:  <7.0          Passed - eGFR in normal range and within 360 days    EGFR (African American)  Date Value Ref Range Status  07/26/2013 >60  Final   GFR calc Af Amer  Date Value Ref Range Status  11/20/2019 89 >59 mL/min/1.73 Final    Comment:    **In accordance with recommendations from the NKF-ASN Task force,**   [facility] is in the process of updating its eGFR calculation to the   2021 CKD-EPI creatinine equation that estimates kidney function   without a race variable.    EGFR (Non-African Amer.)  Date Value Ref Range Status  07/26/2013 >60  Final    Comment:    eGFR values <52mL/min/1.73 m2 may be an indication of chronic kidney disease (CKD). Calculated eGFR is useful in patients with stable renal function. The eGFR calculation will not be reliable in acutely ill patients when serum creatinine is  changing rapidly. It is not useful in  patients on dialysis. The eGFR calculation may not be applicable to patients at the low and high extremes of body sizes, pregnant women, and vegetarians. POTASSIUM - Slight hemolysis, interpret results with  - caution...tpl    GFR calc non Af Amer  Date Value Ref Range Status  11/20/2019 77 >59 mL/min/1.73 Final   eGFR  Date Value Ref Range Status  04/21/2022 76 >59 mL/min/1.73 Final         Passed - Valid encounter within last 6 months    Recent Outpatient Visits           2 months ago Type 2 diabetes mellitus without complication, without long-term current use of insulin (HCC)   [facility] [facility] Malva Limes, MD   5 months ago Pre-diabetes   [facility] Malva Limes, MD   8 months ago Coronary artery disease involving native coronary artery of native heart without angina pectoris   [facility] Malva Limes, MD   1 year ago Annual physical exam   [facility] Malva Limes, MD   3 years ago Essential (primary)  hypertension   [place] [facility] Malva Limes, MD

## 2023-01-27 ENCOUNTER — Other Ambulatory Visit: Payer: Self-pay | Admitting: Family Medicine

## 2023-02-16 ENCOUNTER — Other Ambulatory Visit: Payer: Self-pay | Admitting: Family Medicine

## 2023-02-16 ENCOUNTER — Ambulatory Visit: Payer: Medicare PPO | Admitting: Family Medicine

## 2023-02-16 ENCOUNTER — Encounter: Payer: Self-pay | Admitting: Family Medicine

## 2023-02-16 VITALS — BP 110/74 | HR 90 | Ht 66.5 in | Wt 150.0 lb

## 2023-02-16 DIAGNOSIS — E7800 Pure hypercholesterolemia, unspecified: Secondary | ICD-10-CM | POA: Diagnosis not present

## 2023-02-16 DIAGNOSIS — I739 Peripheral vascular disease, unspecified: Secondary | ICD-10-CM

## 2023-02-16 DIAGNOSIS — N3000 Acute cystitis without hematuria: Secondary | ICD-10-CM

## 2023-02-16 DIAGNOSIS — E1169 Type 2 diabetes mellitus with other specified complication: Secondary | ICD-10-CM

## 2023-02-16 DIAGNOSIS — I10 Essential (primary) hypertension: Secondary | ICD-10-CM

## 2023-02-16 DIAGNOSIS — Z0000 Encounter for general adult medical examination without abnormal findings: Secondary | ICD-10-CM

## 2023-02-16 DIAGNOSIS — Z7689 Persons encountering health services in other specified circumstances: Secondary | ICD-10-CM | POA: Diagnosis not present

## 2023-02-16 LAB — POCT URINALYSIS DIPSTICK
Glucose, UA: NEGATIVE
Ketones, UA: NEGATIVE
Nitrite, UA: NEGATIVE
Odor: POSITIVE
Protein, UA: POSITIVE — AB
Spec Grav, UA: 1.015 (ref 1.010–1.025)
Urobilinogen, UA: 0.2 U/dL
pH, UA: 6 (ref 5.0–8.0)

## 2023-02-16 MED ORDER — CEPHALEXIN 500 MG PO CAPS
500.0000 mg | ORAL_CAPSULE | Freq: Three times a day (TID) | ORAL | 0 refills | Status: DC
Start: 2023-02-16 — End: 2023-07-22

## 2023-02-16 NOTE — Patient Instructions (Addendum)
Thank you for coming to the office today.  Reminder to contact our office directly and request medication refills for the first round, until we have ordered them all.  ----------------   1. You have a Urinary Tract Infection - this is very common, your symptoms are reassuring and you should get better within 1 week on the antibiotics - Start Keflex 500mg  3 times daily for next 7 days, complete entire course, even if feeling better - We sent urine for a culture, we will call you within next few days if we need to change antibiotics - Please drink plenty of fluids, improve hydration over next 1 week  If symptoms worsening, developing nausea / vomiting, worsening back pain, fevers / chills / sweats, then please return for re-evaluation sooner.  If you take AZO OTC - limit this to 2-3 days MAX to avoid affecting kidneys  D-Mannose is a natural supplement that can actually help bind to urinary bacteria and reduce their effectiveness it can help prevent UTI from forming, and may reduce some symptoms. It likely cannot cure an active UTI but it is worth a try and good to prevent them with. Try 500mg  twice a day at a full dose if you want, or check package instructions for more info   DUE for FASTING BLOOD WORK (no food or drink after midnight before the lab appointment, only water or coffee without cream/sugar on the morning of)  SCHEDULE "Lab Only" visit in the morning at the clinic for lab draw in 6 MONTHS   - Make sure Lab Only appointment is at about 1 week before your next appointment, so that results will be available  For Lab Results, once available within 2-3 days of blood draw, you can can log in to MyChart online to view your results and a brief explanation. Also, we can discuss results at next follow-up visit.   Please schedule a Follow-up Appointment to: Return for 6 month fasting lab > 1 week later Annual Physical.  If you have any other questions or concerns, please feel free to  call the office or send a message through MyChart. You may also schedule an earlier appointment if necessary.  Additionally, you may be receiving a survey about your experience at our office within a few days to 1 week by e-mail or mail. We value your feedback.  Saralyn Pilar, DO [facility], [place]

## 2023-02-16 NOTE — Progress Notes (Signed)
Subjective:    Patient ID: Angela Winters, female    DOB: 16-Mar-1950, 73 y.o.   MRN: [id number]  Angela Winters is a 73 y.o. female presenting on 02/16/2023 for Diabetes and Hypertension  She prefers to relocate to new PCP closer to home now and due to insurance change. Prior PCP Dr Sherrie Mustache at [facility].  HPI  Discussed the use of AI scribe software for clinical note transcription with the patient, who gave verbal consent to proceed.  History of Present Illness     The patient, a lifelong resident of the area, presents for an initial consultation after switching from a previous provider due to insurance network changes.   Coronary Artery Disease PAD Followed by Vascular for carotid surveillance and Cardiology w/ history heart disease On Aspirin 81mg  daily and current medications BP has been normalized, without medication now. No longer having high blood pressure. Fam history of carotid artery stenosis and PAD  Hyperlipidemia On Zetia and Rosuvastatin 40mg  daily  CHRONIC DM, Type 2: Reports no concerns Meds: Metformin XR 500mg  daily Reports good compliance. Tolerating well w/o side-effects Not on ACEi ARB Denies hypoglycemia, polyuria, visual changes, numbness or tingling.  Possible UTI The patient reports a sensation suggestive of a urinary tract infection over the past four to five days. She describes discomfort and increased frequency of urination, but denies dysuria. She had a similar episode approximately two years ago, which was successfully treated with antibiotics.  Fall / Bruising The patient also reports a recent fall on ice a week ago, resulting in facial bruising and a black eye. She also mentions some soreness in the knee, which had previously undergone replacement surgery. No other injuries or symptoms were reported from the fall. The patient is currently on a low dose of aspirin, which may have contributed to the extent of the bruising.  Smoking /  Tobacco Abuse She reports no issues with medication supply at this time. The patient is a smoker and acknowledges the associated health risks. She has not expressed a current desire to quit.         02/16/2023   10:33 AM 11/04/2022   10:06 AM 11/03/2022   11:16 AM  Depression screen PHQ 2/9  Decreased Interest 0 0 0  Down, Depressed, Hopeless 0 0 0  PHQ - 2 Score 0 0 0  Altered sleeping  0   Tired, decreased energy  0   Change in appetite  0   Feeling bad or failure about yourself   0   Trouble concentrating  0   Moving slowly or fidgety/restless  0   Suicidal thoughts  0   PHQ-9 Score  0   Difficult doing work/chores  Not difficult at all        02/16/2023   10:33 AM 11/04/2022   10:06 AM  GAD 7 : Generalized Anxiety Score  Nervous, Anxious, on Edge 0 0  Control/stop worrying 0 0  Worry too much - different things 2 0  Trouble relaxing 0 0  Restless 0 0  Easily annoyed or irritable 0 0  Afraid - awful might happen 0 0  Total GAD 7 Score 2 0  Anxiety Difficulty Not difficult at all Not difficult at all     Past Medical History:  Diagnosis Date   Chronic radicular low back pain    Hyperlipidemia    Hypertension    Sciatica of left side    Spinal stenosis at L4-L5 level    Past  Surgical History:  Procedure Laterality Date   CATARACT EXTRACTION W/PHACO Right 02/19/2021   Procedure: CATARACT EXTRACTION PHACO AND INTRAOCULAR LENS PLACEMENT (IOC) RIGHT;  Surgeon: Lockie Mola, MD;  Location: [facility];  Service: Ophthalmology;  Laterality: Right;  9.68 01:21.2   CATARACT EXTRACTION W/PHACO Left 03/05/2021   Procedure: CATARACT EXTRACTION PHACO AND INTRAOCULAR LENS PLACEMENT (IOC) LEFT 6.90 01:10.4;  Surgeon: Lockie Mola, MD;  Location: [facility];  Service: Ophthalmology;  Laterality: Left;   REPLACEMENT TOTAL KNEE Left 07/24/2013   Dr. Ernest Pine, [facility]   Social History   Socioeconomic History   Marital status: Married    Spouse name:  Not on file   Number of children: 1   Years of education: Not on file   Highest education level: 12th grade  Occupational History   Occupation: retired  Tobacco Use   Smoking status: Every Day    Current packs/day: 0.75    Average packs/day: 0.8 packs/day for 50.0 years (37.5 ttl pk-yrs)    Types: Cigarettes   Smokeless tobacco: Never   Tobacco comments:    Started around age 48  Vaping Use   Vaping status: Never Used  Substance and Sexual Activity   Alcohol use: Yes    Alcohol/week: 2.0 - 3.0 standard drinks of alcohol    Types: 2 - 3 Shots of liquor per week    Comment: occasional   Drug use: No   Sexual activity: Not on file  Other Topics Concern   Not on file  Social History Narrative   Not on file   Social Drivers of Health   Financial Resource Strain: Low Risk  (04/17/2022)   Overall Financial Resource Strain (CARDIA)    Difficulty of Paying Living Expenses: Not hard at all  Food Insecurity: No Food Insecurity (11/03/2022)   Hunger Vital Sign    Worried About Running Out of Food in the Last Year: Never true    Ran Out of Food in the Last Year: Never true  Transportation Needs: No Transportation Needs (11/03/2022)   PRAPARE - Administrator, Civil Service (Medical): No    Lack of Transportation (Non-Medical): No  Physical Activity: Inactive (11/03/2022)   Exercise Vital Sign    Days of Exercise per Week: 0 days    Minutes of Exercise per Session: 0 min  Stress: Stress Concern Present (11/03/2022)   [employer] of Occupational Health - Occupational Stress Questionnaire    Feeling of Stress : Rather much  Social Connections: Moderately Isolated (11/03/2022)   Social Connection and Isolation Panel [NHANES]    Frequency of Communication with Friends and Family: More than three times a week    Frequency of Social Gatherings with Friends and Family: Twice a week    Attends Religious Services: Never    Database administrator or Organizations: No     Attends Banker Meetings: Never    Marital Status: Married  Catering manager Violence: Not At Risk (11/03/2022)   Humiliation, Afraid, Rape, and Kick questionnaire    Fear of Current or Ex-Partner: No    Emotionally Abused: No    Physically Abused: No    Sexually Abused: No   Family History  Problem Relation Age of Onset   Cancer Mother        Colon Cancer   Heart disease Father    Breast cancer Neg Hx    Current Outpatient Medications on File Prior to Visit  Medication Sig   aspirin 81  MG tablet Take 81 mg by mouth daily.    cholecalciferol (VITAMIN D) 1000 UNITS tablet VITAMIN D, 1000UNIT (Oral Tablet)  1 Every Day for 0 days  Quantity: 0.00;  Refills: 0   Ordered :09-Jan-2010  Burnell Blanks ;  Started 11-Sep-2008 Active   citalopram (CELEXA) 40 MG tablet TAKE 1 TABLET BY MOUTH EVERY DAY   ezetimibe (ZETIA) 10 MG tablet TAKE 1 TABLET BY MOUTH EVERY DAY   meloxicam (MOBIC) 15 MG tablet TAKE 1 TABLET (15 MG TOTAL) BY MOUTH DAILY. AS NEEDED   metFORMIN (GLUCOPHAGE-XR) 500 MG 24 hr tablet TAKE 1 TABLET BY MOUTH EVERY DAY WITH BREAKFAST   MULTIPLE VITAMIN PO 1 tablet daily.   rosuvastatin (CRESTOR) 40 MG tablet TAKE 1 TABLET BY MOUTH EVERY DAY   No current facility-administered medications on file prior to visit.    Review of Systems Per HPI unless specifically indicated above     Objective:    BP 110/74   Pulse 90   Ht 5' 6.5" (1.689 m)   Wt 150 lb (68 kg)   BMI 23.85 kg/m   Wt Readings from Last 3 Encounters:  02/16/23 150 lb (68 kg)  11/09/22 147 lb 12.8 oz (67 kg)  11/04/22 146 lb (66.2 kg)    Physical Exam Vitals and nursing note reviewed.  Constitutional:      General: She is not in acute distress.    Appearance: Normal appearance. She is well-developed. She is not diaphoretic.     Comments: Well-appearing, comfortable, cooperative  HENT:     Head: Normocephalic and atraumatic.  Eyes:     General:        Right eye: No discharge.        Left  eye: No discharge.     Conjunctiva/sclera: Conjunctivae normal.  Cardiovascular:     Rate and Rhythm: Normal rate.  Pulmonary:     Effort: Pulmonary effort is normal.  Musculoskeletal:     Comments: L Knee s/p TKR, full range of motion.  Skin:    General: Skin is warm and dry.     Findings: Bruising (Left cheek and face gravity dependent changes, resolving.) present. No erythema or rash.  Neurological:     Mental Status: She is alert and oriented to person, place, and time.  Psychiatric:        Mood and Affect: Mood normal.        Behavior: Behavior normal.        Thought Content: Thought content normal.     Comments: Well groomed, good eye contact, normal speech and thoughts     I have personally reviewed the radiology report from 07/16/22 on LDCT.   CLINICAL DATA:  73 year old female current smoker presents for three-month follow-up.   EXAM: CT CHEST WITHOUT CONTRAST FOR LUNG CANCER SCREENING NODULE FOLLOW-UP   TECHNIQUE: Multidetector CT imaging of the chest was performed following the standard protocol without IV contrast.   RADIATION DOSE REDUCTION: This exam was performed according to the departmental dose-optimization program which includes automated exposure control, adjustment of the mA and/or kV according to patient size and/or use of iterative reconstruction technique.   COMPARISON:  04/15/2022 screening chest CT.   FINDINGS: Cardiovascular: Normal heart size. No significant pericardial effusion/thickening. Three-vessel coronary atherosclerosis. Atherosclerotic nonaneurysmal thoracic aorta. Normal caliber pulmonary arteries.   Mediastinum/Nodes: No significant thyroid nodules. Unremarkable esophagus. No pathologically enlarged axillary, mediastinal or hilar lymph nodes, noting limited sensitivity for the detection of hilar adenopathy on this noncontrast study.  Lungs/Pleura: No pneumothorax. No pleural effusion. Mild centrilobular emphysema with diffuse  bronchial wall thickening. No acute consolidative airspace disease or lung masses. No significant growth of previously visualized pulmonary nodules. Previously described tiny left mainstem bronchus nodular focus is absent on today's scan compatible with resolved mucoid debris. No new significant pulmonary nodules. New patchy tree-in-bud opacity, ground-glass opacity, curvilinear thick parenchymal bands and patchy mucoid impaction in the right middle lobe and basilar left lower lobe compatible with mild bronchopneumonia.   Upper abdomen: Left adrenal 2.5 cm nodule with density 6 HU, stable, compatible with an adenoma.   Musculoskeletal: No aggressive appearing focal osseous lesions. Mild lower thoracic spondylosis.   IMPRESSION: 1. New patchy tree-in-bud opacity, ground-glass opacity, curvilinear thick parenchymal bands and patchy mucoid impaction in the right middle lobe and basilar left lower lobe compatible with mild multilobar bronchopneumonia. 2. Lung-RADS 2, benign appearance or behavior. Continue annual screening with low-dose chest CT without contrast in 12 months. 3. Three-vessel coronary atherosclerosis. 4. Stable left adrenal adenoma, for which no imaging follow-up is recommended. 5. Aortic Atherosclerosis (ICD10-I70.0) and Emphysema (ICD10-J43.9).   These results will be called to the ordering clinician or representative by the Radiologist Assistant, and communication documented in the PACS or [employer].     Electronically Signed   By: Delbert Phenix M.D.   On: 07/21/2022 10:51  Results for orders placed or performed in visit on 02/16/23  POCT urinalysis dipstick   Collection Time: 02/16/23 11:51 AM  Result Value Ref Range   Color, UA yellow    Clarity, UA cloudy    Glucose, UA Negative Negative   Bilirubin, UA small    Ketones, UA negative    Spec Grav, UA 1.015 1.010 - 1.025   Blood, UA small    pH, UA 6.0 5.0 - 8.0   Protein, UA Positive (A)  Negative   Urobilinogen, UA 0.2 0.2 or 1.0 E.U./dL   Nitrite, UA negative    Leukocytes, UA Moderate (2+) (A) Negative   Appearance     Odor positive       Assessment & Plan:   Problem List Items Addressed This Visit     Hypercholesterolemia without hypertriglyceridemia   PAD (peripheral artery disease) (HCC)   Primary hypertension   Type 2 diabetes mellitus with other specified complication (HCC) - Primary   Other Visit Diagnoses       Encounter to establish care with new doctor         Acute cystitis without hematuria       Relevant Medications   cephALEXin (KEFLEX) 500 MG capsule   Other Relevant Orders   POCT urinalysis dipstick (Completed)   Urine Culture        Possible Urinary Tract Infection Reports discomfort and increased frequency of urination. No burning sensation. History of mild UTI two years ago. -Collect urine sample for testing. POC Dipstick + Urine Culture Urine dipstick looks suggestive of UTI -Prescribe Keflex 500mg  TID as a precautionary measure.  Hypertension Reports blood pressure has been low recently. No current symptoms of hypotension. Remains off BP medication  Smoking, tobacco abuse Continues to smoke. Undergoing annual LDCT scans for surveillance. -Encourage smoking cessation. She is not ready today. -Continue annual CT scans.  Medication Management -Advise patient to contact our office directly for medication refills once current refills under Dr. Sherrie Mustache are exhausted. -Continue Metformin and Aspirin 81mg .  Fall with Facial Bruising Reports fall on ice a week ago resulting in significant facial bruising and minor  knee discomfort. No severe headaches, loss of vision, numbness, or speech difficulties. Improving -Advise to monitor symptoms and report any changes.  General Health Maintenance -Schedule follow-up appointment in six months for full blood panel. -Continue annual mammograms, due in spring. -Continue surveillance of carotid  arteries due to family history of stroke.         Orders Placed This Encounter  Procedures   Urine Culture   POCT urinalysis dipstick    Meds ordered this encounter  Medications   cephALEXin (KEFLEX) 500 MG capsule    Sig: Take 1 capsule (500 mg total) by mouth 3 (three) times daily. For 7 days    Dispense:  21 capsule    Refill:  0     Follow up plan: Return for 6 month fasting lab > 1 week later Annual Physical.  Future labs 08/19/23  Saralyn Pilar, DO [facility] 02/16/2023, 10:59 AM

## 2023-02-18 ENCOUNTER — Ambulatory Visit: Payer: Self-pay | Admitting: *Deleted

## 2023-02-18 ENCOUNTER — Encounter: Payer: Self-pay | Admitting: Family Medicine

## 2023-02-18 LAB — URINE CULTURE
MICRO NUMBER:: 15982083
SPECIMEN QUALITY:: ADEQUATE

## 2023-02-18 NOTE — Telephone Encounter (Signed)
Reason for Disposition  [1] Follow-up call to recent contact AND [2] information only call, no triage required  Answer Assessment - Initial Assessment Questions 1. REASON FOR CALL or QUESTION: "What is your reason for calling today?" or "How can I best help you?" or "What question do you have that I can help answer?"     Pt returned call and was given the message from Dr. Althea Charon from the MyChart message.   She did get the message and picked up the Keflex and started it last night. Verbalized understanding to call us if she did not improve or the symptoms went away and then returned.  Protocols used: Information Only Call - No Triage-A-AH

## 2023-02-18 NOTE — Telephone Encounter (Signed)
No further action needed.

## 2023-02-18 NOTE — Telephone Encounter (Signed)
  Chief Complaint: Pt returned call and was given the message from Dr. Althea Charon regarding her urine culture.   (Unable to chart on that encounter so had to create a new en counter).   She did get the MyChart message and started the Keflex last night. Symptoms: N/A Frequency: N/A Pertinent Negatives: Patient denies N/A Disposition: [] ED /[] Urgent Care (no appt availability in office) / [] Appointment(In office/virtual)/ []  [facility]/ [] Home Care/ [] Refused Recommended Disposition /[] [facility]/ [x]  Follow-up with PCP Additional Notes: Message sent to Dr. Althea Charon letting him know she got his MyChart message.

## 2023-03-02 ENCOUNTER — Ambulatory Visit: Payer: Self-pay

## 2023-03-02 NOTE — Telephone Encounter (Signed)
 Message from [place] [facility] P sent at 03/02/2023  9:16 AM EST  Summary: poss uti   Pt was in on Jan 21st as a new pt and had a urinary track inf.  She gave her medication but she is still having some symptoms.  Please advise..[phone]         Chief Complaint: sx returned 3 days ago Symptoms: bladder pressure, frequency Frequency: 3 days  Pertinent Negatives: Patient denies fever, burning with urination Disposition: [] ED /[] Urgent Care (no appt availability in office) / [] Appointment(In office/virtual)/ []  [facility]/ [] Home Care/ [] Refused Recommended Disposition /[] [facility] [place] [facility]/ [x]  Follow-up with PCP Additional Notes: Per OV note pt to call back if having sx or no improvement  Reason for Disposition  Urinating more frequently than usual (i.e., frequency)  Answer Assessment - Initial Assessment Questions 1. SYMPTOM: What's the main symptom you're concerned about? (e.g., frequency, incontinence)     pressure 2. ONSET: When did the  sx  start?     Few days  3. PAIN: Is there any pain? If Yes, ask: How bad is it? (Scale: 1-10; mild, moderate, severe)     moderate 4. CAUSE: What do you think is causing the symptoms?     UTI  5. OTHER SYMPTOMS: Do you have any other symptoms? (e.g., blood in urine, fever, flank pain, pain with urination)     Very yellow frequency- feels like has to pee all the time  Protocols used: Urinary Symptoms-A-AH

## 2023-03-02 NOTE — Telephone Encounter (Signed)
Follow up appt made for 03/04/23

## 2023-03-04 ENCOUNTER — Encounter: Payer: Self-pay | Admitting: Family Medicine

## 2023-03-04 ENCOUNTER — Ambulatory Visit: Payer: Medicare PPO | Admitting: Family Medicine

## 2023-03-04 VITALS — BP 112/68 | HR 90 | Ht 66.5 in | Wt 152.0 lb

## 2023-03-04 DIAGNOSIS — R300 Dysuria: Secondary | ICD-10-CM | POA: Diagnosis not present

## 2023-03-04 DIAGNOSIS — N3001 Acute cystitis with hematuria: Secondary | ICD-10-CM | POA: Diagnosis not present

## 2023-03-04 LAB — POCT URINALYSIS DIPSTICK
Bilirubin, UA: NEGATIVE
Glucose, UA: NEGATIVE
Ketones, UA: NEGATIVE
Nitrite, UA: NEGATIVE
Odor: POSITIVE
Protein, UA: NEGATIVE
Spec Grav, UA: <=1.005 — AB (ref 1.010–1.025)
Urobilinogen, UA: 0.2 U/dL
pH, UA: 6 (ref 5.0–8.0)

## 2023-03-04 MED ORDER — D-MANNOSE 500 MG PO CAPS: 500.0000 mg | ORAL_CAPSULE | Freq: Two times a day (BID) | ORAL | Status: DC

## 2023-03-04 MED ORDER — SULFAMETHOXAZOLE-TRIMETHOPRIM 800-160 MG PO TABS: 1.0000 | ORAL_TABLET | Freq: Two times a day (BID) | ORAL | 0 refills | Status: AC

## 2023-03-04 NOTE — Progress Notes (Signed)
 Subjective:    Patient ID: Angela Winters, female    DOB: [birth year], 73 y.o.   MRN: [id number]  Angela Winters is a 73 y.o. female presenting on 03/04/2023 for Urinary Tract Infection   HPI  Discussed the use of AI scribe software for clinical note transcription with the patient, who gave verbal consent to proceed.  History of Present Illness    Angela Winters is a 73 year old female who presents with persistent urinary symptoms suggestive of a urinary tract infection.  She has been experiencing persistent urinary symptoms for over two weeks, characterized by pressure and discomfort without a burning sensation. There is increased frequency of urination.   Last visit 02/16/23. Initial treatment with Keflex  (cephalexin ) three times a day for seven days provided some relief but did not completely resolve the symptoms. A urine culture performed two weeks ago confirmed the presence of E. coli.   Despite treatment, symptoms have persisted but have not worsened, described as 'hanging around.' No difficulty with urination such as incomplete emptying or difficulty starting urination. Increasing water intake seems to improve symptoms, but reducing water consumption leads to symptom persistence.        02/16/2023   10:33 AM 11/04/2022   10:06 AM 11/03/2022   11:16 AM  Depression screen PHQ 2/9  Decreased Interest 0 0 0  Down, Depressed, Hopeless 0 0 0  PHQ - 2 Score 0 0 0  Altered sleeping  0   Tired, decreased energy  0   Change in appetite  0   Feeling bad or failure about yourself   0   Trouble concentrating  0   Moving slowly or fidgety/restless  0   Suicidal thoughts  0   PHQ-9 Score  0   Difficult doing work/chores  Not difficult at all        02/16/2023   10:33 AM 11/04/2022   10:06 AM  GAD 7 : Generalized Anxiety Score  Nervous, Anxious, on Edge 0 0  Control/stop worrying 0 0  Worry too much - different things 2 0  Trouble relaxing 0 0  Restless 0 0  Easily annoyed or irritable  0 0  Afraid - awful might happen 0 0  Total GAD 7 Score 2 0  Anxiety Difficulty Not difficult at all Not difficult at all    Social History   Tobacco Use   Smoking status: Every Day    Current packs/day: 0.75    Average packs/day: 0.8 packs/day for 50.0 years (37.5 ttl pk-yrs)    Types: Cigarettes   Smokeless tobacco: Never   Tobacco comments:    Started around age 56  Vaping Use   Vaping status: Never Used  Substance Use Topics   Alcohol use: Yes    Alcohol/week: 2.0 - 3.0 standard drinks of alcohol    Types: 2 - 3 Shots of liquor per week    Comment: occasional   Drug use: No    Review of Systems Per HPI unless specifically indicated above     Objective:    BP 112/68   Pulse 90   Ht 5' 6.5 (1.689 m)   Wt 152 lb (68.9 kg)   BMI 24.17 kg/m   Wt Readings from Last 3 Encounters:  03/04/23 152 lb (68.9 kg)  02/16/23 150 lb (68 kg)  11/09/22 147 lb 12.8 oz (67 kg)    Physical Exam Vitals and nursing note reviewed.  Constitutional:      General: She is not in  acute distress.    Appearance: Normal appearance. She is well-developed. She is not diaphoretic.     Comments: Well-appearing, comfortable, cooperative  HENT:     Head: Normocephalic and atraumatic.  Eyes:     General:        Right eye: No discharge.        Left eye: No discharge.     Conjunctiva/sclera: Conjunctivae normal.  Cardiovascular:     Rate and Rhythm: Normal rate.  Pulmonary:     Effort: Pulmonary effort is normal.  Skin:    General: Skin is warm and dry.     Findings: No erythema or rash.  Neurological:     Mental Status: She is alert and oriented to person, place, and time.  Psychiatric:        Mood and Affect: Mood normal.        Behavior: Behavior normal.        Thought Content: Thought content normal.     Comments: Well groomed, good eye contact, normal speech and thoughts       Results for orders placed or performed in visit on 03/04/23  POCT Urinalysis Dipstick    Collection Time: 03/04/23 10:55 AM  Result Value Ref Range   Color, UA yellow    Clarity, UA clear    Glucose, UA Negative Negative   Bilirubin, UA negative    Ketones, UA negative    Spec Grav, UA <=1.005 (A) 1.010 - 1.025   Blood, UA moderate    pH, UA 6.0 5.0 - 8.0   Protein, UA Negative Negative   Urobilinogen, UA 0.2 0.2 or 1.0 E.U./dL   Nitrite, UA negative    Leukocytes, UA Moderate (2+) (A) Negative   Appearance     Odor positive       Assessment & Plan:   Problem List Items Addressed This Visit   None Visit Diagnoses       Acute cystitis with hematuria    -  Primary   Relevant Medications   sulfamethoxazole -trimethoprim  (BACTRIM  DS) 800-160 MG tablet   Other Relevant Orders   Urine Culture     Dysuria       Relevant Orders   POCT Urinalysis Dipstick (Completed)        Urinary Tract Infection (UTI)  Persistent symptoms of urinary frequency and discomfort despite recent treatment with Keflex . Urinalysis suggestive of ongoing infection. Previous culture showed E. Coli sensitive to Keflex .  -Order urine culture and sensitivity. -Start Bactrim  twice daily for 7 days. -Advise patient to increase water intake and try double voiding technique to ensure complete bladder emptying. -Recommend D-Mannose supplement twice daily for UTI prevention. -If symptoms persist or worsen, consider referral to Urology.         Orders Placed This Encounter  Procedures   Urine Culture   POCT Urinalysis Dipstick    Meds ordered this encounter  Medications   D-Mannose 500 MG CAPS    Sig: Take 500 mg by mouth 2 (two) times daily.   sulfamethoxazole -trimethoprim  (BACTRIM  DS) 800-160 MG tablet    Sig: Take 1 tablet by mouth 2 (two) times daily for 7 days.    Dispense:  14 tablet    Refill:  0    Follow up plan: Return if symptoms worsen or fail to improve.    Marsa Officer, DO [facility] 03/04/2023, 11:07 AM

## 2023-03-04 NOTE — Patient Instructions (Addendum)
 Thank you for coming to the office today.  1. You have a Urinary Tract Infection - this is very common, your symptoms are reassuring and you should get better within 1 week on the antibiotics - Start Bactrim  2 times daily for next 7 days, complete entire course, even if feeling better - We sent urine for a culture, we will call you within next few days if we need to change antibiotics - Please drink plenty of fluids, improve hydration over next 1 week  If symptoms worsening, developing nausea / vomiting, worsening back pain, fevers / chills / sweats, then please return for re-evaluation sooner.  Avoid AZO  D-Mannose is a natural supplement that can actually help bind to urinary bacteria and reduce their effectiveness it can help prevent UTI from forming, and may reduce some symptoms. It likely cannot cure an active UTI but it is worth a try and good to prevent them with. Try 500mg  twice a day at a full dose if you want, or check package instructions for more info   Please schedule a Follow-up Appointment to: Return if symptoms worsen or fail to improve.  If you have any other questions or concerns, please feel free to call the office or send a message through MyChart. You may also schedule an earlier appointment if necessary.  Additionally, you may be receiving a survey about your experience at our office within a few days to 1 week by e-mail or mail. We value your feedback.  Marsa Officer, DO [place], [place]

## 2023-03-05 LAB — URINE CULTURE
MICRO NUMBER:: 16052259
SPECIMEN QUALITY:: ADEQUATE

## 2023-03-08 ENCOUNTER — Encounter: Payer: Self-pay | Admitting: Family Medicine

## 2023-04-09 ENCOUNTER — Other Ambulatory Visit: Payer: Self-pay | Admitting: Family Medicine

## 2023-04-13 ENCOUNTER — Other Ambulatory Visit: Payer: Self-pay | Admitting: Family Medicine

## 2023-04-13 DIAGNOSIS — E119 Type 2 diabetes mellitus without complications: Secondary | ICD-10-CM

## 2023-06-07 ENCOUNTER — Other Ambulatory Visit: Payer: Self-pay | Admitting: Acute Care

## 2023-06-07 ENCOUNTER — Encounter: Payer: Self-pay | Admitting: Family Medicine

## 2023-06-07 ENCOUNTER — Other Ambulatory Visit: Payer: Self-pay | Admitting: Family Medicine

## 2023-06-07 DIAGNOSIS — Z122 Encounter for screening for malignant neoplasm of respiratory organs: Secondary | ICD-10-CM

## 2023-06-07 DIAGNOSIS — Z1231 Encounter for screening mammogram for malignant neoplasm of breast: Secondary | ICD-10-CM

## 2023-06-07 DIAGNOSIS — Z87891 Personal history of nicotine dependence: Secondary | ICD-10-CM

## 2023-06-07 DIAGNOSIS — F17210 Nicotine dependence, cigarettes, uncomplicated: Secondary | ICD-10-CM

## 2023-06-15 ENCOUNTER — Encounter (INDEPENDENT_AMBULATORY_CARE_PROVIDER_SITE_OTHER): Payer: Self-pay

## 2023-06-22 ENCOUNTER — Ambulatory Visit
Admission: RE | Admit: 2023-06-22 | Discharge: 2023-06-22 | Disposition: A | Discharge status: Home / Self Care | Source: Ambulatory Visit | Attending: Family Medicine | Admitting: Family Medicine

## 2023-06-22 DIAGNOSIS — Z1231 Encounter for screening mammogram for malignant neoplasm of breast: Secondary | ICD-10-CM | POA: Diagnosis present

## 2023-07-07 ENCOUNTER — Other Ambulatory Visit: Payer: Self-pay | Admitting: Family Medicine

## 2023-07-07 DIAGNOSIS — E7800 Pure hypercholesterolemia, unspecified: Secondary | ICD-10-CM

## 2023-07-12 ENCOUNTER — Other Ambulatory Visit: Payer: Self-pay | Admitting: Family Medicine

## 2023-07-12 DIAGNOSIS — E119 Type 2 diabetes mellitus without complications: Secondary | ICD-10-CM

## 2023-07-17 ENCOUNTER — Other Ambulatory Visit: Payer: Self-pay | Admitting: Family Medicine

## 2023-07-19 ENCOUNTER — Other Ambulatory Visit: Payer: Self-pay | Admitting: Family Medicine

## 2023-07-19 DIAGNOSIS — E119 Type 2 diabetes mellitus without complications: Secondary | ICD-10-CM

## 2023-07-21 ENCOUNTER — Other Ambulatory Visit: Payer: Self-pay | Admitting: Family Medicine

## 2023-07-21 DIAGNOSIS — F419 Anxiety disorder, unspecified: Secondary | ICD-10-CM

## 2023-07-21 NOTE — Telephone Encounter (Signed)
 PCP listed in chart is Dr. Edman at [facility].  Requested Prescriptions  Pending Prescriptions Disp Refills   metFORMIN  (GLUCOPHAGE -XR) 500 MG 24 hr tablet [Pharmacy Med Name: METFORMIN  HCL ER 500 MG TABLET] 90 tablet 0    Sig: TAKE 1 TABLET BY MOUTH EVERY DAY WITH BREAKFAST     Endocrinology:  Diabetes - Biguanides Failed - 07/21/2023  8:38 AM      Failed - Cr in normal range and within 360 days    Creatinine  Date Value Ref Range Status  07/26/2013 0.60 0.60 - 1.30 mg/dL Final   Creatinine, Ser  Date Value Ref Range Status  04/21/2022 0.82 0.57 - 1.00 mg/dL Final         Failed - HBA1C is between 0 and 7.9 and within 180 days    Hgb A1c MFr Bld  Date Value Ref Range Status  11/04/2022 6.4 (H) 4.8 - 5.6 % Final    Comment:             Prediabetes: 5.7 - 6.4          Diabetes: >6.4          Glycemic control for adults with diabetes: <7.0          Failed - eGFR in normal range and within 360 days    EGFR (African American)  Date Value Ref Range Status  07/26/2013 >60  Final   GFR calc Af Amer  Date Value Ref Range Status  11/20/2019 89 >59 mL/min/1.73 Final    Comment:    **In accordance with recommendations from the NKF-ASN Task force,**   [facility] is in the process of updating its eGFR calculation to the   2021 CKD-EPI creatinine equation that estimates kidney function   without a race variable.    EGFR (Non-African Amer.)  Date Value Ref Range Status  07/26/2013 >60  Final    Comment:    eGFR values <45mL/min/1.73 m2 may be an indication of chronic kidney disease (CKD). Calculated eGFR is useful in patients with stable renal function. The eGFR calculation will not be reliable in acutely ill patients when serum creatinine is changing rapidly. It is not useful in  patients on dialysis. The eGFR calculation may not be applicable to patients at the low and high extremes of body sizes, pregnant women, and vegetarians. POTASSIUM - Slight hemolysis, interpret  results with  - caution...tpl    GFR calc non Af Amer  Date Value Ref Range Status  11/20/2019 77 >59 mL/min/1.73 Final   eGFR  Date Value Ref Range Status  04/21/2022 76 >59 mL/min/1.73 Final         Failed - B12 Level in normal range and within 720 days    No results found for: VITAMINB12       Failed - CBC within normal limits and completed in the last 12 months    WBC  Date Value Ref Range Status  04/21/2022 10.7 3.4 - 10.8 x10E3/uL Final  07/12/2013 9.8 3.6 - 11.0 x10 3/mm 3 Final   RBC  Date Value Ref Range Status  04/21/2022 4.78 3.77 - 5.28 x10E6/uL Final  07/12/2013 4.53 3.80 - 5.20 X10 6/mm 3 Final   Hemoglobin  Date Value Ref Range Status  08/27/2022 14.7 11.1 - 15.9 g/dL Final   Hematocrit  Date Value Ref Range Status  08/27/2022 44.5 34.0 - 46.6 % Final   MCHC  Date Value Ref Range Status  04/21/2022 33.8 31.5 - 35.7 g/dL Final  07/12/2013 33.0 32.0 - 36.0 g/dL Final   [facility]  Date Value Ref Range Status  04/21/2022 32.0 26.6 - 33.0 pg Final  07/12/2013 32.4 26.0 - 34.0 pg Final   MCV  Date Value Ref Range Status  04/21/2022 95 79 - 97 fL Final  07/12/2013 98 80 - 100 fL Final   No results found for: PLTCOUNTKUC, LABPLAT, POCPLA RDW  Date Value Ref Range Status  04/21/2022 12.9 11.7 - 15.4 % Final  07/12/2013 14.0 11.5 - 14.5 % Final         Passed - Valid encounter within last 6 months    Recent Outpatient Visits           4 months ago Acute cystitis with hematuria   [facility] Edman Marsa PARAS, DO       Future Appointments             In 1 month Edman, Marsa PARAS, DO [facility], [facility]

## 2023-07-22 ENCOUNTER — Ambulatory Visit
Admission: RE | Admit: 2023-07-22 | Discharge: 2023-07-22 | Disposition: A | Discharge status: Home / Self Care | Source: Ambulatory Visit | Attending: Acute Care | Admitting: Acute Care

## 2023-07-22 DIAGNOSIS — F17210 Nicotine dependence, cigarettes, uncomplicated: Secondary | ICD-10-CM | POA: Insufficient documentation

## 2023-07-22 DIAGNOSIS — Z122 Encounter for screening for malignant neoplasm of respiratory organs: Secondary | ICD-10-CM | POA: Diagnosis present

## 2023-07-22 DIAGNOSIS — Z87891 Personal history of nicotine dependence: Secondary | ICD-10-CM

## 2023-07-22 DIAGNOSIS — J439 Emphysema, unspecified: Secondary | ICD-10-CM | POA: Insufficient documentation

## 2023-07-22 DIAGNOSIS — I700 Atherosclerosis of aorta: Secondary | ICD-10-CM | POA: Diagnosis not present

## 2023-07-22 NOTE — Telephone Encounter (Signed)
 Requested medications are due for refill today.  yes  Requested medications are on the active medications list.  yes  Last refill. 01/30/2023 #90 1 rf  Future visit scheduled.   yes  Notes to clinic.  Unsure who PCP is. Pt has seen both Dr. Edman and Dr. Gasper.    Requested Prescriptions  Pending Prescriptions Disp Refills   citalopram  (CELEXA ) 40 MG tablet [Pharmacy Med Name: CITALOPRAM  HBR 40 MG TABLET] 90 tablet 1    Sig: TAKE 1 TABLET BY MOUTH EVERY DAY     Psychiatry:  Antidepressants - SSRI Passed - 07/22/2023  3:26 PM      Passed - Valid encounter within last 6 months    Recent Outpatient Visits           4 months ago Acute cystitis with hematuria   [facility] [place], Marsa PARAS, DO       Future Appointments             In 1 month Edman, Marsa PARAS, DO [facility], [facility]

## 2023-07-22 NOTE — Telephone Encounter (Signed)
 She transferred care from Dr Gasper to our office. She is my patient now, at [facility].  Marsa Officer, DO [facility] 07/22/2023, 4:28 PM

## 2023-08-02 ENCOUNTER — Other Ambulatory Visit: Payer: Self-pay

## 2023-08-02 DIAGNOSIS — Z122 Encounter for screening for malignant neoplasm of respiratory organs: Secondary | ICD-10-CM

## 2023-08-02 DIAGNOSIS — F17210 Nicotine dependence, cigarettes, uncomplicated: Secondary | ICD-10-CM

## 2023-08-02 DIAGNOSIS — Z87891 Personal history of nicotine dependence: Secondary | ICD-10-CM

## 2023-08-19 ENCOUNTER — Other Ambulatory Visit: Payer: Self-pay

## 2023-08-19 DIAGNOSIS — I10 Essential (primary) hypertension: Secondary | ICD-10-CM

## 2023-08-19 DIAGNOSIS — I739 Peripheral vascular disease, unspecified: Secondary | ICD-10-CM

## 2023-08-19 DIAGNOSIS — Z0000 Encounter for general adult medical examination without abnormal findings: Secondary | ICD-10-CM

## 2023-08-19 DIAGNOSIS — E7800 Pure hypercholesterolemia, unspecified: Secondary | ICD-10-CM

## 2023-08-19 DIAGNOSIS — E1169 Type 2 diabetes mellitus with other specified complication: Secondary | ICD-10-CM

## 2023-08-20 LAB — COMPLETE METABOLIC PANEL WITHOUT GFR
AG Ratio: 1.8 (calc) (ref 1.0–2.5)
ALT: 37 U/L — ABNORMAL HIGH (ref 6–29)
AST: 30 U/L (ref 10–35)
Albumin: 4.2 g/dL (ref 3.6–5.1)
Alkaline phosphatase (APISO): 67 U/L (ref 37–153)
BUN: 16 mg/dL (ref 7–25)
CO2: 24 mmol/L (ref 20–32)
Calcium: 9.1 mg/dL (ref 8.6–10.4)
Chloride: 109 mmol/L (ref 98–110)
Creat: 0.66 mg/dL (ref 0.60–1.00)
Globulin: 2.3 g/dL (ref 1.9–3.7)
Glucose, Bld: 126 mg/dL — ABNORMAL HIGH (ref 65–99)
Potassium: 4.1 mmol/L (ref 3.5–5.3)
Sodium: 140 mmol/L (ref 135–146)
Total Bilirubin: 0.3 mg/dL (ref 0.2–1.2)
Total Protein: 6.5 g/dL (ref 6.1–8.1)

## 2023-08-20 LAB — HEMOGLOBIN A1C
Hgb A1c MFr Bld: 6.4 % — ABNORMAL HIGH (ref <5.7)
Mean Plasma Glucose: 137 mg/dL
eAG (mmol/L): 7.6 mmol/L

## 2023-08-20 LAB — LIPID PANEL
Cholesterol: 139 mg/dL (ref <200)
HDL: 75 mg/dL (ref >=50)
LDL Cholesterol (Calc): 44 mg/dL
Non-HDL Cholesterol (Calc): 64 mg/dL (ref <130)
Total CHOL/HDL Ratio: 1.9 (calc) (ref <5.0)
Triglycerides: 116 mg/dL (ref <150)

## 2023-08-20 LAB — CBC WITH DIFFERENTIAL/PLATELET
Absolute Lymphocytes: 3318 {cells}/uL (ref 850–3900)
Absolute Monocytes: 730 {cells}/uL (ref 200–950)
Basophils Absolute: 44 {cells}/uL (ref 0–200)
Basophils Relative: 0.5 %
Eosinophils Absolute: 229 {cells}/uL (ref 15–500)
Eosinophils Relative: 2.6 %
HCT: 45.9 % — ABNORMAL HIGH (ref 35.0–45.0)
Hemoglobin: 14.8 g/dL (ref 11.7–15.5)
MCH: 31.5 pg (ref 27.0–33.0)
MCHC: 32.2 g/dL (ref 32.0–36.0)
MCV: 97.7 fL (ref 80.0–100.0)
MPV: 11.6 fL (ref 7.5–12.5)
Monocytes Relative: 8.3 %
Neutro Abs: 4479 {cells}/uL (ref 1500–7800)
Neutrophils Relative %: 50.9 %
Platelets: 179 Thousand/uL (ref 140–400)
RBC: 4.7 Million/uL (ref 3.80–5.10)
RDW: 13.5 % (ref 11.0–15.0)
Total Lymphocyte: 37.7 %
WBC: 8.8 Thousand/uL (ref 3.8–10.8)

## 2023-08-20 LAB — MICROALBUMIN / CREATININE URINE RATIO
Creatinine, Urine: 195 mg/dL (ref 20–275)
Microalb Creat Ratio: 31 mg/g{creat} — ABNORMAL HIGH (ref <30)
Microalb, Ur: 6.1 mg/dL

## 2023-08-20 LAB — TSH: TSH: 1.95 m[IU]/L (ref 0.40–4.50)

## 2023-08-31 ENCOUNTER — Encounter: Payer: Self-pay | Admitting: Family Medicine

## 2023-08-31 ENCOUNTER — Ambulatory Visit (INDEPENDENT_AMBULATORY_CARE_PROVIDER_SITE_OTHER): Payer: Self-pay | Admitting: Family Medicine

## 2023-08-31 VITALS — BP 122/80 | HR 89 | Ht 66.5 in | Wt 150.4 lb

## 2023-08-31 DIAGNOSIS — Z0000 Encounter for general adult medical examination without abnormal findings: Secondary | ICD-10-CM | POA: Diagnosis not present

## 2023-08-31 DIAGNOSIS — I739 Peripheral vascular disease, unspecified: Secondary | ICD-10-CM | POA: Diagnosis not present

## 2023-08-31 DIAGNOSIS — E7800 Pure hypercholesterolemia, unspecified: Secondary | ICD-10-CM | POA: Diagnosis not present

## 2023-08-31 DIAGNOSIS — E1169 Type 2 diabetes mellitus with other specified complication: Secondary | ICD-10-CM | POA: Diagnosis not present

## 2023-08-31 DIAGNOSIS — Z23 Encounter for immunization: Secondary | ICD-10-CM

## 2023-08-31 DIAGNOSIS — I10 Essential (primary) hypertension: Secondary | ICD-10-CM

## 2023-08-31 DIAGNOSIS — Z7984 Long term (current) use of oral hypoglycemic drugs: Secondary | ICD-10-CM | POA: Diagnosis not present

## 2023-08-31 NOTE — Progress Notes (Signed)
 Subjective:    Patient ID: Angela Winters, female    DOB: [birth year], 73 y.o.   MRN: [id number]  Angela Winters is a 73 y.o. female presenting on 08/31/2023 for Annual Exam   HPI   Discussed the use of AI scribe software for clinical note transcription with the patient, who gave verbal consent to proceed.  History of Present Illness   Angela Winters is a 73 year old female who presents for an annual physical exam.      Coronary Artery Disease PAD Followed by Vascular for carotid surveillance and Cardiology w/ history heart disease On Aspirin 81mg  daily and current medications BP has been normalized, without medication now. No longer having high blood pressure. Fam history of carotid artery stenosis and PAD Vein & Vascular specialist October 2025 She will schedule w/ Cardiologist for yearly  HYPERLIPIDEMIA: - Reports no concerns. Last lipid panel LDL 44, controlled  - Currently taking Zetia  and Rosuvastatin  40mg  daily, tolerating well without side effects or myalgias   CHRONIC DM, Type 2: Reports no concerns A1c 6.4, maintained from previous 6.4 Meds: Metformin  XR 500mg  daily Reports good compliance. Tolerating well w/o side-effects Not on ACEi ARB Upcoming Eye Exam in September 2025 Denies hypoglycemia, polyuria, visual changes, numbness or tingling.   Abnormal Urinary Odor Chronic issue now with recurrent urinary odor, not actual infection UTI. Last treated 02/2023 with Bactrim  and resolution, she was given D-Mannose supplement but did not start it yet since problem resolved. Now symptoms returned with urinary odor.   Smoking / Tobacco Abuse She reports no issues with medication supply at this time. The patient is a smoker and acknowledges the associated health risks. She has not expressed a current desire to quit.    Health Maintenance:  Last Pneumonia vaccine Prevnar-13 2019. Due Prevnar 20  Shingrix vaccine completed 2023.  LDCT Screening 07/22/23  Mammogram 06/22/23  negative  Colonoscopy 07/10/14 previously done with [facility] some polyps found, has not planned to repeat yet.     08/31/2023    9:03 AM 02/16/2023   10:33 AM 11/04/2022   10:06 AM  Depression screen PHQ 2/9  Decreased Interest 0 0 0  Down, Depressed, Hopeless 0 0 0  PHQ - 2 Score 0 0 0  Altered sleeping   0  Tired, decreased energy   0  Change in appetite   0  Feeling bad or failure about yourself    0  Trouble concentrating   0  Moving slowly or fidgety/restless   0  Suicidal thoughts   0  PHQ-9 Score   0  Difficult doing work/chores   Not difficult at all       08/31/2023    9:03 AM 02/16/2023   10:33 AM 11/04/2022   10:06 AM  GAD 7 : Generalized Anxiety Score  Nervous, Anxious, on Edge 0 0 0  Control/stop worrying 0 0 0  Worry too much - different things 0 2 0  Trouble relaxing 0 0 0  Restless 0 0 0  Easily annoyed or irritable 0 0 0  Afraid - awful might happen 0 0 0  Total GAD 7 Score 0 2 0  Anxiety Difficulty  Not difficult at all Not difficult at all     Past Medical History:  Diagnosis Date   Chronic radicular low back pain    Hyperlipidemia    Hypertension    Sciatica of left side    Spinal stenosis at L4-L5 level    Past Surgical History:  Procedure Laterality Date   CATARACT EXTRACTION W/PHACO Right 02/19/2021   Procedure: CATARACT EXTRACTION PHACO AND INTRAOCULAR LENS PLACEMENT (IOC) RIGHT;  Surgeon: Mittie Gaskin, MD;  Location: [facility];  Service: Ophthalmology;  Laterality: Right;  9.68 01:21.2   CATARACT EXTRACTION W/PHACO Left 03/05/2021   Procedure: CATARACT EXTRACTION PHACO AND INTRAOCULAR LENS PLACEMENT (IOC) LEFT 6.90 01:10.4;  Surgeon: Mittie Gaskin, MD;  Location: [facility];  Service: Ophthalmology;  Laterality: Left;   REPLACEMENT TOTAL KNEE Left 07/24/2013   Dr. Mardee, [facility]   Social History   Socioeconomic History   Marital status: Married    Spouse name: Not on file   Number of children: 1   Years of  education: Not on file   Highest education level: Some college, no degree  Occupational History   Occupation: retired  Tobacco Use   Smoking status: Every Day    Current packs/day: 0.75    Average packs/day: 0.8 packs/day for 50.0 years (37.5 ttl pk-yrs)    Types: Cigarettes   Smokeless tobacco: Never   Tobacco comments:    Started around age 70  Vaping Use   Vaping status: Never Used  Substance and Sexual Activity   Alcohol use: Yes    Alcohol/week: 2.0 - 3.0 standard drinks of alcohol    Types: 2 - 3 Shots of liquor per week    Comment: occasional   Drug use: No   Sexual activity: Not on file  Other Topics Concern   Not on file  Social History Narrative   Not on file   Social Drivers of Health   Financial Resource Strain: Low Risk  (08/27/2023)   Overall Financial Resource Strain (CARDIA)    Difficulty of Paying Living Expenses: Not very hard  Food Insecurity: No Food Insecurity (08/27/2023)   Hunger Vital Sign    Worried About Running Out of Food in the Last Year: Never true    Ran Out of Food in the Last Year: Never true  Transportation Needs: No Transportation Needs (08/27/2023)   PRAPARE - Administrator, Civil Service (Medical): No    Lack of Transportation (Non-Medical): No  Physical Activity: Inactive (08/27/2023)   Exercise Vital Sign    Days of Exercise per Week: 0 days    Minutes of Exercise per Session: Not on file  Stress: No Stress Concern Present (08/27/2023)   [employer] of Occupational Health - Occupational Stress Questionnaire    Feeling of Stress: Not at all  Social Connections: Moderately Integrated (08/27/2023)   Social Connection and Isolation Panel    Frequency of Communication with Friends and Family: More than three times a week    Frequency of Social Gatherings with Friends and Family: Once a week    Attends Religious Services: More than 4 times per year    Active Member of [employer] or Organizations: No    Attends Hospital doctor: Not on file    Marital Status: Married  Catering manager Violence: Not At Risk (11/03/2022)   Humiliation, Afraid, Rape, and Kick questionnaire    Fear of Current or Ex-Partner: No    Emotionally Abused: No    Physically Abused: No    Sexually Abused: No   Family History  Problem Relation Age of Onset   Cancer Mother        Colon Cancer   Heart disease Father    Breast cancer Neg Hx    Current Outpatient Medications on File Prior to Visit  Medication Sig   aspirin 81 MG tablet Take 81 mg by mouth daily.    cholecalciferol (VITAMIN D) 1000 UNITS tablet VITAMIN D, 1000UNIT (Oral Tablet)  1 Every Day for 0 days  Quantity: 0.00;  Refills: 0   Ordered :09-Jan-2010  [name] ;  Started 11-Sep-2008 Active   citalopram  (CELEXA ) 40 MG tablet Take 1 tablet (40 mg total) by mouth daily.   D-Mannose 500 MG CAPS Take 500 mg by mouth 2 (two) times daily.   ezetimibe  (ZETIA ) 10 MG tablet TAKE 1 TABLET BY MOUTH EVERY DAY   meloxicam  (MOBIC ) 15 MG tablet TAKE 1 TABLET (15 MG TOTAL) BY MOUTH DAILY. AS NEEDED   metFORMIN  (GLUCOPHAGE -XR) 500 MG 24 hr tablet TAKE 1 TABLET BY MOUTH EVERY DAY WITH BREAKFAST   MULTIPLE VITAMIN PO 1 tablet daily.   rosuvastatin  (CRESTOR ) 40 MG tablet TAKE 1 TABLET BY MOUTH EVERY DAY   No current facility-administered medications on file prior to visit.    Review of Systems  Constitutional:  Negative for activity change, appetite change, chills, diaphoresis, fatigue and fever.  HENT:  Negative for congestion and hearing loss.   Eyes:  Negative for visual disturbance.  Respiratory:  Negative for cough, chest tightness, shortness of breath and wheezing.   Cardiovascular:  Negative for chest pain, palpitations and leg swelling.  Gastrointestinal:  Negative for abdominal pain, constipation, diarrhea, nausea and vomiting.  Genitourinary:  Negative for dysuria, frequency and hematuria.  Musculoskeletal:  Negative for arthralgias and neck pain.  Skin:   Negative for rash.  Neurological:  Negative for dizziness, weakness, light-headedness, numbness and headaches.  Hematological:  Negative for adenopathy.  Psychiatric/Behavioral:  Negative for behavioral problems, dysphoric mood and sleep disturbance.    Per HPI unless specifically indicated above     Objective:    BP 122/80 (BP Location: Right Arm, Patient Position: Sitting, Cuff Size: Normal)   Pulse 89   Ht 5' 6.5 (1.689 m)   Wt 150 lb 6 oz (68.2 kg)   SpO2 97%   BMI 23.91 kg/m   Wt Readings from Last 3 Encounters:  08/31/23 150 lb 6 oz (68.2 kg)  03/04/23 152 lb (68.9 kg)  02/16/23 150 lb (68 kg)    Physical Exam Vitals and nursing note reviewed.  Constitutional:      General: She is not in acute distress.    Appearance: She is well-developed. She is not diaphoretic.     Comments: Well-appearing, comfortable, cooperative  HENT:     Head: Normocephalic and atraumatic.  Eyes:     General:        Right eye: No discharge.        Left eye: No discharge.     Conjunctiva/sclera: Conjunctivae normal.     Pupils: Pupils are equal, round, and reactive to light.  Neck:     Thyroid: No thyromegaly.     Vascular: Carotid bruit (bilateral) present.  Cardiovascular:     Rate and Rhythm: Normal rate and regular rhythm.     Pulses: Normal pulses.     Heart sounds: Normal heart sounds. No murmur heard. Pulmonary:     Effort: Pulmonary effort is normal. No respiratory distress.     Breath sounds: Normal breath sounds. No wheezing or rales.  Abdominal:     General: Bowel sounds are normal. There is no distension.     Palpations: Abdomen is soft. There is no mass.     Tenderness: There is no abdominal tenderness.  Musculoskeletal:  General: No tenderness. Normal range of motion.     Cervical back: Normal range of motion and neck supple.     Right lower leg: No edema.     Left lower leg: No edema.     Comments: Upper / Lower Extremities: - Normal muscle tone, strength  bilateral upper extremities 5/5, lower extremities 5/5  Lymphadenopathy:     Cervical: No cervical adenopathy.  Skin:    General: Skin is warm and dry.     Findings: No erythema or rash.  Neurological:     Mental Status: She is alert and oriented to person, place, and time.     Comments: Distal sensation intact to light touch all extremities  Psychiatric:        Mood and Affect: Mood normal.        Behavior: Behavior normal.        Thought Content: Thought content normal.     Comments: Well groomed, good eye contact, normal speech and thoughts      I have personally reviewed the radiology report from 07/22/23 on LDCT.  CLINICAL DATA:  73 year old female current smoker, with 41 pack-year history of smoking, for follow-up lung cancer screening   EXAM: CT CHEST WITHOUT CONTRAST LOW-DOSE FOR LUNG CANCER SCREENING   TECHNIQUE: Multidetector CT imaging of the chest was performed following the standard protocol without IV contrast.   RADIATION DOSE REDUCTION: This exam was performed according to the departmental dose-optimization program which includes automated exposure control, adjustment of the mA and/or kV according to patient size and/or use of iterative reconstruction technique.   COMPARISON:  07/16/2022   FINDINGS: Cardiovascular: The heart is normal in size. No pericardial effusion.   No evidence of thoracic aortic aneurysm. Atherosclerotic calcifications of the aortic arch.   Moderate three-vessel coronary atherosclerosis.   Mediastinum/Nodes: No suspicious mediastinal lymphadenopathy.   Visualized thyroid is unremarkable.   Lungs/Pleura: Prior multifocal pneumonia has resolved. No focal consolidation.   Moderate centrilobular emphysematous changes, upper lung predominant.   Biapical pleural-parenchymal scarring. Stable 4.9 mm nodule in the posterior right upper lobe, likely related to the pleural parenchymal scarring. Otherwise, no new/suspicious  pulmonary nodules.   No pleural effusion or pneumothorax.   Upper Abdomen: Stable 2.8 cm left adrenal adenoma, benign. No follow-up is recommended. Vascular calcifications.   Musculoskeletal: Mild degenerative changes of the visualized thoracolumbar spine.   IMPRESSION: Lung-RADS 2, benign appearance or behavior. Continue annual screening with low-dose chest CT without contrast in 12 months.   Aortic Atherosclerosis (ICD10-I70.0) and Emphysema (ICD10-J43.9).     Electronically Signed   By: Pinkie Pebbles M.D.   On: 08/02/2023 01:03  Results for orders placed or performed in visit on 08/19/23  TSH   Collection Time: 08/19/23  7:59 AM  Result Value Ref Range   TSH 1.95 0.40 - 4.50 mIU/L  Microalbumin / creatinine urine ratio   Collection Time: 08/19/23  7:59 AM  Result Value Ref Range   Creatinine, Urine 195 20 - 275 mg/dL   Microalb, Ur 6.1 mg/dL   Microalb Creat Ratio 31 (H) <30 mg/g creat  CBC with Differential/Platelet   Collection Time: 08/19/23  7:59 AM  Result Value Ref Range   WBC 8.8 3.8 - 10.8 Thousand/uL   RBC 4.70 3.80 - 5.10 Million/uL   Hemoglobin 14.8 11.7 - 15.5 g/dL   HCT 54.0 (H) 64.9 - 54.9 %   MCV 97.7 80.0 - 100.0 fL   MCH 31.5 27.0 - 33.0 pg   MCHC  32.2 32.0 - 36.0 g/dL   RDW 86.4 88.9 - 84.9 %   Platelets 179 140 - 400 Thousand/uL   MPV 11.6 7.5 - 12.5 fL   Neutro Abs 4,479 1,500 - 7,800 cells/uL   Absolute Lymphocytes 3,318 850 - 3,900 cells/uL   Absolute Monocytes 730 200 - 950 cells/uL   Eosinophils Absolute 229 15 - 500 cells/uL   Basophils Absolute 44 0 - 200 cells/uL   Neutrophils Relative % 50.9 %   Total Lymphocyte 37.7 %   Monocytes Relative 8.3 %   Eosinophils Relative 2.6 %   Basophils Relative 0.5 %  COMPLETE METABOLIC PANEL WITH GFR   Collection Time: 08/19/23  7:59 AM  Result Value Ref Range   Glucose, Bld 126 (H) 65 - 99 mg/dL   BUN 16 7 - 25 mg/dL   Creat 9.33 9.39 - 8.99 mg/dL   BUN/Creatinine Ratio SEE NOTE: 6 -  22 (calc)   Sodium 140 135 - 146 mmol/L   Potassium 4.1 3.5 - 5.3 mmol/L   Chloride 109 98 - 110 mmol/L   CO2 24 20 - 32 mmol/L   Calcium  9.1 8.6 - 10.4 mg/dL   Total Protein 6.5 6.1 - 8.1 g/dL   Albumin 4.2 3.6 - 5.1 g/dL   Globulin 2.3 1.9 - 3.7 g/dL (calc)   AG Ratio 1.8 1.0 - 2.5 (calc)   Total Bilirubin 0.3 0.2 - 1.2 mg/dL   Alkaline phosphatase (APISO) 67 37 - 153 U/L   AST 30 10 - 35 U/L   ALT 37 (H) 6 - 29 U/L  Hemoglobin A1c   Collection Time: 08/19/23  7:59 AM  Result Value Ref Range   Hgb A1c MFr Bld 6.4 (H) <5.7 %   Mean Plasma Glucose 137 mg/dL   eAG (mmol/L) 7.6 mmol/L  Lipid panel   Collection Time: 08/19/23  7:59 AM  Result Value Ref Range   Cholesterol 139 <200 mg/dL   HDL 75 > OR = 50 mg/dL   Triglycerides 883 <849 mg/dL   LDL Cholesterol (Calc) 44 mg/dL (calc)   Total CHOL/HDL Ratio 1.9 <5.0 (calc)   Non-HDL Cholesterol (Calc) 64 <869 mg/dL (calc)      Assessment & Plan:   Problem List Items Addressed This Visit     Hypercholesterolemia without hypertriglyceridemia   PAD (peripheral artery disease) (HCC)   Primary hypertension   Type 2 diabetes mellitus with other specified complication (HCC)   Other Visit Diagnoses       Annual physical exam    -  Primary     Need for Streptococcus pneumoniae vaccination       Relevant Orders   Pneumococcal conjugate vaccine 20-valent (Completed)     Long term current use of oral hypoglycemic drug            Updated Health Maintenance information Reviewed recent lab results with patient Encouraged improvement to lifestyle with diet and exercise Goal of weight loss  Adult Wellness Visit Annual wellness visit completed. Blood pressure, weight, and cholesterol well-controlled. A1c at 6.4 indicates controlled diabetes. - Administer Prevnar 20 vaccine today. - Schedule flu shot for the fall. - Continue current medication regimen for cholesterol and diabetes. - Schedule follow-up appointment in six  months.  Type 2 diabetes mellitus, controlled Diabetes well-controlled with A1c of 6.4. Metformin  effective. - Continue metformin  once daily.  Hyperlipidemia, on therapy Hyperlipidemia well-managed. LDL at 44, below target. Zetia  and rosuvastatin  effective. - Continue Zetia  and rosuvastatin . - Monitor cholesterol levels  regularly.  Atherosclerosis with vascular follow-up Carotid Stenosis / PAD Atherosclerosis with vascular involvement. Pulsatile tinnitus persists. - Continue follow-up with vascular specialists. - Review vascular specialist reports.  Bladder symptoms with recurrent urinary odor, no infection Recurrent urinary odor without infection. Previous antibiotics effective but symptoms recur. Possible bladder irritants identified. - Start D-mannose supplement twice daily. - Limit intake of bladder irritants such as tea and carbonated drinks. - Monitor symptoms and contact if not resolved or if new infection occurs.  General Health Maintenance Discussed health maintenance, vaccinations, and screenings. Pneumonia vaccine due. Colonoscopy overdue; considering options. - Administer Prevnar 20 vaccine today. - Schedule flu shot for the fall. - Attend eye exam in September. - Consider options for colon cancer screening, including Cologuard or declining further screening.  Follow-up Follow-up scheduled in six months to monitor health and manage conditions. - Schedule follow-up appointment in six months. - Monitor medication refills and contact if any issues arise.        Orders Placed This Encounter  Procedures   Pneumococcal conjugate vaccine 20-valent    No orders of the defined types were placed in this encounter.    Follow up plan: Return for 6 month DM A1c, Urinary symptoms.  Marsa Officer, DO [facility] 08/31/2023, 9:19 AM

## 2023-08-31 NOTE — Patient Instructions (Addendum)
 Thank you for coming to the office today.  Labs overall look great!  Recent Labs    11/04/22 1032 08/19/23 0759  HGBA1C 6.4* 6.4*    Prevnar-20 vaccine today  Flu shot in the Fall  Caution with bladder irritants Limit carbonation, and teas Okay to stay well hydrated with water and try the D-Mannose twice a day supplement to see if it helps, let me know if not resolved or if new infection we can treat it. Or talk to Urologist  Consider Cologuard vs Colonoscopy or skip.  Please schedule a Follow-up Appointment to: Return for 6 month DM A1c, Urinary symptoms.  If you have any other questions or concerns, please feel free to call the office or send a message through MyChart. You may also schedule an earlier appointment if necessary.  Additionally, you may be receiving a survey about your experience at our office within a few days to 1 week by e-mail or mail. We value your feedback.  Marsa Officer, DO [facility], [place]

## 2023-09-30 DIAGNOSIS — H04123 Dry eye syndrome of bilateral lacrimal glands: Secondary | ICD-10-CM | POA: Diagnosis not present

## 2023-09-30 DIAGNOSIS — Z961 Presence of intraocular lens: Secondary | ICD-10-CM | POA: Diagnosis not present

## 2023-09-30 DIAGNOSIS — E119 Type 2 diabetes mellitus without complications: Secondary | ICD-10-CM | POA: Diagnosis not present

## 2023-09-30 DIAGNOSIS — H43393 Other vitreous opacities, bilateral: Secondary | ICD-10-CM | POA: Diagnosis not present

## 2023-10-08 IMAGING — CT CT CHEST LUNG CANCER SCREENING LOW DOSE W/O CM
2 of 5 series · 15 of 40 positions shown, 18 images · non-contrast
Comparison: CT lung cancer screening CT dated September 13, 2019

CLINICAL DATA: Current smoker with 40 pack-year history



[Series 3: lung 1.00 · axial · 0.56mm/px · z∈[-1208,-927]mm · 12 of 311 slices shown, 15 images]
[im 15/311  mediastinal]
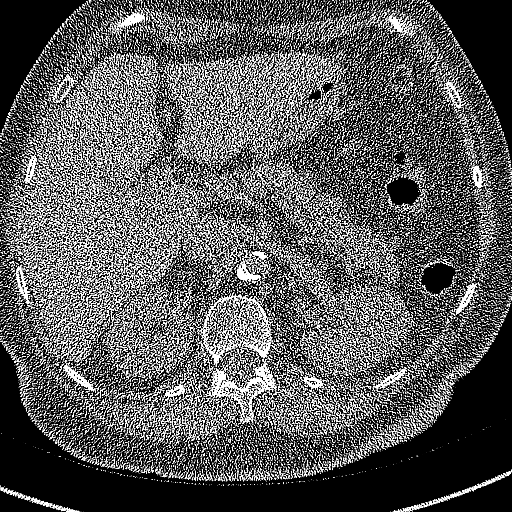
[im 15/311  lung]
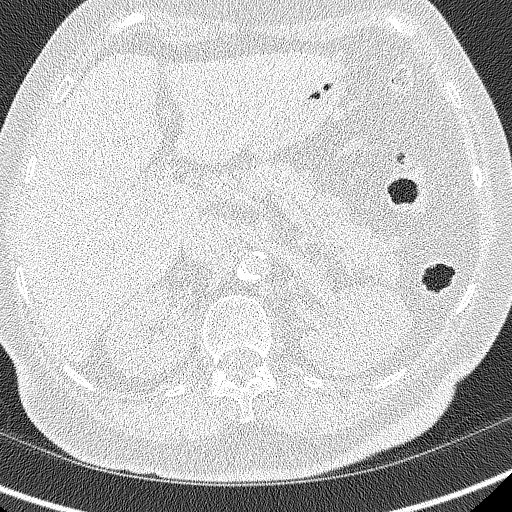
[im 43/311  lung]
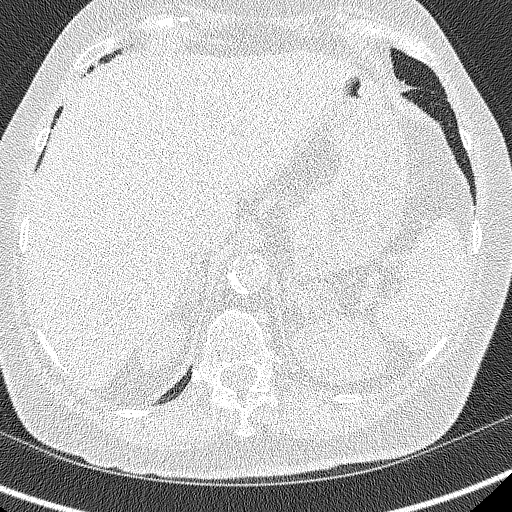
[im 71/311  lung]
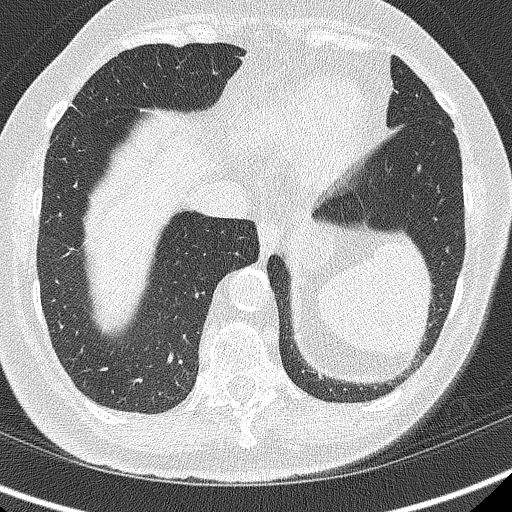
[im 99/311  lung]
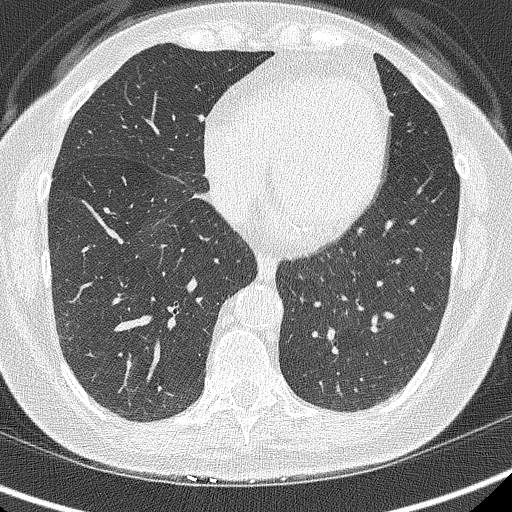
[im 113/311  mediastinal]
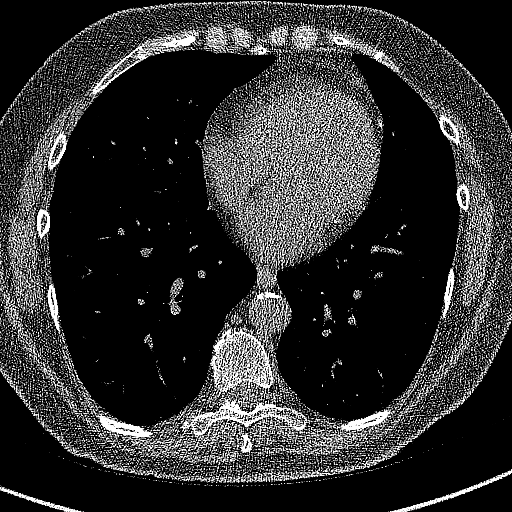
[im 113/311  lung]
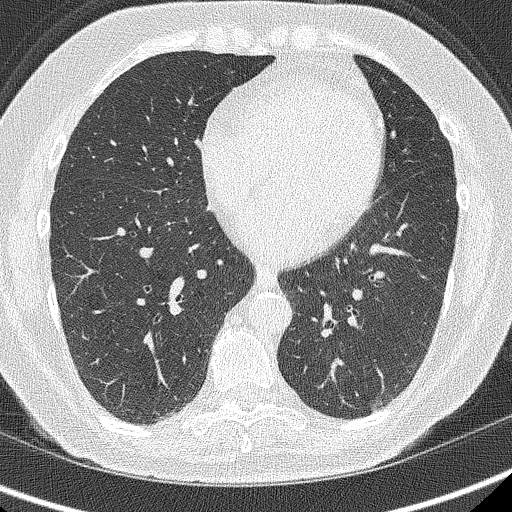
[im 141/311  lung]
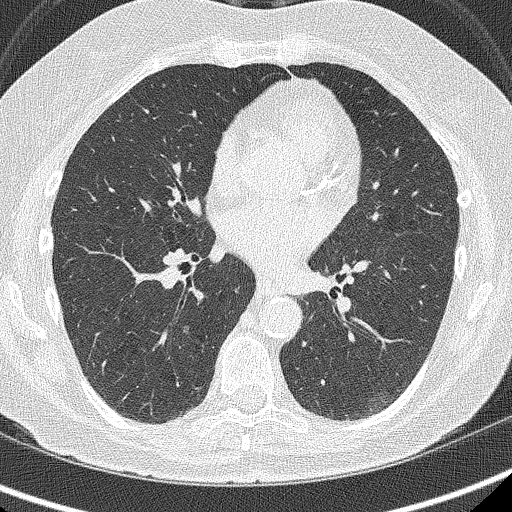
[im 170/311  lung]
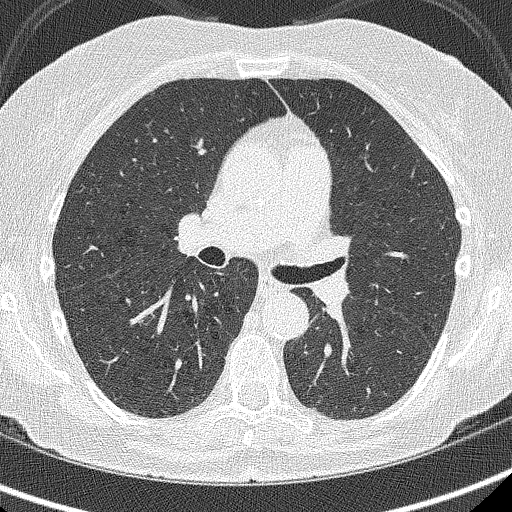
[im 198/311  lung]
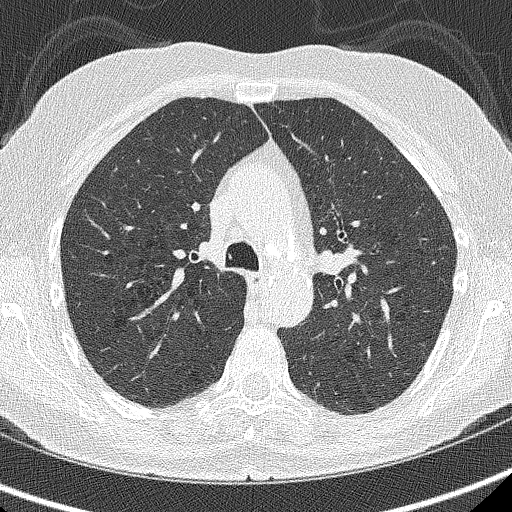
[im 212/311  mediastinal]
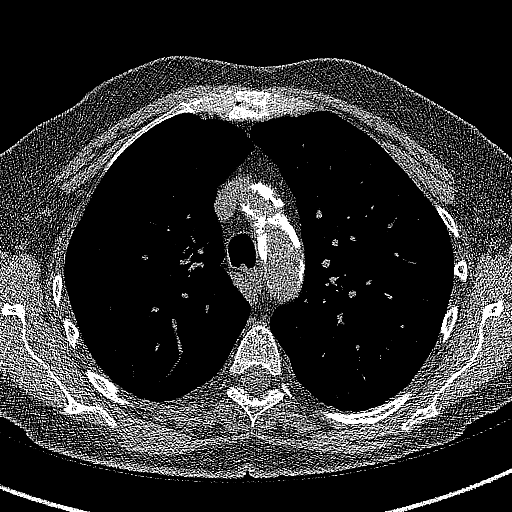
[im 212/311  lung]
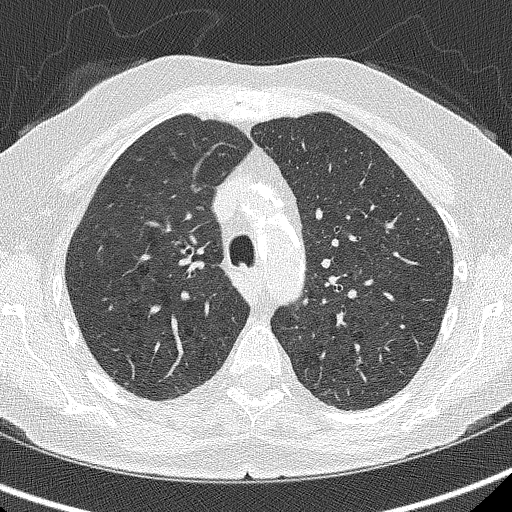
[im 240/311  lung]
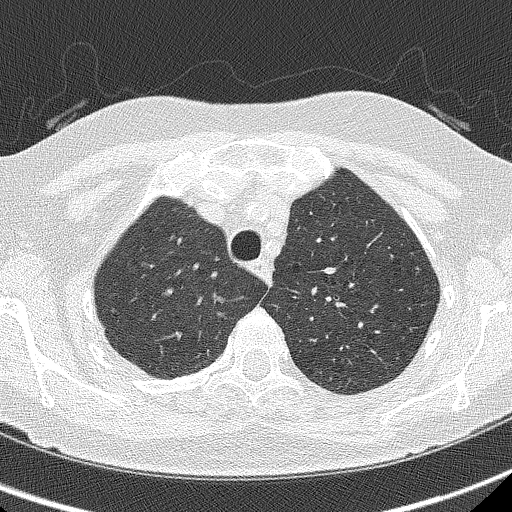
[im 268/311  lung]
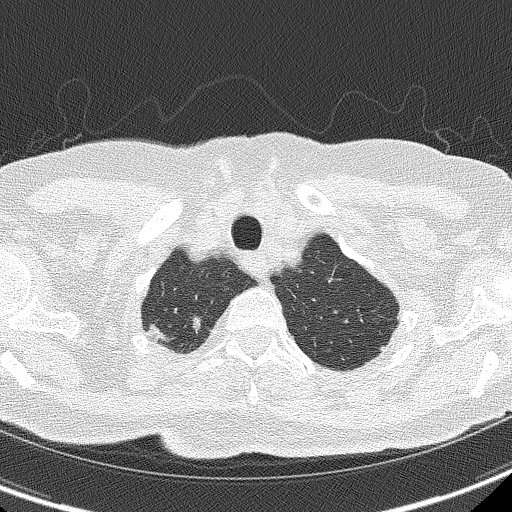
[im 296/311  lung]
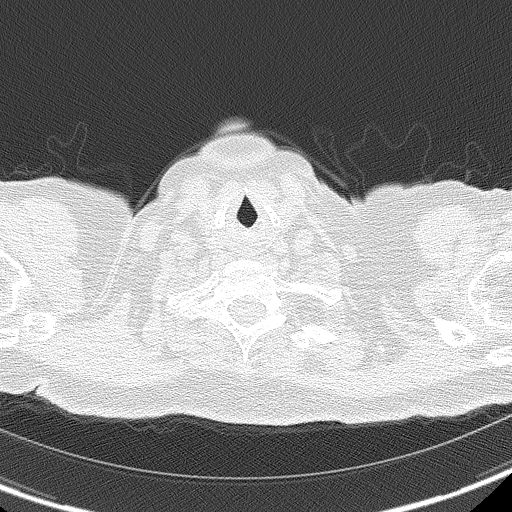

[Series 5: coronals lung 1.00 cor · coronal · 0.56mm/px · 3 of 270 slices shown]
[im 54/270  lung]
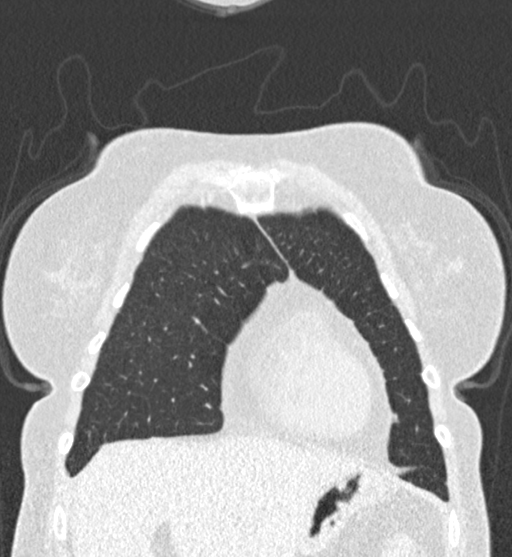
[im 108/270  lung]
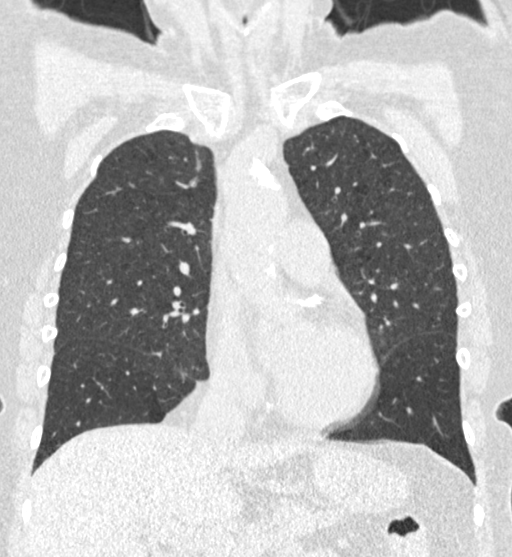
[im 162/270  lung]
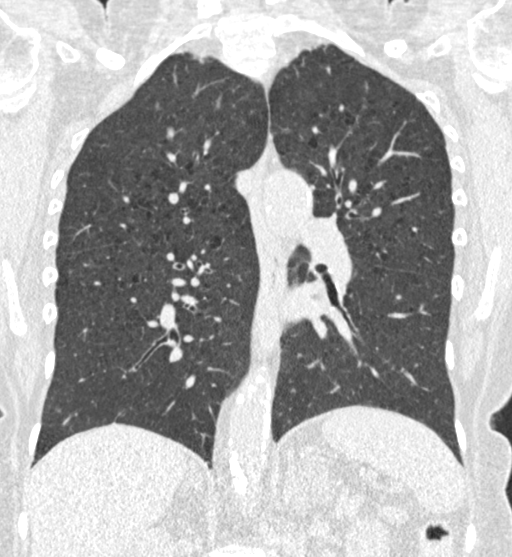

[15 of 40 positions shown; findings below may reference images not displayed]

FINDINGS: Cardiovascular: Normal heart size. No pericardial effusion. Left
main and three-vessel coronary artery calcifications.
Atherosclerotic disease of the thoracic aorta.

Mediastinum/Nodes: Esophagus and thyroid are unremarkable. No
pathologically enlarged lymph nodes seen in the chest.

Lungs/Pleura: Central airways are patent. Centrilobular emphysema.
No consolidation, pleural effusion or pneumothorax. Multiple small
solid pulmonary nodules are seen in the lung largest is located in
the right upper lobe on image 44 and measures 7 mm in mean diameter.
Numerous tiny pulmonary nodules which were new on prior exam have
resolved.

Upper Abdomen: Low-attenuation left thyroid nodule measuring up to
2.6 cm, unchanged when compared with prior exam and likely a benign
adenoma. No acute abnormality.

Musculoskeletal: No chest wall mass or suspicious bone lesions
identified.
IMPRESSION: 1. Lung-RADS 2, benign appearance or behavior. Continue annual
screening with low-dose chest CT without contrast in 12 months.
2. Left main and three-vessel coronary artery calcifications.
3. Aortic Atherosclerosis (K1W5R-448.8) and Emphysema (K1W5R-M42.Y).

## 2023-10-14 ENCOUNTER — Other Ambulatory Visit: Payer: Self-pay | Admitting: Family Medicine

## 2023-10-14 DIAGNOSIS — E119 Type 2 diabetes mellitus without complications: Secondary | ICD-10-CM

## 2023-10-15 NOTE — Telephone Encounter (Signed)
 Requested Prescriptions  Pending Prescriptions Disp Refills   metFORMIN  (GLUCOPHAGE -XR) 500 MG 24 hr tablet [Pharmacy Med Name: METFORMIN  HCL ER 500 MG TABLET] 90 tablet 0    Sig: TAKE 1 TABLET BY MOUTH EVERY DAY WITH BREAKFAST     Endocrinology:  Diabetes - Biguanides Failed - 10/15/2023 12:25 PM      Failed - eGFR in normal range and within 360 days    EGFR (African American)  Date Value Ref Range Status  07/26/2013 >60  Final   GFR calc Af Amer  Date Value Ref Range Status  11/20/2019 89 >59 mL/min/1.73 Final    Comment:    **In accordance with recommendations from the NKF-ASN Task force,**   [facility] is in the process of updating its eGFR calculation to the   2021 CKD-EPI creatinine equation that estimates kidney function   without a race variable.    EGFR (Non-African Amer.)  Date Value Ref Range Status  07/26/2013 >60  Final    Comment:    eGFR values <57mL/min/1.73 m2 may be an indication of chronic kidney disease (CKD). Calculated eGFR is useful in patients with stable renal function. The eGFR calculation will not be reliable in acutely ill patients when serum creatinine is changing rapidly. It is not useful in  patients on dialysis. The eGFR calculation may not be applicable to patients at the low and high extremes of body sizes, pregnant women, and vegetarians. POTASSIUM - Slight hemolysis, interpret results with  - caution...tpl    GFR calc non Af Amer  Date Value Ref Range Status  11/20/2019 77 >59 mL/min/1.73 Final   eGFR  Date Value Ref Range Status  04/21/2022 76 >59 mL/min/1.73 Final         Failed - B12 Level in normal range and within 720 days    No results found for: VITAMINB12       Passed - Cr in normal range and within 360 days    Creat  Date Value Ref Range Status  08/19/2023 0.66 0.60 - 1.00 mg/dL Final   Creatinine, Urine  Date Value Ref Range Status  08/19/2023 195 20 - 275 mg/dL Final         Passed - HBA1C is between 0 and 7.9  and within 180 days    Hgb A1c MFr Bld  Date Value Ref Range Status  08/19/2023 6.4 (H) <5.7 % Final    Comment:    For someone without known diabetes, a hemoglobin  A1c value between 5.7% and 6.4% is consistent with prediabetes and should be confirmed with a  follow-up test. . For someone with known diabetes, a value <7% indicates that their diabetes is well controlled. A1c targets should be individualized based on duration of diabetes, age, comorbid conditions, and other considerations. . This assay result is consistent with an increased risk of diabetes. . Currently, no consensus exists regarding use of hemoglobin A1c for diagnosis of diabetes for children. Angela Winters - Valid encounter within last 6 months    Recent Outpatient Visits           1 month ago Annual physical exam   [place] [facility] [place], Marsa PARAS, DO   7 months ago Acute cystitis with hematuria   [place] [facility] [place] Marsa PARAS, DO       Future Appointments             In 1  week Darliss Rogue, MD [facility] - CBC within normal limits and completed in the last 12 months    WBC  Date Value Ref Range Status  08/19/2023 8.8 3.8 - 10.8 Thousand/uL Final   RBC  Date Value Ref Range Status  08/19/2023 4.70 3.80 - 5.10 Million/uL Final   Hemoglobin  Date Value Ref Range Status  08/19/2023 14.8 11.7 - 15.5 g/dL Final  91/98/7975 85.2 11.1 - 15.9 g/dL Final   HCT  Date Value Ref Range Status  08/19/2023 45.9 (H) 35.0 - 45.0 % Final   Hematocrit  Date Value Ref Range Status  08/27/2022 44.5 34.0 - 46.6 % Final   MCHC  Date Value Ref Range Status  08/19/2023 32.2 32.0 - 36.0 g/dL Final    Comment:    For adults, a slight decrease in the calculated MCHC value (in the range of 30 to 32 g/dL) is most likely not clinically significant; however, it should be interpreted with  caution in correlation with other red cell parameters and the patient's clinical condition.    [facility]  Date Value Ref Range Status  08/19/2023 31.5 27.0 - 33.0 pg Final   MCV  Date Value Ref Range Status  08/19/2023 97.7 80.0 - 100.0 fL Final  04/21/2022 95 79 - 97 fL Final  07/12/2013 98 80 - 100 fL Final   No results found for: PLTCOUNTKUC, LABPLAT, POCPLA RDW  Date Value Ref Range Status  08/19/2023 13.5 11.0 - 15.0 % Final  04/21/2022 12.9 11.7 - 15.4 % Final  07/12/2013 14.0 11.5 - 14.5 % Final

## 2023-10-19 ENCOUNTER — Other Ambulatory Visit: Payer: Self-pay | Admitting: Family Medicine

## 2023-10-19 NOTE — Telephone Encounter (Unsigned)
 Copied from CRM #[id number]. Topic: Clinical - Medication Refill >> Oct 19, 2023 12:39 PM Carla L wrote: Medication: ezetimibe  (ZETIA ) 10 MG tablet Patient requesting refill, took her last tablet today.   Has the patient contacted their pharmacy? Yes Told to reach out to office as they have not heard from office.   This is the patient's preferred pharmacy:  [id number] - [place], [place] - [address] [place] [address] Phone: [phone] Fax: [phone]  Is this the correct pharmacy for this prescription? Yes  Has the prescription been filled recently? No  Is the patient out of the medication? Yes  Has the patient been seen for an appointment in the last year OR does the patient have an upcoming appointment? Yes  Can we respond through MyChart? No  Agent: Please be advised that Rx refills may take up to 3 business days. We ask that you follow-up with your pharmacy.

## 2023-10-20 MED ORDER — EZETIMIBE 10 MG PO TABS: 10.0000 mg | ORAL_TABLET | Freq: Every day | ORAL | 1 refills | Status: AC

## 2023-10-20 NOTE — Telephone Encounter (Signed)
 Requested Prescriptions  Pending Prescriptions Disp Refills   ezetimibe  (ZETIA ) 10 MG tablet 90 tablet 1    Sig: Take 1 tablet (10 mg total) by mouth daily.     Cardiovascular:  Antilipid - Sterol Transport Inhibitors Failed - 10/20/2023  1:52 PM      Failed - ALT in normal range and within 360 days    ALT  Date Value Ref Range Status  08/19/2023 37 (H) 6 - 29 U/L Final         Failed - Lipid Panel in normal range within the last 12 months    Cholesterol, Total  Date Value Ref Range Status  04/21/2022 133 100 - 199 mg/dL Final   Cholesterol  Date Value Ref Range Status  08/19/2023 139 <200 mg/dL Final   LDL Cholesterol (Calc)  Date Value Ref Range Status  08/19/2023 44 mg/dL (calc) Final    Comment:    Reference range: <100 . Desirable range <100 mg/dL for primary prevention;   <70 mg/dL for patients with CHD or diabetic patients  with > or = 2 CHD risk factors. SABRA LDL-C is now calculated using the Martin-Hopkins  calculation, which is a validated novel method providing  better accuracy than the Friedewald equation in the  estimation of LDL-C.  Gladis APPLETHWAITE et al. SANDREA. 7986;689(80): 2061-2068  (http://education.Quest[employer].com/faq/FAQ164)    LDL Direct  Date Value Ref Range Status  04/21/2022 39 0 - 99 mg/dL Final   HDL  Date Value Ref Range Status  08/19/2023 75 > OR = 50 mg/dL Final  96/73/7975 62 >60 mg/dL Final   Triglycerides  Date Value Ref Range Status  08/19/2023 116 <150 mg/dL Final         Passed - AST in normal range and within 360 days    AST  Date Value Ref Range Status  08/19/2023 30 10 - 35 U/L Final         Passed - Patient is not pregnant      Passed - Valid encounter within last 12 months    Recent Outpatient Visits           1 month ago Annual physical exam   [place] [facility] [place], Marsa PARAS, DO   7 months ago Acute cystitis with hematuria   [place] [facility] [place] Marsa PARAS, DO       Future Appointments             In 1 week Agbor-Etang, Redell, MD [facility]

## 2023-10-27 ENCOUNTER — Encounter: Payer: Self-pay | Admitting: Cardiology

## 2023-10-27 ENCOUNTER — Ambulatory Visit: Attending: Cardiology | Admitting: Cardiology

## 2023-10-27 VITALS — BP 100/60 | HR 89 | Ht 66.0 in | Wt 145.2 lb

## 2023-10-27 DIAGNOSIS — I2510 Atherosclerotic heart disease of native coronary artery without angina pectoris: Secondary | ICD-10-CM

## 2023-10-27 DIAGNOSIS — E785 Hyperlipidemia, unspecified: Secondary | ICD-10-CM | POA: Diagnosis not present

## 2023-10-27 DIAGNOSIS — I340 Nonrheumatic mitral (valve) insufficiency: Secondary | ICD-10-CM

## 2023-10-27 DIAGNOSIS — F17200 Nicotine dependence, unspecified, uncomplicated: Secondary | ICD-10-CM | POA: Diagnosis not present

## 2023-10-27 DIAGNOSIS — I10 Essential (primary) hypertension: Secondary | ICD-10-CM | POA: Diagnosis not present

## 2023-10-27 NOTE — Progress Notes (Signed)
 Cardiology Office Note:    Date:  10/27/2023   ID:  Angela Winters, DOB 1950/08/25, MRN [id number]  PCP:  [place] Marsa PARAS, DO   [facility] Providers Cardiologist:  Redell Cave, MD     Referring MD: [place] Marsa *   Chief Complaint  Patient presents with   Follow-up    12 month follow up pt has been doing well with no complaints of chest pain, chest pressure or SOB, medciation reviewed verbally with patient    History of Present Illness:    Angela Winters is a 73 y.o. female with a hx of hypertension, hyperlipidemia, emphysema, current smoker x 50+ years who presents follow-up.  Doing okay, denies chest pain or shortness of breath.  BP adequately controlled without any BP medications.  Compliant with cholesterol medications, aspirin as prescribed.  Denies dizziness, syncope.  She still smokes.   Prior notes Echo 7/24 EF 60 to 65%, severe basal septal LVH 1.6 cm,, no LVOT obstruction Patient had a chest CT 03/2022 for lung cancer screening, aortic atherosclerosis and three-vessel coronary calcification noted, emphysema also noted.  Past Medical History:  Diagnosis Date   Chronic radicular low back pain    Hyperlipidemia    Hypertension    Sciatica of left side    Spinal stenosis at L4-L5 level     Past Surgical History:  Procedure Laterality Date   CATARACT EXTRACTION W/PHACO Right 02/19/2021   Procedure: CATARACT EXTRACTION PHACO AND INTRAOCULAR LENS PLACEMENT (IOC) RIGHT;  Surgeon: Mittie Gaskin, MD;  Location: [facility];  Service: Ophthalmology;  Laterality: Right;  9.68 01:21.2   CATARACT EXTRACTION W/PHACO Left 03/05/2021   Procedure: CATARACT EXTRACTION PHACO AND INTRAOCULAR LENS PLACEMENT (IOC) LEFT 6.90 01:10.4;  Surgeon: Mittie Gaskin, MD;  Location: [facility];  Service: Ophthalmology;  Laterality: Left;   REPLACEMENT TOTAL KNEE Left 07/24/2013   Dr. Hooten, [facility]    Current Medications: Current Meds   Medication Sig   aspirin 81 MG tablet Take 81 mg by mouth daily.    cholecalciferol (VITAMIN D) 1000 UNITS tablet VITAMIN D, 1000UNIT (Oral Tablet)  1 Every Day for 0 days  Quantity: 0.00;  Refills: 0   Ordered :09-Jan-2010  [name] ;  Started 11-Sep-2008 Active   citalopram  (CELEXA ) 40 MG tablet Take 1 tablet (40 mg total) by mouth daily.   ezetimibe  (ZETIA ) 10 MG tablet Take 1 tablet (10 mg total) by mouth daily.   meloxicam  (MOBIC ) 15 MG tablet TAKE 1 TABLET (15 MG TOTAL) BY MOUTH DAILY. AS NEEDED   metFORMIN  (GLUCOPHAGE -XR) 500 MG 24 hr tablet TAKE 1 TABLET BY MOUTH EVERY DAY WITH BREAKFAST   MULTIPLE VITAMIN PO 1 tablet daily.   rosuvastatin  (CRESTOR ) 40 MG tablet TAKE 1 TABLET BY MOUTH EVERY DAY     Allergies:   Codeine   Social History   Socioeconomic History   Marital status: Married    Spouse name: Not on file   Number of children: 1   Years of education: Not on file   Highest education level: Some college, no degree  Occupational History   Occupation: retired  Tobacco Use   Smoking status: Every Day    Current packs/day: 0.75    Average packs/day: 0.8 packs/day for 50.0 years (37.5 ttl pk-yrs)    Types: Cigarettes   Smokeless tobacco: Never   Tobacco comments:    Started around age 86  Vaping Use   Vaping status: Never Used  Substance and Sexual Activity  Alcohol use: Yes    Alcohol/week: 2.0 - 3.0 standard drinks of alcohol    Types: 2 - 3 Shots of liquor per week    Comment: occasional   Drug use: No   Sexual activity: Not on file  Other Topics Concern   Not on file  Social History Narrative   Not on file   Social Drivers of Health   Financial Resource Strain: Low Risk  (08/27/2023)   Overall Financial Resource Strain (CARDIA)    Difficulty of Paying Living Expenses: Not very hard  Food Insecurity: No Food Insecurity (08/27/2023)   Hunger Vital Sign    Worried About Running Out of Food in the Last Year: Never true    Ran Out of Food in the  Last Year: Never true  Transportation Needs: No Transportation Needs (08/27/2023)   PRAPARE - Administrator, Civil Service (Medical): No    Lack of Transportation (Non-Medical): No  Physical Activity: Inactive (08/27/2023)   Exercise Vital Sign    Days of Exercise per Week: 0 days    Minutes of Exercise per Session: Not on file  Stress: No Stress Concern Present (08/27/2023)   [employer] of Occupational Health - Occupational Stress Questionnaire    Feeling of Stress: Not at all  Social Connections: Moderately Integrated (08/27/2023)   Social Connection and Isolation Panel    Frequency of Communication with Friends and Family: More than three times a week    Frequency of Social Gatherings with Friends and Family: Once a week    Attends Religious Services: More than 4 times per year    Active Member of [employer] or Organizations: No    Attends Engineer, structural: Not on file    Marital Status: Married     Family History: The patient's family history includes Cancer in her mother; Heart disease in her father. There is no history of Breast cancer.  ROS:   Please see the history of present illness.     All other systems reviewed and are negative.  EKGs/Labs/Other Studies Reviewed:    The following studies were reviewed today:  Recent Labs: 08/19/2023: ALT 37; BUN 16; Creat 0.66; Hemoglobin 14.8; Platelets 179; Potassium 4.1; Sodium 140; TSH 1.95  Recent Lipid Panel    Component Value Date/Time   CHOL 139 08/19/2023 0759   CHOL 133 04/21/2022 1308   TRIG 116 08/19/2023 0759   HDL 75 08/19/2023 0759   HDL 62 04/21/2022 1308   CHOLHDL 1.9 08/19/2023 0759   LDLCALC 44 08/19/2023 0759   LDLDIRECT 39 04/21/2022 1308     Risk Assessment/Calculations:               Physical Exam:    VS:  BP 100/60 (BP Location: Left Arm, Patient Position: Sitting, Cuff Size: Normal)   Pulse 89   Ht 5' 6 (1.676 m)   Wt 145 lb 3.2 oz (65.9 kg)   SpO2 97%   BMI 23.44  kg/m     Wt Readings from Last 3 Encounters:  10/27/23 145 lb 3.2 oz (65.9 kg)  08/31/23 150 lb 6 oz (68.2 kg)  03/04/23 152 lb (68.9 kg)     GEN:  Well nourished, well developed in no acute distress HEENT: Normal NECK: No JVD; No carotid bruits CARDIAC: RRR, 2/6 systolic murmur RESPIRATORY: Diminished breath sounds at bases ABDOMEN: Soft, non-tender, non-distended MUSCULOSKELETAL:  No edema; No deformity  SKIN: Warm and dry NEUROLOGIC:  Alert and oriented x 3 PSYCHIATRIC:  Normal affect   ASSESSMENT:    1. Coronary artery disease involving native coronary artery of native heart, unspecified whether angina present   2. Primary hypertension   3. Hyperlipidemia LDL goal <70   4. Mitral valve insufficiency, unspecified etiology   5. Current smoker     PLAN:    In order of problems listed above:  CAD, three-vessel coronary calcification on chest CT.  LDL at goal.  Echo 7/24 EF 60 to 65%.  Denies chest pain.  Continue aspirin 81 mg, Zetia , Crestor  40 mg daily.   Hypertension.  BP controlled off medications.  Monitor off BP meds. Hyperlipidemia, cholesterol controlled, continue Crestor  40 mg daily, Zetia  10 mg daily. Mild MR, repeat echo in 1 year. Current smoker, smoking cessation advised.  Follow-up in 12 months     Medication Adjustments/Labs and Tests Ordered: Current medicines are reviewed at length with the patient today.  Concerns regarding medicines are outlined above.  Orders Placed This Encounter  Procedures   EKG 12-Lead   ECHOCARDIOGRAM COMPLETE   No orders of the defined types were placed in this encounter.   Patient Instructions  Medication Instructions:  Your physician recommends that you continue on your current medications as directed. Please refer to the Current Medication list given to you today.   *If you need a refill on your cardiac medications before your next appointment, please call your pharmacy*  Lab Work: No labs ordered today  If you  have labs (blood work) drawn today and your tests are completely normal, you will receive your results only by: MyChart Message (if you have MyChart) OR A paper copy in the mail If you have any lab test that is abnormal or we need to change your treatment, we will call you to review the results.  Testing/Procedures: Your physician has requested that you have an echocardiogram in 1 year. Echocardiography is a painless test that uses sound waves to create images of your heart. It provides your doctor with information about the size and shape of your heart and how well your heart's chambers and valves are working.   You may receive an ultrasound enhancing agent through an IV if needed to better visualize your heart during the echo. This procedure takes approximately one hour.  There are no restrictions for this procedure.  This will take place at [address] [facility] [address]) [address], [place] [address]  Please note: We ask at that you not bring children with you during ultrasound (echo/ vascular) testing. Due to room size and safety concerns, children are not allowed in the ultrasound rooms during exams. Our front office staff cannot provide observation of children in our lobby area while testing is being conducted. An adult accompanying a patient to their appointment will only be allowed in the ultrasound room at the discretion of the ultrasound technician under special circumstances. We apologize for any inconvenience.   Follow-Up: At [facility], you and your health needs are our priority.  As part of our continuing mission to provide you with exceptional heart care, our providers are all part of one team.  This team includes your primary Cardiologist (physician) and Advanced Practice Providers or APPs (Physician Assistants and Nurse Practitioners) who all work together to provide you with the care you need, when you need it.  Your next appointment:   1 year(s)  Provider:    You may see Redell Cave, MD or one of the following Advanced Practice Providers on your designated Care  Team:   Lonni Meager, NP Lesley Maffucci, PA-C Bernardino Bring, PA-C Cadence Franchester, PA-C Tylene Lunch, NP Barnie Hila, NP    We recommend signing up for the patient portal called MyChart.  Sign up information is provided on this After Visit Summary.  MyChart is used to connect with patients for Virtual Visits (Telemedicine).  Patients are able to view lab/test results, encounter notes, upcoming appointments, etc.  Non-urgent messages can be sent to your provider as well.   To learn more about what you can do with MyChart, go to [email or web address].            Signed, Redell Cave, MD  10/27/2023 11:10 AM    [facility]

## 2023-10-27 NOTE — Patient Instructions (Signed)
 Medication Instructions:  Your physician recommends that you continue on your current medications as directed. Please refer to the Current Medication list given to you today.   *If you need a refill on your cardiac medications before your next appointment, please call your pharmacy*  Lab Work: No labs ordered today  If you have labs (blood work) drawn today and your tests are completely normal, you will receive your results only by: MyChart Message (if you have MyChart) OR A paper copy in the mail If you have any lab test that is abnormal or we need to change your treatment, we will call you to review the results.  Testing/Procedures: Your physician has requested that you have an echocardiogram in 1 year. Echocardiography is a painless test that uses sound waves to create images of your heart. It provides your doctor with information about the size and shape of your heart and how well your heart's chambers and valves are working.   You may receive an ultrasound enhancing agent through an IV if needed to better visualize your heart during the echo. This procedure takes approximately one hour.  There are no restrictions for this procedure.  This will take place at [address] [facility] [address]) [address], [place] [address]  Please note: We ask at that you not bring children with you during ultrasound (echo/ vascular) testing. Due to room size and safety concerns, children are not allowed in the ultrasound rooms during exams. Our front office staff cannot provide observation of children in our lobby area while testing is being conducted. An adult accompanying a patient to their appointment will only be allowed in the ultrasound room at the discretion of the ultrasound technician under special circumstances. We apologize for any inconvenience.   Follow-Up: At [facility], you and your health needs are our priority.  As part of our continuing mission to provide you with exceptional  heart care, our providers are all part of one team.  This team includes your primary Cardiologist (physician) and Advanced Practice Providers or APPs (Physician Assistants and Nurse Practitioners) who all work together to provide you with the care you need, when you need it.  Your next appointment:   1 year(s)  Provider:   You may see Constancia Delton, MD or one of the following Advanced Practice Providers on your designated Care Team:   Laneta Pintos, NP Gildardo Labrador, PA-C Varney Gentleman, PA-C Cadence [place], PA-C Ronald Cockayne, NP Morey Ar, NP    We recommend signing up for the patient portal called MyChart.  Sign up information is provided on this After Visit Summary.  MyChart is used to connect with patients for Virtual Visits (Telemedicine).  Patients are able to view lab/test results, encounter notes, upcoming appointments, etc.  Non-urgent messages can be sent to your provider as well.   To learn more about what you can do with MyChart, go to [email or web address].

## 2023-11-01 NOTE — Progress Notes (Signed)
 Angela Winters                                          MRN: [id number]   11/01/2023   The [facility] Quality Team Specialist reviewed this patient medical record for the purposes of chart review for care gap closure. The following were reviewed: chart review for care gap closure-diabetic eye exam.    [facility]BCI Quality Team

## 2023-11-08 ENCOUNTER — Ambulatory Visit (INDEPENDENT_AMBULATORY_CARE_PROVIDER_SITE_OTHER): Payer: Medicare PPO | Admitting: Vascular Surgery

## 2023-11-08 ENCOUNTER — Encounter (INDEPENDENT_AMBULATORY_CARE_PROVIDER_SITE_OTHER): Payer: Self-pay | Admitting: Vascular Surgery

## 2023-11-08 ENCOUNTER — Ambulatory Visit (INDEPENDENT_AMBULATORY_CARE_PROVIDER_SITE_OTHER): Payer: Medicare PPO

## 2023-11-08 VITALS — BP 119/84 | HR 91 | Resp 18 | Ht 67.0 in | Wt 144.0 lb

## 2023-11-08 DIAGNOSIS — J439 Emphysema, unspecified: Secondary | ICD-10-CM

## 2023-11-08 DIAGNOSIS — E1169 Type 2 diabetes mellitus with other specified complication: Secondary | ICD-10-CM | POA: Diagnosis not present

## 2023-11-08 DIAGNOSIS — G458 Other transient cerebral ischemic attacks and related syndromes: Secondary | ICD-10-CM | POA: Diagnosis not present

## 2023-11-08 DIAGNOSIS — I10 Essential (primary) hypertension: Secondary | ICD-10-CM

## 2023-11-08 DIAGNOSIS — I739 Peripheral vascular disease, unspecified: Secondary | ICD-10-CM

## 2023-11-08 DIAGNOSIS — I6523 Occlusion and stenosis of bilateral carotid arteries: Secondary | ICD-10-CM

## 2023-11-09 NOTE — Progress Notes (Signed)
 Error

## 2023-11-12 ENCOUNTER — Ambulatory Visit

## 2023-11-12 VITALS — BP 119/84 | Ht 67.0 in | Wt 144.0 lb

## 2023-11-12 DIAGNOSIS — Z0000 Encounter for general adult medical examination without abnormal findings: Secondary | ICD-10-CM

## 2023-11-12 NOTE — Progress Notes (Signed)
 Because this visit was a virtual/telehealth visit,  certain criteria was not obtained, such a blood pressure, CBG if applicable, and timed get up and go. Any medications not marked as taking were not mentioned during the medication reconciliation part of the visit. Any vitals not documented were not able to be obtained due to this being a telehealth visit or patient was unable to self-report a recent blood pressure reading due to a lack of equipment at home via telehealth. Vitals that have been documented are verbally provided by the patient.   This visit was performed by a medical professional under my direct supervision. I was immediately available for consultation/collaboration. I have reviewed and agree with the Annual Wellness Visit documentation.  Subjective:   Angela Winters is a 73 y.o. who presents for a Medicare Wellness preventive visit.  As a reminder, Annual Wellness Visits don't include a physical exam, and some assessments may be limited, especially if this visit is performed virtually. We may recommend an in-person follow-up visit with your provider if needed.  Visit Complete: Virtual I connected with  Angela Winters on 11/12/23 by a audio enabled telemedicine application and verified that I am speaking with the correct person using two identifiers.  Patient Location: Home  Provider Location: Home Office  I discussed the limitations of evaluation and management by telemedicine. The patient expressed understanding and agreed to proceed.  Vital Signs: Because this visit was a virtual/telehealth visit, some criteria may be missing or patient reported. Any vitals not documented were not able to be obtained and vitals that have been documented are patient reported.  VideoDeclined- This patient declined Librarian, academic. Therefore the visit was completed with audio only.  Persons Participating in Visit: Patient.  AWV Questionnaire: Yes: Patient Medicare  AWV questionnaire was completed by the patient on 11/10/2023; I have confirmed that all information answered by patient is correct and no changes since this date.        Objective:    Today's Vitals   11/12/23 1110  BP: 119/84  Weight: 144 lb (65.3 kg)  Height: 5' 7 (1.702 m)   Body mass index is 22.55 kg/m.     11/12/2023   11:09 AM 11/03/2022   11:18 AM 03/05/2021   10:29 AM 02/19/2021    7:45 AM 11/20/2019    8:28 AM 08/29/2018   10:06 AM 08/25/2017    2:25 PM  Advanced Directives  Does Patient Have a Medical Advance Directive? No No  Yes No No No   Type of Theme park manager;Living will     Does patient want to make changes to medical advance directive?    No - Patient declined   No - Patient declined   Copy of Healthcare Power of Attorney in Chart?    No - copy requested     Would patient like information on creating a medical advance directive? No - Patient declined    No - Patient declined No - Patient declined       Information is confidential and restricted. Go to Review Flowsheets to unlock data.   Data saved with a previous flowsheet row definition    Current Medications (verified) Outpatient Encounter Medications as of 11/12/2023  Medication Sig   aspirin 81 MG tablet Take 81 mg by mouth daily.    cholecalciferol (VITAMIN D) 1000 UNITS tablet VITAMIN D, 1000UNIT (Oral Tablet)  1 Every Day for 0 days  Quantity: 0.00;  Refills: 0  Ordered :09-Jan-2010  [name] ;  Started 11-Sep-2008 Active   citalopram  (CELEXA ) 40 MG tablet Take 1 tablet (40 mg total) by mouth daily.   ezetimibe  (ZETIA ) 10 MG tablet Take 1 tablet (10 mg total) by mouth daily.   meloxicam  (MOBIC ) 15 MG tablet TAKE 1 TABLET (15 MG TOTAL) BY MOUTH DAILY. AS NEEDED (Patient taking differently: Take 15 mg by mouth as needed for pain. As needed)   metFORMIN  (GLUCOPHAGE -XR) 500 MG 24 hr tablet TAKE 1 TABLET BY MOUTH EVERY DAY WITH BREAKFAST   MULTIPLE VITAMIN PO 1  tablet daily.   rosuvastatin  (CRESTOR ) 40 MG tablet TAKE 1 TABLET BY MOUTH EVERY DAY   No facility-administered encounter medications on file as of 11/12/2023.    Allergies (verified) Codeine   History: Past Medical History:  Diagnosis Date   Chronic radicular low back pain    Hyperlipidemia    Hypertension    Sciatica of left side    Spinal stenosis at L4-L5 level    Past Surgical History:  Procedure Laterality Date   CATARACT EXTRACTION W/PHACO Right 02/19/2021   Procedure: CATARACT EXTRACTION PHACO AND INTRAOCULAR LENS PLACEMENT (IOC) RIGHT;  Surgeon: Mittie Gaskin, MD;  Location: [facility];  Service: Ophthalmology;  Laterality: Right;  9.68 01:21.2   CATARACT EXTRACTION W/PHACO Left 03/05/2021   Procedure: CATARACT EXTRACTION PHACO AND INTRAOCULAR LENS PLACEMENT (IOC) LEFT 6.90 01:10.4;  Surgeon: Mittie Gaskin, MD;  Location: [facility];  Service: Ophthalmology;  Laterality: Left;   REPLACEMENT TOTAL KNEE Left 07/24/2013   Dr. Mardee, [facility]   Family History  Problem Relation Age of Onset   Cancer Mother        Colon Cancer   Heart disease Father    Breast cancer Neg Hx    Social History   Socioeconomic History   Marital status: Married    Spouse name: Not on file   Number of children: 1   Years of education: Not on file   Highest education level: 12th grade  Occupational History   Occupation: retired  Tobacco Use   Smoking status: Every Day    Current packs/day: 0.75    Average packs/day: 0.8 packs/day for 50.0 years (37.5 ttl pk-yrs)    Types: Cigarettes   Smokeless tobacco: Never   Tobacco comments:    Started around age 63  Vaping Use   Vaping status: Never Used  Substance and Sexual Activity   Alcohol use: Yes    Alcohol/week: 2.0 - 3.0 standard drinks of alcohol    Types: 2 - 3 Shots of liquor per week    Comment: occasional   Drug use: No   Sexual activity: Not on file  Other Topics Concern   Not on file  Social  History Narrative   Not on file   Social Drivers of Health   Financial Resource Strain: Low Risk  (11/12/2023)   Overall Financial Resource Strain (CARDIA)    Difficulty of Paying Living Expenses: Not very hard  Food Insecurity: No Food Insecurity (11/12/2023)   Hunger Vital Sign    Worried About Running Out of Food in the Last Year: Never true    Ran Out of Food in the Last Year: Never true  Transportation Needs: No Transportation Needs (11/12/2023)   PRAPARE - Administrator, Civil Service (Medical): No    Lack of Transportation (Non-Medical): No  Physical Activity: Insufficiently Active (11/12/2023)   Exercise Vital Sign    Days of Exercise per Week:  4 days    Minutes of Exercise per Session: 30 min  Stress: No Stress Concern Present (11/12/2023)   [employer] of Occupational Health - Occupational Stress Questionnaire    Feeling of Stress: Not at all  Social Connections: Moderately Integrated (11/12/2023)   Social Connection and Isolation Panel    Frequency of Communication with Friends and Family: More than three times a week    Frequency of Social Gatherings with Friends and Family: Three times a week    Attends Religious Services: 1 to 4 times per year    Active Member of Clubs or Organizations: No    Attends Banker Meetings: Never    Marital Status: Married    Tobacco Counseling Ready to quit: Not Answered Counseling given: Not Answered Tobacco comments: Started around age 49    Clinical Intake:  Pre-visit preparation completed: Yes  Pain : No/denies pain     BMI - recorded: 22.55 Nutritional Status: BMI of 19-24  Normal Nutritional Risks: None Diabetes: No  Lab Results  Component Value Date   HGBA1C 6.4 (H) 08/19/2023   HGBA1C 6.4 (H) 11/04/2022   HGBA1C 6.7 (H) 08/03/2022     How often do you need to have someone help you when you read instructions, pamphlets, or other written materials from your doctor or pharmacy?:  1 - Never  Interpreter Needed?: No  Information entered by :: Rory Xiang,CMA   Activities of Daily Living     11/10/2023    2:11 PM 11/06/2023    5:10 PM  In your present state of health, do you have any difficulty performing the following activities:  Hearing? 0 0  Vision? 0 0  Difficulty concentrating or making decisions? 0 0  Walking or climbing stairs? 0 0  Dressing or bathing? 0 0  Doing errands, shopping? 0 0  Preparing Food and eating ? N N  Using the Toilet? N N  In the past six months, have you accidently leaked urine? N N  Do you have problems with loss of bowel control? N N  Managing your Medications? N N  Managing your Finances? N N  Housekeeping or managing your Housekeeping? N N    Patient Care Team: Edman Marsa PARAS, DO as PCP - General (Family Medicine) Darliss Rogue, MD as PCP - Cardiology (Cardiology) Mevelyn JONETTA Bathe, OD as Consulting Physician (Optometry) Schnier, Cordella MATSU, MD (Vascular Surgery)  I have updated your Care Teams any recent Medical Services you may have received from other providers in the past year.     Assessment:   This is a routine wellness examination for [place].  Hearing/Vision screen Hearing Screening - Comments:: No difficulties  Vision Screening - Comments:: Patient has no difficulties    Goals Addressed             This Visit's Progress    Patient Stated       To stay healthy to remain independent        Depression Screen     11/12/2023   11:13 AM 08/31/2023    9:03 AM 02/16/2023   10:33 AM 11/04/2022   10:06 AM 11/03/2022   11:16 AM 08/03/2022   10:10 AM 04/21/2022   11:26 AM  PHQ 2/9 Scores  PHQ - 2 Score 0 0 0 0 0 0 0  PHQ- 9 Score 0   0  1 1    Fall Risk     11/10/2023    2:11 PM 11/06/2023  5:10 PM 08/31/2023    9:03 AM 02/16/2023   10:33 AM 11/04/2022   10:06 AM  Fall Risk   Falls in the past year? 0 0 0 1 0  Number falls in past yr: 0 0  0 0  Injury with Fall? 0 0 0 1 0  Risk  for fall due to : No Fall Risks  No Fall Risks    Follow up Falls evaluation completed        MEDICARE RISK AT HOME:  Medicare Risk at Home Any stairs in or around the home?: (Patient-Rptd) No If so, are there any without handrails?: (Patient-Rptd) No Home free of loose throw rugs in walkways, pet beds, electrical cords, etc?: (Patient-Rptd) Yes Adequate lighting in your home to reduce risk of falls?: (Patient-Rptd) Yes Life alert?: (Patient-Rptd) No Use of a cane, walker or w/c?: (Patient-Rptd) No Grab bars in the bathroom?: (Patient-Rptd) No Shower chair or bench in shower?: (Patient-Rptd) Yes Elevated toilet seat or a handicapped toilet?: (Patient-Rptd) No  TIMED UP AND GO:  Was the test performed?  No  Cognitive Function: 6CIT completed        11/12/2023   11:14 AM 11/03/2022   11:21 AM 11/20/2019    8:33 AM 08/29/2018   10:09 AM 08/25/2017    2:28 PM  6CIT Screen  What Year? 0 points 0 points 0 points 0 points 0 points  What month? 0 points 0 points 0 points 0 points 0 points  What time? 0 points 0 points 0 points 0 points 0 points  Count back from 20 0 points 0 points 0 points 0 points 0 points  Months in reverse 0 points 0 points 0 points 0 points 0 points  Repeat phrase 0 points 0 points 0 points 0 points 2 points  Total Score 0 points 0 points 0 points 0 points 2 points    Immunizations Immunization History  Administered Date(s) Administered   Fluad Quad(high Dose 65+) 11/04/2018, 11/20/2019, 10/22/2021   Fluad Trivalent(High Dose 65+) 11/04/2022   INFLUENZA, HIGH DOSE SEASONAL PF 11/23/2016, 12/28/2017, 11/22/2020   Influenza,inj,Quad PF,6+ Mos 01/30/2015   PFIZER(Purple Top)SARS-COV-2 Vaccination 03/07/2019, 03/28/2019, 12/12/2019   PNEUMOCOCCAL CONJUGATE-20 08/31/2023   Pneumococcal Conjugate-13 01/30/2015   Pneumococcal Polysaccharide-23 08/21/2016   Tdap 09/17/2010   Zoster Recombinant(Shingrix) 04/29/2021, 08/07/2021    Screening Tests Health  Maintenance  Topic Date Due   OPHTHALMOLOGY EXAM  Never done   Colonoscopy  07/10/2019   DTaP/Tdap/Td (2 - Td or Tdap) 09/16/2020   DEXA SCAN  12/07/2020   Influenza Vaccine  08/27/2023   COVID-19 Vaccine (4 - 2025-26 season) 09/27/2023   HEMOGLOBIN A1C  02/19/2024   Lung Cancer Screening  07/21/2024   Diabetic kidney evaluation - eGFR measurement  08/18/2024   Diabetic kidney evaluation - Urine ACR  08/18/2024   Medicare Annual Wellness (AWV)  11/11/2024   Mammogram  06/21/2025   Pneumococcal Vaccine: 50+ Years  Completed   Hepatitis C Screening  Completed   Zoster Vaccines- Shingrix  Completed   Meningococcal B Vaccine  Aged Out    Health Maintenance Items Addressed:patient declined   Additional Screening:  Vision Screening: Recommended annual ophthalmology exams for early detection of glaucoma and other disorders of the eye. Is the patient up to date with their annual eye exam?  No  Who is the provider or what is the name of the office in which the patient attends annual eye exams?   Dental Screening: Recommended annual dental exams for  proper oral hygiene  Community Resource Referral / Chronic Care Management: CRR required this visit?  No   CCM required this visit?  No   Plan:    I have personally reviewed and noted the following in the patient's chart:   Medical and social history Use of alcohol, tobacco or illicit drugs  Current medications and supplements including opioid prescriptions. Patient is not currently taking opioid prescriptions. Functional ability and status Nutritional status Physical activity Advanced directives List of other physicians Hospitalizations, surgeries, and ER visits in previous 12 months Vitals Screenings to include cognitive, depression, and falls Referrals and appointments  In addition, I have reviewed and discussed with patient certain preventive protocols, quality metrics, and best practice recommendations. A written  personalized care plan for preventive services as well as general preventive health recommendations were provided to patient.   Lyle MARLA Right, [place]   11/12/2023   After Visit Summary: (MyChart) Due to this being a telephonic visit, the after visit summary with patients personalized plan was offered to patient via MyChart   Notes: Nothing significant to report at this time.

## 2023-11-12 NOTE — Patient Instructions (Signed)
 Angela Winters,  Thank you for taking the time for your Medicare Wellness Visit. I appreciate your continued commitment to your health goals. Please review the care plan we discussed, and feel free to reach out if I can assist you further.  Medicare recommends these wellness visits once per year to help you and your care team stay ahead of potential health issues. These visits are designed to focus on prevention, allowing your provider to concentrate on managing your acute and chronic conditions during your regular appointments.  Please note that Annual Wellness Visits do not include a physical exam. Some assessments may be limited, especially if the visit was conducted virtually. If needed, we may recommend a separate in-person follow-up with your provider.  Ongoing Care Seeing your primary care provider every 3 to 6 months helps us  monitor your health and provide consistent, personalized care.   Referrals If a referral was made during today's visit and you haven't received any updates within two weeks, please contact the referred provider directly to check on the status.  Recommended Screenings:  Health Maintenance  Topic Date Due   Eye exam for diabetics  Never done   Colon Cancer Screening  07/10/2019   DTaP/Tdap/Td vaccine (2 - Td or Tdap) 09/16/2020   DEXA scan (bone density measurement)  12/07/2020   Flu Shot  08/27/2023   COVID-19 Vaccine (4 - 2025-26 season) 09/27/2023   Hemoglobin A1C  02/19/2024   Screening for Lung Cancer  07/21/2024   Yearly kidney function blood test for diabetes  08/18/2024   Yearly kidney health urinalysis for diabetes  08/18/2024   Medicare Annual Wellness Visit  11/11/2024   Breast Cancer Screening  06/21/2025   Pneumococcal Vaccine for age over 26  Completed   Hepatitis C Screening  Completed   Zoster (Shingles) Vaccine  Completed   Meningitis B Vaccine  Aged Out       11/12/2023   11:09 AM  Advanced Directives  Does Patient Have a Medical  Advance Directive? No  Would patient like information on creating a medical advance directive? No - Patient declined   Advance Care Planning is important because it: Ensures you receive medical care that aligns with your values, goals, and preferences. Provides guidance to your family and loved ones, reducing the emotional burden of decision-making during critical moments.  Vision: Annual vision screenings are recommended for early detection of glaucoma, cataracts, and diabetic retinopathy. These exams can also reveal signs of chronic conditions such as diabetes and high blood pressure.  Dental: Annual dental screenings help detect early signs of oral cancer, gum disease, and other conditions linked to overall health, including heart disease and diabetes.  Please see the attached documents for additional preventive care recommendations.

## 2023-11-16 ENCOUNTER — Encounter (INDEPENDENT_AMBULATORY_CARE_PROVIDER_SITE_OTHER): Payer: Self-pay | Admitting: Vascular Surgery

## 2023-11-16 DIAGNOSIS — G458 Other transient cerebral ischemic attacks and related syndromes: Secondary | ICD-10-CM | POA: Insufficient documentation

## 2023-11-16 NOTE — Progress Notes (Signed)
 MRN : [id number]  Angela Winters is a 73 y.o. (02-13-1950) female who presents with chief complaint of check carotid arteries.  History of Present Illness:   The patient is seen for evaluation of diminished pulses in the left arm. Patient denies pain in the left arm.  She denies left upper extremity claudication symptoms.  She denies ulceration or painful discoloration consistent with embolization of the fingertips   The patient denies rest pain or dangling of an extremity off the side of the bed during the night for relief. No open wounds or sores at this time. No prior interventions or surgeries.   No history of back problems or DJD of the lumbar sacral spine but she does suffer from DJD ans is s/p left TKR.    The patient denies lower extremity rest pain symptoms.  No new ulcers or wounds of the left hand or of the foot.   The patient's blood pressure has been stable and relatively well controlled. The patient denies amaurosis fugax or recent TIA symptoms. There are no recent neurological changes noted. The patient denies history of DVT, PE or superficial thrombophlebitis.    Previous ABI 1.22 on the right and 1.23 on the left.  The patient has triphasic tibial artery waveforms bilaterally with good toe waveforms bilaterally.   Today the patient has a carotid artery duplex which reveals RICA 40-59%(low end) and LICA 40-59%.  The right vertebral artery has antegrade flow while the left vertebral artery demonstrates bidirectional flow.  Left subclavian steal syndrome is again noted  Current Meds  Medication Sig   aspirin 81 MG tablet Take 81 mg by mouth daily.    cholecalciferol (VITAMIN D) 1000 UNITS tablet VITAMIN D, 1000UNIT (Oral Tablet)  1 Every Day for 0 days  Quantity: 0.00;  Refills: 0   Ordered :09-Jan-2010  [name] ;  Started 11-Sep-2008 Active   citalopram  (CELEXA ) 40 MG tablet Take 1 tablet (40 mg total) by mouth daily.   ezetimibe  (ZETIA )  10 MG tablet Take 1 tablet (10 mg total) by mouth daily.   meloxicam  (MOBIC ) 15 MG tablet TAKE 1 TABLET (15 MG TOTAL) BY MOUTH DAILY. AS NEEDED (Patient taking differently: Take 15 mg by mouth as needed for pain. As needed)   metFORMIN  (GLUCOPHAGE -XR) 500 MG 24 hr tablet TAKE 1 TABLET BY MOUTH EVERY DAY WITH BREAKFAST   MULTIPLE VITAMIN PO 1 tablet daily.   rosuvastatin  (CRESTOR ) 40 MG tablet TAKE 1 TABLET BY MOUTH EVERY DAY    Past Medical History:  Diagnosis Date   Chronic radicular low back pain    Hyperlipidemia    Hypertension    Sciatica of left side    Spinal stenosis at L4-L5 level     Past Surgical History:  Procedure Laterality Date   CATARACT EXTRACTION W/PHACO Right 02/19/2021   Procedure: CATARACT EXTRACTION PHACO AND INTRAOCULAR LENS PLACEMENT (IOC) RIGHT;  Surgeon: Mittie Gaskin, MD;  Location: [facility];  Service: Ophthalmology;  Laterality: Right;  9.68 01:21.2   CATARACT EXTRACTION W/PHACO Left 03/05/2021   Procedure: CATARACT EXTRACTION PHACO AND INTRAOCULAR LENS PLACEMENT (IOC) LEFT 6.90 01:10.4;  Surgeon: Mittie Gaskin, MD;  Location: [facility];  Service: Ophthalmology;  Laterality: Left;   REPLACEMENT TOTAL KNEE Left 07/24/2013   Dr. Mardee, [facility]    Social History Social History   Tobacco Use  Smoking status: Every Day    Current packs/day: 0.75    Average packs/day: 0.8 packs/day for 50.0 years (37.5 ttl pk-yrs)    Types: Cigarettes   Smokeless tobacco: Never   Tobacco comments:    Started around age 61  Vaping Use   Vaping status: Never Used  Substance Use Topics   Alcohol use: Yes    Alcohol/week: 2.0 - 3.0 standard drinks of alcohol    Types: 2 - 3 Shots of liquor per week    Comment: occasional   Drug use: No    Family History Family History  Problem Relation Age of Onset   Cancer Mother        Colon Cancer   Heart disease Father    Breast cancer Neg Hx     Allergies  Allergen Reactions   Codeine  Nausea Only     REVIEW OF SYSTEMS (Negative unless checked)  Constitutional: [] Weight loss  [] Fever  [] Chills Cardiac: [] Chest pain   [] Chest pressure   [] Palpitations   [] Shortness of breath when laying flat   [] Shortness of breath with exertion. Vascular:  [x] Pain in legs with walking   [] Pain in legs at rest  [] History of DVT   [] Phlebitis   [] Swelling in legs   [] Varicose veins   [] Non-healing ulcers Pulmonary:   [] Uses home oxygen   [] Productive cough   [] Hemoptysis   [] Wheeze  [] COPD   [] Asthma Neurologic:  [] Dizziness   [] Seizures   [] History of stroke   [] History of TIA  [] Aphasia   [] Vissual changes   [] Weakness or numbness in arm   [] Weakness or numbness in leg Musculoskeletal:   [] Joint swelling   [] Joint pain   [] Low back pain Hematologic:  [] Easy bruising  [] Easy bleeding   [] Hypercoagulable state   [] Anemic Gastrointestinal:  [] Diarrhea   [] Vomiting  [] Gastroesophageal reflux/heartburn   [] Difficulty swallowing. Genitourinary:  [] Chronic kidney disease   [] Difficult urination  [] Frequent urination   [] Blood in urine Skin:  [] Rashes   [] Ulcers  Psychological:  [] History of anxiety   []  History of major depression.  Physical Examination  Vitals:   11/08/23 1126  BP: 119/84  Pulse: 91  Resp: 18  Weight: 144 lb (65.3 kg)  Height: 5' 7 (1.702 m)   Body mass index is 22.55 kg/m. Gen: WD/WN, NAD Head: [place]/AT, No temporalis wasting.  Ear/Nose/Throat: Hearing grossly intact, nares w/o erythema or drainage Eyes: PER, EOMI, sclera nonicteric.  Neck: Supple, no masses.  No bruit or JVD.  Pulmonary:  Good air movement, no audible wheezing, no use of accessory muscles.  Cardiac: RRR, normal S1, S2, no Murmurs. Vascular:  carotid bruit noted Vessel Right Left  Radial Palpable Trace  Carotid  Palpable  Palpable  Subclav  Palpable Not palpable  Gastrointestinal: soft, non-distended. No guarding/no peritoneal signs.  Musculoskeletal: M/S 5/5 throughout.  No visible deformity.   Neurologic: CN 2-12 intact. Pain and light touch intact in extremities.  Symmetrical.  Speech is fluent. Motor exam as listed above. Psychiatric: Judgment intact, Mood & affect appropriate for pt's clinical situation. Dermatologic: No rashes or ulcers noted.  No changes consistent with cellulitis.   CBC Lab Results  Component Value Date   WBC 8.8 08/19/2023   HGB 14.8 08/19/2023   HCT 45.9 (H) 08/19/2023   MCV 97.7 08/19/2023   PLT 179 08/19/2023    BMET    Component Value Date/Time   NA 140 08/19/2023 0759   NA 149 (H) 04/21/2022 1308   NA  135 (L) 07/26/2013 0530   K 4.1 08/19/2023 0759   K 4.4 07/26/2013 0530   CL 109 08/19/2023 0759   CL 104 07/26/2013 0530   CO2 24 08/19/2023 0759   CO2 25 07/26/2013 0530   GLUCOSE 126 (H) 08/19/2023 0759   GLUCOSE 105 (H) 07/26/2013 0530   BUN 16 08/19/2023 0759   BUN 12 04/21/2022 1308   BUN 11 07/26/2013 0530   CREATININE 0.66 08/19/2023 0759   CALCIUM  9.1 08/19/2023 0759   CALCIUM  8.1 (L) 07/26/2013 0530   GFRNONAA 77 11/20/2019 1015   GFRNONAA >60 07/26/2013 0530   GFRAA 89 11/20/2019 1015   GFRAA >60 07/26/2013 0530   CrCl cannot be calculated (Patient's most recent lab result is older than the maximum 21 days allowed.).  COAG Lab Results  Component Value Date   INR 0.9 07/12/2013    Radiology VAS US  CAROTID Result Date: 11/08/2023 Carotid Arterial Duplex Study Patient Name:  NEALIE MCHATTON  Date of Exam:   11/08/2023 Medical Rec #: [id number]     Accession #:    [id number] Date of Birth: March 06, 1950     Patient Gender: F Patient Age:   22 years Exam Location:  [facility] Procedure:      VAS US  CAROTID Referring Phys: CORDELLA SHAWL --------------------------------------------------------------------------------  Indications: Carotid artery disease. Performing Technologist: Elsie Churn RT, RDMS, RVT  Examination Guidelines: A complete evaluation includes B-mode imaging, spectral Doppler, color Doppler, and  power Doppler as needed of all accessible portions of each vessel. Bilateral testing is considered an integral part of a complete examination. Limited examinations for reoccurring indications may be performed as noted.  Right Carotid Findings: +----------+--------+--------+--------+------------------+--------+           PSV cm/sEDV cm/sStenosisPlaque DescriptionComments +----------+--------+--------+--------+------------------+--------+ CCA Prox  44      13                                         +----------+--------+--------+--------+------------------+--------+ CCA Mid   57      20              heterogenous               +----------+--------+--------+--------+------------------+--------+ CCA Distal49      16                                         +----------+--------+--------+--------+------------------+--------+ ICA Prox  115     32      40-59%  heterogenous               +----------+--------+--------+--------+------------------+--------+ ICA Mid   73      24                                         +----------+--------+--------+--------+------------------+--------+ ICA Distal59      21                                         +----------+--------+--------+--------+------------------+--------+ ECA       417     113     >50%    heterogenous               +----------+--------+--------+--------+------------------+--------+ +----------+--------+-------+----------------+-------------------+  PSV cm/sEDV cmsDescribe        Arm Pressure (mmHG) +----------+--------+-------+----------------+-------------------+ [id number]             Multiphasic, WNL                    +----------+--------+-------+----------------+-------------------+ +---------+--------+--+--------+---------+ VertebralPSV cm/s45EDV cm/sAntegrade +---------+--------+--+--------+---------+ Left Carotid Findings:  +----------+--------+--------+--------+------------------+--------+           PSV cm/sEDV cm/sStenosisPlaque DescriptionComments +----------+--------+--------+--------+------------------+--------+ CCA Prox  135     14                                         +----------+--------+--------+--------+------------------+--------+ CCA Mid   111     15              heterogenous               +----------+--------+--------+--------+------------------+--------+ CCA Distal136     19                                         +----------+--------+--------+--------+------------------+--------+ ICA Prox  215     22      40-59%  heterogenous               +----------+--------+--------+--------+------------------+--------+ ICA Mid   139     18                                         +----------+--------+--------+--------+------------------+--------+ ICA Distal93      19                                         +----------+--------+--------+--------+------------------+--------+ ECA       423     24      >50%    heterogenous               +----------+--------+--------+--------+------------------+--------+ +----------+--------+--------+----------+-------------------+           PSV cm/sEDV cm/sDescribe  Arm Pressure (mmHG) +----------+--------+--------+----------+-------------------+ [id number]              Monophasic                    +----------+--------+--------+----------+-------------------+ +---------+--------+--+--------+----------+ VertebralPSV cm/s60EDV cm/sRetrograde +---------+--------+--+--------+----------+  Summary: Right Carotid: Velocities in the right ICA are consistent with a low-end 40-59%                stenosis. The ECA appears >50% stenosed. Left Carotid: Velocities in the left ICA are consistent with a high-end 40-59%               stenosis. The ECA appears >50% stenosed. Known left subclavian               steal. *See table(s) above for  measurements and observations.  Electronically signed by Cordella Shawl MD on 11/08/2023 at 4:55:44 PM.    Final      Assessment/Plan 1. Carotid atherosclerosis, bilateral (Primary) Recommend:   Given the patient's asymptomatic subcritical stenosis no further invasive testing or surgery at this time.   Today the patient has a carotid artery duplex which reveals RICA 40-59% (lower end) and LICA 40-59%.  The right vertebral artery has antegrade flow while the left vertebral artery demonstrates retrograde flow.  Left subclavian steal syndrome is again noted    There is also noted left subclavian artery stenosis as well however the patient has no signs symptoms of worsening currently.    Continue antiplatelet therapy as prescribed Continue management of CAD, HTN and Hyperlipidemia Healthy heart diet,  encouraged exercise at least 4 times per week.   Follow up in 12 months with duplex ultrasound and physical exam. - VAS US  CAROTID; Future  2. Subclavian steal syndrome Recommend:   Given the patient's asymptomatic subcritical stenosis no further invasive testing or surgery at this time.   Today the patient has a carotid artery duplex which reveals RICA 40-59% (lower end) and LICA 40-59%.  The right vertebral artery has antegrade flow while the left vertebral artery demonstrates retrograde flow.  Left subclavian steal syndrome is again noted    There is also noted left subclavian artery stenosis as well however the patient has no signs symptoms of worsening currently.    Continue antiplatelet therapy as prescribed Continue management of CAD, HTN and Hyperlipidemia Healthy heart diet,  encouraged exercise at least 4 times per week.   Follow up in 12 months with duplex ultrasound and physical exam. - VAS US  CAROTID; Future  3. PAD (peripheral artery disease)  Recommend:  The patient has evidence of atherosclerosis of the lower extremities with claudication.  The patient does not voice  lifestyle limiting changes at this point in time.  Noninvasive studies do not suggest clinically significant change.  No invasive studies, angiography or surgery at this time The patient should continue walking and begin a more formal exercise program.  The patient should continue antiplatelet therapy and aggressive treatment of the lipid abnormalities  No changes in the patient's medications at this time  Continued surveillance is indicated as atherosclerosis is likely to progress with time.    The patient will continue follow up with noninvasive studies as ordered.   4. Primary hypertension Continue antihypertensive medications as already ordered, these medications have been reviewed and there are no changes at this time.  5. Pulmonary emphysema (HCC) Continue pulmonary medications and aerosols as already ordered, these medications have been reviewed and there are no changes at this time.   6. Type 2 diabetes mellitus with other specified complication, without long-term current use of insulin (HCC) Continue hypoglycemic medications as already ordered, these medications have been reviewed and there are no changes at this time.  Hgb A1C to be monitored as already arranged by primary service    Cordella Shawl, MD  11/16/2023 1:02 PM

## 2024-01-04 ENCOUNTER — Other Ambulatory Visit: Payer: Self-pay | Admitting: Family Medicine

## 2024-01-04 DIAGNOSIS — F419 Anxiety disorder, unspecified: Secondary | ICD-10-CM

## 2024-01-04 DIAGNOSIS — E7800 Pure hypercholesterolemia, unspecified: Secondary | ICD-10-CM

## 2024-01-05 NOTE — Telephone Encounter (Signed)
 Requested Prescriptions  Pending Prescriptions Disp Refills   rosuvastatin  (CRESTOR ) 40 MG tablet [Pharmacy Med Name: ROSUVASTATIN  CALCIUM  40 MG TAB] 90 tablet 0    Sig: TAKE 1 TABLET BY MOUTH EVERY DAY     Cardiovascular:  Antilipid - Statins 2 Failed - 01/05/2024  3:57 PM      Failed - Lipid Panel in normal range within the last 12 months    Cholesterol, Total  Date Value Ref Range Status  04/21/2022 133 100 - 199 mg/dL Final   Cholesterol  Date Value Ref Range Status  08/19/2023 139 <200 mg/dL Final   LDL Cholesterol (Calc)  Date Value Ref Range Status  08/19/2023 44 mg/dL (calc) Final    Comment:    Reference range: <100 . Desirable range <100 mg/dL for primary prevention;   <70 mg/dL for patients with CHD or diabetic patients  with > or = 2 CHD risk factors. SABRA LDL-C is now calculated using the Martin-Hopkins  calculation, which is a validated novel method providing  better accuracy than the Friedewald equation in the  estimation of LDL-C.  Gladis APPLETHWAITE et al. SANDREA. 7986;689(80): 2061-2068  (http://education.[facility].com/faq/FAQ164)    LDL Direct  Date Value Ref Range Status  04/21/2022 39 0 - 99 mg/dL Final   HDL  Date Value Ref Range Status  08/19/2023 75 > OR = 50 mg/dL Final  96/73/7975 62 >60 mg/dL Final   Triglycerides  Date Value Ref Range Status  08/19/2023 116 <150 mg/dL Final         Passed - Cr in normal range and within 360 days    Creat  Date Value Ref Range Status  08/19/2023 0.66 0.60 - 1.00 mg/dL Final   Creatinine, Urine  Date Value Ref Range Status  08/19/2023 195 20 - 275 mg/dL Final         Passed - Patient is not pregnant      Passed - Valid encounter within last 12 months    Recent Outpatient Visits           4 months ago Annual physical exam   [place] [facility] [facility] [place] PARAS, [place]   10 months ago Acute cystitis with hematuria   [place] [facility] [place],  Marsa PARAS, DO               citalopram  (CELEXA ) 40 MG tablet [Pharmacy Med Name: CITALOPRAM  HBR 40 MG TABLET] 90 tablet 0    Sig: TAKE 1 TABLET BY MOUTH EVERY DAY     Psychiatry:  Antidepressants - SSRI Passed - 01/05/2024  3:57 PM      Passed - Valid encounter within last 6 months    Recent Outpatient Visits           4 months ago Annual physical exam   [place] [facility] [place] Marsa PARAS, DO   10 months ago Acute cystitis with hematuria   [place] [facility] [place], Marsa PARAS, [place]

## 2024-01-11 ENCOUNTER — Other Ambulatory Visit: Payer: Self-pay | Admitting: Family Medicine

## 2024-01-11 ENCOUNTER — Ambulatory Visit

## 2024-01-11 DIAGNOSIS — I340 Nonrheumatic mitral (valve) insufficiency: Secondary | ICD-10-CM

## 2024-01-11 DIAGNOSIS — E119 Type 2 diabetes mellitus without complications: Secondary | ICD-10-CM

## 2024-01-11 LAB — ECHOCARDIOGRAM COMPLETE
AR max vel: 2.24 cm2
AV Area VTI: 2.08 cm2
AV Area mean vel: 1.94 cm2
AV Mean grad: 4 mmHg
AV Peak grad: 5.6 mmHg
Ao pk vel: 1.18 m/s
Area-P 1/2: 4.63 cm2
S' Lateral: 2.8 cm

## 2024-01-14 NOTE — Telephone Encounter (Signed)
 Requested Prescriptions  Pending Prescriptions Disp Refills   metFORMIN  (GLUCOPHAGE -XR) 500 MG 24 hr tablet [Pharmacy Med Name: METFORMIN  HCL ER 500 MG TABLET] 90 tablet 0    Sig: TAKE 1 TABLET BY MOUTH EVERY DAY WITH BREAKFAST     Endocrinology:  Diabetes - Biguanides Failed - 01/14/2024 10:11 AM      Failed - eGFR in normal range and within 360 days    EGFR (African American)  Date Value Ref Range Status  07/26/2013 >60  Final   GFR calc Af Amer  Date Value Ref Range Status  11/20/2019 89 >59 mL/min/1.73 Final    Comment:    **In accordance with recommendations from the NKF-ASN Task force,**   [facility] is in the process of updating its eGFR calculation to the   2021 CKD-EPI creatinine equation that estimates kidney function   without a race variable.    EGFR (Non-African Amer.)  Date Value Ref Range Status  07/26/2013 >60  Final    Comment:    eGFR values <73mL/min/1.73 m2 may be an indication of chronic kidney disease (CKD). Calculated eGFR is useful in patients with stable renal function. The eGFR calculation will not be reliable in acutely ill patients when serum creatinine is changing rapidly. It is not useful in  patients on dialysis. The eGFR calculation may not be applicable to patients at the low and high extremes of body sizes, pregnant women, and vegetarians. POTASSIUM - Slight hemolysis, interpret results with  - caution...tpl    GFR calc non Af Amer  Date Value Ref Range Status  11/20/2019 77 >59 mL/min/1.73 Final   eGFR  Date Value Ref Range Status  04/21/2022 76 >59 mL/min/1.73 Final         Failed - B12 Level in normal range and within 720 days    No results found for: VITAMINB12       Passed - Cr in normal range and within 360 days    Creat  Date Value Ref Range Status  08/19/2023 0.66 0.60 - 1.00 mg/dL Final   Creatinine, Urine  Date Value Ref Range Status  08/19/2023 195 20 - 275 mg/dL Final         Passed - HBA1C is between 0 and 7.9  and within 180 days    Hgb A1c MFr Bld  Date Value Ref Range Status  08/19/2023 6.4 (H) <5.7 % Final    Comment:    For someone without known diabetes, a hemoglobin  A1c value between 5.7% and 6.4% is consistent with prediabetes and should be confirmed with a  follow-up test. . For someone with known diabetes, a value <7% indicates that their diabetes is well controlled. A1c targets should be individualized based on duration of diabetes, age, comorbid conditions, and other considerations. . This assay result is consistent with an increased risk of diabetes. . Currently, no consensus exists regarding use of hemoglobin A1c for diagnosis of diabetes for children. SABRA Amy - Valid encounter within last 6 months    Recent Outpatient Visits           4 months ago Annual physical exam   [place] [facility] [place] Marsa PARAS, DO   10 months ago Acute cystitis with hematuria   [place] [facility] [place], Marsa PARAS, DO              Passed - CBC within normal limits and completed in  the last 12 months    WBC  Date Value Ref Range Status  08/19/2023 8.8 3.8 - 10.8 Thousand/uL Final   RBC  Date Value Ref Range Status  08/19/2023 4.70 3.80 - 5.10 Million/uL Final   Hemoglobin  Date Value Ref Range Status  08/19/2023 14.8 11.7 - 15.5 g/dL Final  91/98/7975 85.2 11.1 - 15.9 g/dL Final   HCT  Date Value Ref Range Status  08/19/2023 45.9 (H) 35.0 - 45.0 % Final   Hematocrit  Date Value Ref Range Status  08/27/2022 44.5 34.0 - 46.6 % Final   MCHC  Date Value Ref Range Status  08/19/2023 32.2 32.0 - 36.0 g/dL Final    Comment:    For adults, a slight decrease in the calculated MCHC value (in the range of 30 to 32 g/dL) is most likely not clinically significant; however, it should be interpreted with caution in correlation with other red cell parameters and the patient's clinical condition.    [facility]   Date Value Ref Range Status  08/19/2023 31.5 27.0 - 33.0 pg Final   MCV  Date Value Ref Range Status  08/19/2023 97.7 80.0 - 100.0 fL Final  04/21/2022 95 79 - 97 fL Final  07/12/2013 98 80 - 100 fL Final   No results found for: PLTCOUNTKUC, LABPLAT, POCPLA RDW  Date Value Ref Range Status  08/19/2023 13.5 11.0 - 15.0 % Final  04/21/2022 12.9 11.7 - 15.4 % Final  07/12/2013 14.0 11.5 - 14.5 % Final

## 2024-01-18 ENCOUNTER — Ambulatory Visit: Payer: Self-pay | Admitting: Cardiology

## 2024-03-02 ENCOUNTER — Ambulatory Visit: Admitting: Family Medicine

## 2024-03-16 ENCOUNTER — Ambulatory Visit: Admitting: Family Medicine

## 2024-11-06 ENCOUNTER — Encounter (INDEPENDENT_AMBULATORY_CARE_PROVIDER_SITE_OTHER)

## 2024-11-06 ENCOUNTER — Ambulatory Visit (INDEPENDENT_AMBULATORY_CARE_PROVIDER_SITE_OTHER): Admitting: Vascular Surgery
